# Patient Record
Sex: Female | Born: 1945
Health system: Southern US, Community
[De-identification: ages and names within clinical notes are randomized; demographics above are authoritative.]

## PROBLEM LIST (undated history)

## (undated) DIAGNOSIS — R011 Cardiac murmur, unspecified: Secondary | ICD-10-CM

## (undated) DIAGNOSIS — Z8739 Personal history of other diseases of the musculoskeletal system and connective tissue: Secondary | ICD-10-CM

## (undated) DIAGNOSIS — G8929 Other chronic pain: Secondary | ICD-10-CM

## (undated) DIAGNOSIS — R202 Paresthesia of skin: Secondary | ICD-10-CM

## (undated) DIAGNOSIS — R0602 Shortness of breath: Secondary | ICD-10-CM

## (undated) DIAGNOSIS — M1990 Unspecified osteoarthritis, unspecified site: Secondary | ICD-10-CM

## (undated) DIAGNOSIS — K219 Gastro-esophageal reflux disease without esophagitis: Secondary | ICD-10-CM

## (undated) DIAGNOSIS — I4891 Unspecified atrial fibrillation: Secondary | ICD-10-CM

## (undated) DIAGNOSIS — I739 Peripheral vascular disease, unspecified: Secondary | ICD-10-CM

## (undated) DIAGNOSIS — I8390 Asymptomatic varicose veins of unspecified lower extremity: Secondary | ICD-10-CM

## (undated) DIAGNOSIS — E538 Deficiency of other specified B group vitamins: Secondary | ICD-10-CM

## (undated) DIAGNOSIS — G589 Mononeuropathy, unspecified: Secondary | ICD-10-CM

## (undated) DIAGNOSIS — R0600 Dyspnea, unspecified: Secondary | ICD-10-CM

## (undated) DIAGNOSIS — T753XXA Motion sickness, initial encounter: Secondary | ICD-10-CM

## (undated) DIAGNOSIS — R0683 Snoring: Secondary | ICD-10-CM

## (undated) DIAGNOSIS — Z8679 Personal history of other diseases of the circulatory system: Secondary | ICD-10-CM

## (undated) DIAGNOSIS — R200 Anesthesia of skin: Secondary | ICD-10-CM

## (undated) DIAGNOSIS — M545 Low back pain: Secondary | ICD-10-CM

## (undated) DIAGNOSIS — E039 Hypothyroidism, unspecified: Secondary | ICD-10-CM

## (undated) DIAGNOSIS — M6740 Ganglion, unspecified site: Secondary | ICD-10-CM

## (undated) DIAGNOSIS — N3941 Urge incontinence: Secondary | ICD-10-CM

## (undated) DIAGNOSIS — I872 Venous insufficiency (chronic) (peripheral): Secondary | ICD-10-CM

## (undated) DIAGNOSIS — D649 Anemia, unspecified: Secondary | ICD-10-CM

## (undated) DIAGNOSIS — S46101A Unspecified injury of muscle, fascia and tendon of long head of biceps, right arm, initial encounter: Secondary | ICD-10-CM

## (undated) DIAGNOSIS — I499 Cardiac arrhythmia, unspecified: Secondary | ICD-10-CM

## (undated) DIAGNOSIS — S149XXA Injury of unspecified nerves of neck, initial encounter: Secondary | ICD-10-CM

## (undated) DIAGNOSIS — Z8719 Personal history of other diseases of the digestive system: Secondary | ICD-10-CM

## (undated) DIAGNOSIS — R29898 Other symptoms and signs involving the musculoskeletal system: Secondary | ICD-10-CM

## (undated) HISTORY — PX: OTHER SURGICAL HISTORY: SHX169

## (undated) HISTORY — PX: SPINE SURGERY: SHX786

## (undated) HISTORY — PX: DILATION AND CURETTAGE OF UTERUS: SHX78

## (undated) HISTORY — DX: Asymptomatic varicose veins of unspecified lower extremity: I83.90

## (undated) HISTORY — PX: LUMBAR LAMINECTOMY: SHX95

## (undated) HISTORY — PX: ABDOMINAL HYSTERECTOMY: SHX81

## (undated) HISTORY — PX: TONSILLECTOMY: SUR1361

---
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
## 1969-08-15 HISTORY — PX: OTHER SURGICAL HISTORY: SHX169

## 1979-08-16 HISTORY — PX: OTHER SURGICAL HISTORY: SHX169

## 2002-03-14 ENCOUNTER — Encounter: Payer: Self-pay | Admitting: Family Medicine

## 2002-03-14 ENCOUNTER — Encounter: Admission: RE | Admit: 2002-03-14 | Discharge: 2002-03-14 | Payer: Self-pay | Admitting: Family Medicine

## 2002-05-11 ENCOUNTER — Encounter: Payer: Self-pay | Admitting: *Deleted

## 2002-05-13 ENCOUNTER — Encounter (INDEPENDENT_AMBULATORY_CARE_PROVIDER_SITE_OTHER): Payer: Self-pay | Admitting: Specialist

## 2002-05-13 ENCOUNTER — Inpatient Hospital Stay (HOSPITAL_COMMUNITY): Admission: RE | Admit: 2002-05-13 | Discharge: 2002-05-14 | Payer: Self-pay | Admitting: *Deleted

## 2002-05-13 ENCOUNTER — Encounter: Payer: Self-pay | Admitting: *Deleted

## 2002-05-13 HISTORY — PX: LAPAROSCOPIC CHOLECYSTECTOMY: SUR755

## 2002-07-20 ENCOUNTER — Encounter: Payer: Self-pay | Admitting: Family Medicine

## 2002-07-20 ENCOUNTER — Encounter: Admission: RE | Admit: 2002-07-20 | Discharge: 2002-07-20 | Payer: Self-pay | Admitting: Family Medicine

## 2003-10-26 ENCOUNTER — Encounter: Admission: RE | Admit: 2003-10-26 | Discharge: 2003-10-26 | Payer: Self-pay | Admitting: Family Medicine

## 2004-01-17 ENCOUNTER — Other Ambulatory Visit: Admission: RE | Admit: 2004-01-17 | Discharge: 2004-01-17 | Payer: Self-pay | Admitting: Family Medicine

## 2004-01-30 ENCOUNTER — Encounter: Admission: RE | Admit: 2004-01-30 | Discharge: 2004-01-30 | Payer: Self-pay | Admitting: Family Medicine

## 2005-04-25 ENCOUNTER — Inpatient Hospital Stay (HOSPITAL_COMMUNITY): Admission: RE | Admit: 2005-04-25 | Discharge: 2005-04-27 | Payer: Self-pay | Admitting: Obstetrics and Gynecology

## 2005-04-25 ENCOUNTER — Encounter (INDEPENDENT_AMBULATORY_CARE_PROVIDER_SITE_OTHER): Payer: Self-pay | Admitting: Specialist

## 2005-04-25 HISTORY — PX: OTHER SURGICAL HISTORY: SHX169

## 2005-08-16 ENCOUNTER — Emergency Department (HOSPITAL_COMMUNITY): Admission: EM | Admit: 2005-08-16 | Discharge: 2005-08-16 | Payer: Self-pay | Admitting: Emergency Medicine

## 2007-09-18 ENCOUNTER — Emergency Department (HOSPITAL_COMMUNITY): Admission: EM | Admit: 2007-09-18 | Discharge: 2007-09-18 | Payer: Self-pay | Admitting: Emergency Medicine

## 2007-09-28 ENCOUNTER — Encounter: Admission: RE | Admit: 2007-09-28 | Discharge: 2007-09-28 | Payer: Self-pay | Admitting: Family Medicine

## 2007-11-21 ENCOUNTER — Encounter: Admission: RE | Admit: 2007-11-21 | Discharge: 2007-11-21 | Payer: Self-pay | Admitting: Family Medicine

## 2008-03-15 HISTORY — PX: CARDIOVASCULAR STRESS TEST: SHX262

## 2008-03-27 ENCOUNTER — Ambulatory Visit (HOSPITAL_COMMUNITY): Admission: RE | Admit: 2008-03-27 | Discharge: 2008-03-27 | Payer: Self-pay | Admitting: Orthopedic Surgery

## 2008-04-11 ENCOUNTER — Encounter (INDEPENDENT_AMBULATORY_CARE_PROVIDER_SITE_OTHER): Payer: Self-pay | Admitting: Orthopedic Surgery

## 2008-04-11 ENCOUNTER — Ambulatory Visit (HOSPITAL_COMMUNITY): Admission: RE | Admit: 2008-04-11 | Discharge: 2008-04-12 | Payer: Self-pay | Admitting: Orthopedic Surgery

## 2008-04-11 HISTORY — PX: ROTATOR CUFF REPAIR: SHX139

## 2008-07-11 ENCOUNTER — Encounter: Admission: RE | Admit: 2008-07-11 | Discharge: 2008-10-09 | Payer: Self-pay | Admitting: Orthopedic Surgery

## 2008-08-30 ENCOUNTER — Encounter: Admission: RE | Admit: 2008-08-30 | Discharge: 2008-08-30 | Payer: Self-pay | Admitting: Family Medicine

## 2009-09-03 ENCOUNTER — Encounter: Admission: RE | Admit: 2009-09-03 | Discharge: 2009-09-03 | Payer: Self-pay | Admitting: Obstetrics and Gynecology

## 2009-10-01 ENCOUNTER — Encounter: Admission: RE | Admit: 2009-10-01 | Discharge: 2009-10-01 | Payer: Self-pay | Admitting: Obstetrics and Gynecology

## 2009-10-11 HISTORY — PX: OTHER SURGICAL HISTORY: SHX169

## 2009-10-12 ENCOUNTER — Encounter (INDEPENDENT_AMBULATORY_CARE_PROVIDER_SITE_OTHER): Payer: Self-pay | Admitting: General Surgery

## 2009-10-12 ENCOUNTER — Encounter: Admission: RE | Admit: 2009-10-12 | Discharge: 2009-10-12 | Payer: Self-pay | Admitting: General Surgery

## 2009-10-12 ENCOUNTER — Ambulatory Visit (HOSPITAL_COMMUNITY): Admission: RE | Admit: 2009-10-12 | Discharge: 2009-10-12 | Payer: Self-pay | Admitting: General Surgery

## 2011-03-20 LAB — DIFFERENTIAL
Eosinophils Absolute: 0.1 10*3/uL (ref 0.0–0.7)
Eosinophils Relative: 2 % (ref 0–5)
Lymphocytes Relative: 36 % (ref 12–46)
Lymphs Abs: 2.3 10*3/uL (ref 0.7–4.0)
Monocytes Absolute: 0.6 10*3/uL (ref 0.1–1.0)
Monocytes Relative: 9 % (ref 3–12)

## 2011-03-20 LAB — BASIC METABOLIC PANEL
CO2: 25 mEq/L (ref 19–32)
Calcium: 9.2 mg/dL (ref 8.4–10.5)
Chloride: 107 mEq/L (ref 96–112)
GFR calc Af Amer: >60 mL/min (ref >60)
GFR calc non Af Amer: >60 mL/min (ref >60)
Potassium: 3.8 mEq/L (ref 3.5–5.1)
Sodium: 140 mEq/L (ref 135–145)

## 2011-03-20 LAB — CBC
Platelets: 217 10*3/uL (ref 150–400)
RBC: 4.22 MIL/uL (ref 3.87–5.11)

## 2011-04-29 NOTE — Op Note (Signed)
NAME:  Sydney Ayala, Sydney Ayala NO.:  [id number]   MEDICAL RECORD NO.:  [id number]          PATIENT TYPE:  OIB   LOCATION:  0098                         FACILITY:  [facility]   PHYSICIAN:  Marlowe Kays, M.D.  DATE OF BIRTH:  May 16, 1946   DATE OF PROCEDURE:  04/11/2008  DATE OF DISCHARGE:                               OPERATIVE REPORT   PREOPERATIVE DIAGNOSES:  1. Torn rotator cuff  2. Suspected lipoma in anterior proximal humerus, left shoulder.   POSTOPERATIVE DIAGNOSES:  1. Torn rotator cuff  2. Suspected lipoma in anterior proximal humerus, left shoulder.   OPERATION:  1. Anterior acromionectomy and repair of torn rotator cuff, left      shoulder.  2. Excision of lipoma anterior proximal humerus.   SURGEON:  Dr. Simonne Come.   ASSISTANT:  Mr. Idolina Primer, [place].   ANESTHESIA.:  General.   PATHOLOGY AND JUSTIFICATION FOR PROCEDURE:  Because of a painful left  shoulder, we had an MRI performed which demonstrated a torn rotator  cuff. Just prior surgery to surgery, she also asked me to excise the  mass on the anterior proximal humerus area which I suspected was a  lipoma.   PROCEDURE:  Prophylactic antibiotics, satisfactory general anesthesia,  beach-chair position on the Allen frame, the left shoulder girdle was  prepped with DuraPrep, draped in a sterile field and time-out performed.  The area of the lipoma was circled and the proposed skin incision marked  out and we then applied Ioban.  A vertical incision brought [place] a little  more anteriorly than normal to accommodate what appeared to be a  subscapularis tear as well as a supraspinatus tear and also the lipoma  excision.  I explored the deltoid for several centimeters and dissected  the fascia off the anterior acromion with cutting cautery.  The [facility] joint  was identified with a Mellody Dance needle and the anterior acromion osteotomy  site marked out with Bovie protecting the underlying rotator cuff. My  initial anterior  acromionectomy was performed.  She had a fairly  significant impingement problem and protecting the underlying rotator  cuff, I then performed additional resection of the undersurface of the  anterior acromion until the rotator cuff was widely decompressed. The  tear was identified, it was an oblique tear going from anterolateral to  posteromedial of about 5 cm in length to about 3 cm of retraction.  The  superior portion of the rotator cuff was badly damaged looking somewhat  like hamburger. After analyzing the tear, I roughened up the articular  cartilage just adjacent to the greater tuberosity and then used a  striker anchor which showed four leads to it and placed the four leads  evenly along the leading edge of the rotator cuff and then with the arm  slightly abducted reattached the rotator cuff in its anatomic position.  I supplemented the perimeter of the tear with multiple #1 Vicryl into  the lateral tissue including bursa and remnant of rotator cuff.  This  seemed to give a nice secure repair. Because the rotator cuff was quite  thick, I  did  not feel that she needed any grafting.  At the conclusion  of the case, I checked to make sure that there was no residual  impingement. The wound was then well irrigated with sterile saline and  the soft tissues infiltrated with 0.5% Marcaine with adrenaline.  I then  undermined the anterior flap and located the lipoma which was 5 cm in  length and perhaps 4 cm in width. This appear benign but I sent it to  pathology.  The wound was then closed in layers with interrupted #1  Vicryl and the fascia over top the residual acromion and then the  deltoid muscle. A combination of 2-0 Vicryl deep and 4-0 Vicryls  subcuticularly in the skin incision and Steri-Strips on the skin.  A dry  sterile dressing and shoulder immobilizer applied.  She tolerated the  procedure well and was taken to recovery in satisfactory with no known  complications.            ______________________________  Marlowe Kays, M.D.     JA/MEDQ  D:  04/11/2008  T:  04/11/2008  Job:  [id number]

## 2011-05-02 NOTE — Discharge Summary (Signed)
NAME:  Sydney Ayala, Sydney Ayala NO.:  [id number]   MEDICAL RECORD NO.:  [id number]          PATIENT TYPE:  INP   LOCATION:  0451                         FACILITY:  [facility]   PHYSICIAN:  Malachi Pro. Ambrose Mantle, M.D. DATE OF BIRTH:  [birth year]   DATE OF ADMISSION:  04/25/2005  DATE OF DISCHARGE:                                 DISCHARGE SUMMARY   This is a 65 year old white female admitted for vaginal hysterectomy, A&P  repair and obturator sling procedure because of uterine prolapse, fourth  degree cystocele, small rectocele and stress urinary incontinence.  The  patient underwent the procedure by Drs. Henley  and Ottelin on 04/25/05.  Blood loss about 200 cubic centimeters.  One vaginal pack was left.  Vaginal  pack was removed on the first postoperative day.  It was minimally stained.  The patient's postoperative course was without significant incident.  She  was able to void as soon as the catheter was removed, voided in large  amounts and felt she was emptying her bladder and on the second  postoperative day, was ready for discharge.  Initial hemoglobin 14.1,  hematocrit 41.7, white count 7100, platelet count 242,000.  Followup  hematocrit 39.1, 40.8.  Differential was normal.  Comprehensive metabolic  profile was within normal limits.  Urinalysis was negative.  EKG was normal.  Path report is not available at this time.   FINAL DIAGNOSES:  1.  Fourth degree cystocele.  2.  Uterine prolapse.  3.  Small rectocele.  4.  Stress urinary incontinence.   OPERATION:  Vaginal hysterectomy, A&P repair, sling procedure.   FINAL CONDITION:  Improved.   INSTRUCTIONS:  Regular discharge instructions.  No heavy lifting.  No  strenuous activity.  Call with any fever above 100.4 degrees, call with any  heavy vaginal bleeding, call with any unusual problems.   MEDICATIONS:  Resume Synthroid 150 mcg a day and Lexapro 10 mg a day.  Darvocet N-100, 20 tablets, 1 every 4-6 hours as needed for  pain is given at  discharge.   She is advised to return to see me in two weeks for followup examination and  if she is not instructed by urologist prior to discharge, she is to call Dr.  Margrett Rud office on 5/15 to see when he wants to see her in followup.      TFH/MEDQ  D:  04/27/2005  T:  04/27/2005  Job:  [id number]   cc:   Veverly Fells. Vernie Ammons, M.D.  [address]  [place]  [place] [address]  Fax: [phone]

## 2011-05-02 NOTE — Op Note (Signed)
NAME:  Sydney Ayala, Sydney Ayala                ACCOUNT NO.:  [id number]   MEDICAL RECORD NO.:  [id number]          PATIENT TYPE:  INP   LOCATION:  0001                         FACILITY:  [facility]   PHYSICIAN:  Mark C. Vernie Ammons, M.D.  DATE OF BIRTH:  1946/02/08   DATE OF PROCEDURE:  04/25/2005  DATE OF DISCHARGE:                                 OPERATIVE REPORT   PREOPERATIVE DIAGNOSES:  Stress urinary incontinence.   POSTOPERATIVE DIAGNOSES:  Stress urinary incontinence.   PROCEDURE:  Transobturator sling Conservation officer, historic buildings).   SURGEON:  Mark C. Vernie Ammons, M.D.   ANESTHESIA:  General.   DRAINS:  16-French Foley catheter.   BLOOD LOSS:  Less than 10 mL.   SPECIMENS:  None.   COMPLICATIONS:  None.   INDICATIONS:  The patient is 65 year old white female with mild stress  urinary incontinence who also has a significant cystocele and rectocele. She  is scheduled for transvaginal hysterectomy as well as anterior and posterior  repair by Dr. Ambrose Mantle. She has no irritative voiding symptoms. We discussed  sling procedure as well as its  risks and complications in combination with  her surgery by Dr. Ambrose Mantle she understands and has elected to proceed with  that.   DESCRIPTION OF OPERATION:  After informed consent, the patient was brought  to the major OR, placed on the table and administered general anesthesia.  Her genitalia, vagina and perineum were sterilely prepped and draped and Dr.  Ambrose Mantle proceeded with the transvaginal hysterectomy as well as anterior and  posterior repair. Prior to completing the closure on his anterior repair, I  proceeded with my portion of the operation.   I palpated the Foley catheter balloon and noted the mid urethral level.  Marcaine 0.5% with epinephrine was then used to infiltrate the subvaginal  mucosa and the incision extended further distally in the midline. I then  dissected lateral to the urethra on both sides. Attention was then directed  to the level of the  clitoris the skin incisions were made 5 mm lateral to  the clitoris on both sides. The bladder was completely drained with a 16-  [place] Foley catheter and the obturator trocars were then passed through the  skin incision and through the obturator fossa, behind the symphysis pubis  and then directed out at the mid urethral level through the vaginal  incision. This was performed on the left and then right sides. No  buttonholing of the vaginal mucosa was noted and I therefore attached the  sling material to the trocars and brought this back through the skin  incisions.   The Foley catheter was removed and cystoscopy was performed using a 21-  French sheath and 70 degree lens. The bladder was fully inspected and noted  to be free of any tumor, stones or inflammatory lesions. Ureteral orifices  were normal configuration and position. There was no evidence of bladder  injury or sling material seen.   The 16-French Foley catheter was then replaced and a forceps was placed  beneath the urethra and the tension adjusted on the sling material so that  it  was in apposition to the urethra but with no tension. The excess sling  material was cut at the skin level after irrigation with antibiotic solution  and the skin incisions were then closed with Dermabond.   Dr. Ambrose Mantle then closed the vaginal incision completely and the patient was  awakened and taken to recovery room in stable satisfactory condition. She  tolerated the procedure well with no intraoperative complications. She will  be observed in the hospital.      MCO/MEDQ  D:  04/25/2005  T:  04/25/2005  Job:  [id number]

## 2011-05-02 NOTE — H&P (Signed)
NAME:  Sydney Ayala, Sydney Ayala NO.:  [id number]   MEDICAL RECORD NO.:  [id number]          PATIENT TYPE:  INP   LOCATION:  NA                           FACILITY:  [facility]   PHYSICIAN:  Malachi Pro. Ambrose Mantle, M.D. DATE OF BIRTH:  12-23-1945   DATE OF ADMISSION:  04/25/2005  DATE OF DISCHARGE:                                HISTORY & PHYSICAL   This is a 65 year old divorced white female, para 2, 1-0-3, who was admitted  to the hospital for a vaginal hysterectomy, A&P repair, and sling procedure  because of fourth degree cystocele and second and third degree uterine  prolapse, mild stress urinary incontinence but incontinence of urine when  she bends over.  Last menstrual period several years ago.   This patient had back surgery quite a few years ago and subsequently seemed  to have incontinence of stool.  This gradually improved, and she seemed to  be in her normal state of health until several years ago when she noticed a  bulge in her vagina.  At approximately the end of 2005, she noted that  something would protrude from her vagina every day.  She noticed it all the  time.  She has some stress urinary incontinence and had some fecal urgency  incontinence.  Dr. Vernie Ammons evaluated her and felt like she would benefit  from a sling procedure, and she is now admitted for vaginal hysterectomy,  A&P repair, and sling procedure.  She has recently had some problem with  some leakage of stool, and her exam shows fairly weak sphincter, but I have  told her that this surgery will probably not impact on continence of stool.   PAST MEDICAL HISTORY:  No known allergies.   PAST SURGICAL HISTORY:  1.  Thyroid surgery.  2.  Left hand surgery.  3.  Groin surgery.  4.  Right breast surgery.  5.  Tonsillectomy.  6.  Jaw surgery.  7.  Two back surgeries.  8.  Three right foot spurs removed.  9.  Toenails removed.  10. D&C.  11. Hemorrhoidectomy.  12. Gallbladder surgery.   MEDICATIONS:  1.  Synthroid 150 mcg a day.  2.  Lexapro 10 mg a day.   ILLNESSES:  Patient reports thyroid dysfunction.  Also, she thinks that she  has mitral valve prolapse and claims that the heart valve leaks.  She has a  possible problem with sleep apnea, does not smoke or drink.   REVIEW OF SYSTEMS:  Occasional blurred vision.  Occasional shortness of  breath and dizziness.  She does leak her urine.   FAMILY HISTORY:  Mother died at 20 of congestive heart failure and black  lung.  Father died at 47 of black lung, heart problems, and hardening of the  arteries.  Three sisters living, one has cancer of the throat, one died at  12 of pneumonia.  She does not have any brothers.   PHYSICAL EXAMINATION:  VITAL SIGNS:  Weight 177-3/4 pounds.  Blood pressure  144/94.  Pulse 80.  GENERAL:  Well-developed and well-nourished white female.  HEENT:  No  cranial abnormalities.  Extraocular movements are intact.  Nose  and pharynx clear.  NECK:  Supple without  thyromegaly.  LUNGS:  Normal.  HEART:  Normal.  BREASTS:  Soft without masses.  There is vitiligo present on her skin.  ABDOMEN:  Soft and nontender.  No masses are palpable.  Liver, spleen, and  kidneys are not felt.  PELVIC:  Vulva and vagina show a fourth-degree cystocele and a second and  third-degree uterine prolapse.  The uterus is anterior and small.  Adnexa  are free of masses.  RECTAL:  No lesions but poor sphincter tone.   ADMISSION IMPRESSION:  Fourth-degree cystocele, uterine prolapse, stress  urinary incontinence.   The patient is admitted for vaginal hysterectomy, A&P repair, and sling  procedure.  She has not had sex in several years but does not want to lose  her ability to have sex.  She has been counseled about the risks of surgery,  including but not limited to heart attack, stroke, pulmonary embolus, wound  disruption, hemorrhoids with need for reoperation and/or transfusion,  fistula formation, nerve injury, and intestinal  obstruction.  The patient  understands and agrees to proceed.      TFH/MEDQ  D:  04/24/2005  T:  04/24/2005  Job:  [id number]   cc:   Veverly Fells. Vernie Ammons, M.D.  [address]  [place]  [place] [address]  Fax: [phone]

## 2011-05-02 NOTE — Op Note (Signed)
NAME:  Sydney Ayala, Sydney Ayala NO.:  [id number]   MEDICAL RECORD NO.:  [id number]          PATIENT TYPE:  INP   LOCATION:  0001                         FACILITY:  [facility]   PHYSICIAN:  Malachi Pro. Ambrose Mantle, M.D. DATE OF BIRTH:  Feb 26, 1946   DATE OF PROCEDURE:  04/25/2005  DATE OF DISCHARGE:                                 OPERATIVE REPORT   PREOPERATIVE DIAGNOSES:  Fourth-degree cystocele, smaller rectocele, second  to third-degree uterine prolapse, stress urinary incontinence.   POSTOPERATIVE DIAGNOSES:  Fourth-degree cystocele, smaller rectocele, second  to third-degree uterine prolapse, stress urinary incontinence.   OPERATION:  Vaginal hysterectomy, A&P repair, obturator sling.   SURGEON:  Malachi Pro. Ambrose Mantle, M.D.  and Veverly Fells. Vernie Ammons, M.D.   ASSISTANT:  Zenaida Niece, M.D.   ANESTHESIA:  General anesthesia.   DESCRIPTION OF PROCEDURE:  The patient was brought to the operating room and  placed under satisfactory general anesthesia. She was placed in lithotomy  position in the [place] stirrups. Exam revealed the fourth-degree cystocele,  moderate uterine prolapse, smaller rectocele. The adnexa were free of  masses. The vulva, vagina, urethra and perineum were prepped with Betadine  solution and draped as a sterile field. The cervix was drawn into the  operative field, the cervicovaginal junction was injected with a dilute  solution of Neo-Synephrine. A circumferential incision was made around the  cervix and with scissor dissection the anterior vaginal mucosa was separated  from the lower part of the anterior uterus. I then dissected the posterior  vaginal mucosa from the cul-de-sac, entered the cul-de-sac with sharp  dissection and clamped, cut and suture ligated the uterosacral ligaments  bilaterally and held them.  The cardinal ligaments were clamped, cut, suture  ligated. I entered the anterior peritoneum, retracted the bladder away, took  additional bites along  the broad ligament bilaterally, inverted the uterus  through the incision in the cul-de-sac and removed the uterus by transecting  the upper pedicles. The upper pedicles were doubly suture ligated. I sutured  the vaginal mucosa along the posterior cuff with a running suture of #0  Vicryl,  still had a little bit of bleeding that I controlled with  electrocautery. I then searched for hemostasis, there was no bleeding  present. I could not see the ovaries. I reperitonealized the pelvis with a  running suture of #1 Vicryl starting at 12 o'clock coming around through the  left upper pedicle, the left uterosacral ligament, the posterior cul-de-sac  peritoneum. The right uterosacral ligament in the right upper pedicle.  I  then placed a suture of #0 Vicryl through the uterosacral ligaments above  the pursestring, tied it down and then tied down the pursestring suture. I  dissected the anterior vaginal mucosa close to the urethra developing the  cystocele. I further developed the cystocele and then imbricated the  cystocele in the midline with interrupted sutures of #0 Vicryl, cut away  redundant vaginal mucosa and closed the vaginal mucosa at the cuff with  interrupted sutures of interrupted figure-of-eight sutures of #0 Vicryl  leaving room close to the urethra for Dr.  Ottelin to do the sling. I then  cut across the posterior vaginal fourchette, dissected the posterior vaginal  mucosa away from the rectocele all the way to about 2 cm from the vaginal  cuff, imbricated the rectocele in the midline, did a rectal exam to confirm  there was no rectal injury and then cut away redundant vaginal mucosa and  reapproximated the vaginal mucosa in the midline with multiple interrupted  figure-of-eight sutures of #0 Vicryl. The perineum was rebuilt with a  running 3-0 Vicryl. Dr. Vernie Ammons came in, performed his obturator sling which  he will dictate separately and then I closed the remainder of the anterior   vaginal mucosa with interrupted figure-of-eight sutures of #0 Vicryl. At the  beginning of the procedure well when  I would tug on the cervix, the  patient's heart rate with plummet. One time it got down to 17 beats a minute  but after being given medication, the heart rate remained stable throughout  the rest the case. The patient was returned to recovery in satisfactory  condition. Sponge and needle counts were correct. Blood loss was estimated a  200 mL. One vaginal pack was placed at the end of the procedure.      TFH/MEDQ  D:  04/25/2005  T:  04/25/2005  Job:  [id number]   cc:   Veverly Fells. Vernie Ammons, M.D.  [address]  [place]  [place] [address]  Fax: [phone]

## 2011-05-02 NOTE — Op Note (Signed)
[place]  Patient:    Sydney Ayala, Sydney Ayala Visit Number: [id number] MRN: [id number]          Service Type: SUR Location: 5700 [id number] Attending Physician:  Kandis Mannan Dictated by:   Donnie Coffin Samuella Cota, M.D. Proc. Date: 05/13/02 Admit Date:  05/13/2002   CC:         Maricela Bo, M.D.   Operative Report  CCS# [id number]  PREOPERATIVE DIAGNOSIS:  Chronic cholecystitis with cholelithiasis.  POSTOPERATIVE DIAGNOSIS:  Chronic cholecystitis with cholelithiasis.  OPERATION PERFORMED:  Laparoscopic cholecystectomy with intraoperative cholangiogram.  SURGEON:  Maisie Fus B. Samuella Cota, M.D.  ASSISTANT:  Zigmund Daniel, M.D.  ANESTHESIA:  General.  ANESTHESIOLOGIST:  Dr. Zoila Shutter and CRNA.  DESCRIPTION OF PROCEDURE:  The patient was taken to the operating room and placed on the table in supine position.  After satisfactory general anesthetic with intubation, the entire abdomen was prepped and draped as a sterile field. A small vertical infraumbilical incision was made through the skin and subcutaneous tissue in the midline fascia.  A finger was placed in the peritoneal cavity and there were no adhesions noted.  A pursestring suture of 0 Vicryl was placed and the Hasson trocar placed in the abdomen and the abdomen insufflated to  15 mHg pressure.  A second 10 mm trocar was placed just to the right of midline in the subxiphoid area.  Two 5 mm trocars were placed laterally.  The gallbladder was placed on traction.  There were some adhesions extending about up about one third of the way to the gallbladder. These were taken down.  The patient had a long cystic duct which was dissected free and a clip placed on the gallbladder side of the cystic duct.  A small opening was made in the cystic duct.  The [facility] cholangiocath was introduced initially medially and then again laterally through the anterior abdominal wall and placed into the cystic duct and  held in place with the endoclip. Using real-time C-arm fluoroscopy, cholangiogram was carried out which revealed good filling of the entire biliary system with good spillage into the duodenum.  No filling defects were noted.  Following the cholangiogram, the cholangiocath was removed and the cystic duct was triply clipped on the remaining side and divided.  The patient had a long cystic duct by cholangiogram.  The cystic artery was triply clipped on the remaining side, once on the gallbladder side and divided.  The gallbladder was dissected from the bed and another small artery was noted which was doubly clipped on the remaining side, once on the gallbladder side and divided.  The gallbladder was dissected in the bed.  There was some slight ooze at the last 1 cm of the gallbladder bed and this was controlled with the cautery.  The wound was copiously irrigated.  The irrigant was clear.  The gallbladder was then easily delivered through the infraumbilical incision.  The infraumbilical incision was tied with 0 Vicryl.  It was noted with further irrigation there was some bleeding from the gallbladder bed and the gallbladder bed at the edge of the liver was coagulated again with regular settings on the Bovie at 40.  This controlled the bleeding.  The irrigant was clear.  The two lateral trocar sites were observed as the trocars were removed.  There was no bleeding.  The abdomen was deflated and the second 10 mm trocar removed.  The two midline incisions were closed with running subcuticular sutures of 4-0  Vicryl and the two lateral trocar sites were closed with single simple 4-0 Vicryl subcuticular sutures.  Benzoin and half-inch Steri-Strips were applied to all wounds and also to the two areas where we did the percutaneous sticks for the cholangiocath.  0.25% Marcaine without epinephrine had been injected at all trocar sites.  Dry sterile dressings were applied.  The patient seemed to tolerate  the procedure well and was taken to the PACU in satisfactory condition. Dictated by:   Donnie Coffin Samuella Cota, M.D. Attending Physician:  Kandis Mannan DD:  05/13/02 TD:  05/14/02 Job: [id number]

## 2011-09-09 LAB — HEMOGLOBIN AND HEMATOCRIT, BLOOD: HCT: 42.6

## 2011-09-25 LAB — URINALYSIS, ROUTINE W REFLEX MICROSCOPIC
Glucose, UA: NEGATIVE
Specific Gravity, Urine: 1.024
Urobilinogen, UA: 0.2
pH: 5.5

## 2011-12-25 DIAGNOSIS — M5430 Sciatica, unspecified side: Secondary | ICD-10-CM | POA: Diagnosis not present

## 2011-12-25 DIAGNOSIS — M999 Biomechanical lesion, unspecified: Secondary | ICD-10-CM | POA: Diagnosis not present

## 2012-03-04 DIAGNOSIS — S90569A Insect bite (nonvenomous), unspecified ankle, initial encounter: Secondary | ICD-10-CM | POA: Diagnosis not present

## 2012-03-04 DIAGNOSIS — E039 Hypothyroidism, unspecified: Secondary | ICD-10-CM | POA: Diagnosis not present

## 2012-03-04 DIAGNOSIS — W57XXXA Bitten or stung by nonvenomous insect and other nonvenomous arthropods, initial encounter: Secondary | ICD-10-CM | POA: Diagnosis not present

## 2012-03-04 DIAGNOSIS — E785 Hyperlipidemia, unspecified: Secondary | ICD-10-CM | POA: Diagnosis not present

## 2012-03-05 DIAGNOSIS — E039 Hypothyroidism, unspecified: Secondary | ICD-10-CM | POA: Diagnosis not present

## 2012-03-05 DIAGNOSIS — E785 Hyperlipidemia, unspecified: Secondary | ICD-10-CM | POA: Diagnosis not present

## 2012-03-10 DIAGNOSIS — M5430 Sciatica, unspecified side: Secondary | ICD-10-CM | POA: Diagnosis not present

## 2012-03-10 DIAGNOSIS — M999 Biomechanical lesion, unspecified: Secondary | ICD-10-CM | POA: Diagnosis not present

## 2012-03-30 DIAGNOSIS — M999 Biomechanical lesion, unspecified: Secondary | ICD-10-CM | POA: Diagnosis not present

## 2012-03-30 DIAGNOSIS — M5430 Sciatica, unspecified side: Secondary | ICD-10-CM | POA: Diagnosis not present

## 2012-04-13 DIAGNOSIS — M999 Biomechanical lesion, unspecified: Secondary | ICD-10-CM | POA: Diagnosis not present

## 2012-04-13 DIAGNOSIS — M5430 Sciatica, unspecified side: Secondary | ICD-10-CM | POA: Diagnosis not present

## 2012-05-04 DIAGNOSIS — M5430 Sciatica, unspecified side: Secondary | ICD-10-CM | POA: Diagnosis not present

## 2012-05-04 DIAGNOSIS — M999 Biomechanical lesion, unspecified: Secondary | ICD-10-CM | POA: Diagnosis not present

## 2012-05-12 DIAGNOSIS — M6740 Ganglion, unspecified site: Secondary | ICD-10-CM | POA: Diagnosis not present

## 2012-05-28 DIAGNOSIS — M8560 Other cyst of bone, unspecified site: Secondary | ICD-10-CM | POA: Diagnosis not present

## 2012-06-01 ENCOUNTER — Other Ambulatory Visit: Payer: Self-pay | Admitting: Orthopedic Surgery

## 2012-06-02 ENCOUNTER — Encounter (HOSPITAL_BASED_OUTPATIENT_CLINIC_OR_DEPARTMENT_OTHER): Payer: Self-pay | Admitting: *Deleted

## 2012-06-02 DIAGNOSIS — M5430 Sciatica, unspecified side: Secondary | ICD-10-CM | POA: Diagnosis not present

## 2012-06-02 DIAGNOSIS — M999 Biomechanical lesion, unspecified: Secondary | ICD-10-CM | POA: Diagnosis not present

## 2012-06-03 ENCOUNTER — Encounter (HOSPITAL_BASED_OUTPATIENT_CLINIC_OR_DEPARTMENT_OTHER): Payer: Self-pay | Admitting: *Deleted

## 2012-06-03 NOTE — Progress Notes (Signed)
NPO AFTER MN. ARRIVES AT 0700. NEEDS HG AND EKG. WILL TAKE SYNTHROID AM OF SURG. W/ SIP OF WATER.

## 2012-06-09 ENCOUNTER — Encounter (HOSPITAL_BASED_OUTPATIENT_CLINIC_OR_DEPARTMENT_OTHER): Payer: Self-pay | Admitting: *Deleted

## 2012-06-09 ENCOUNTER — Encounter (HOSPITAL_BASED_OUTPATIENT_CLINIC_OR_DEPARTMENT_OTHER)
Admission: RE | Disposition: A | Discharge status: Home / Self Care | Payer: Self-pay | Source: Ambulatory Visit | Attending: Orthopedic Surgery

## 2012-06-09 ENCOUNTER — Ambulatory Visit (HOSPITAL_BASED_OUTPATIENT_CLINIC_OR_DEPARTMENT_OTHER)
Admission: RE | Admit: 2012-06-09 | Discharge: 2012-06-09 | Disposition: A | Discharge status: Home / Self Care | Payer: Medicare Other | Source: Ambulatory Visit | Attending: Orthopedic Surgery | Admitting: Orthopedic Surgery

## 2012-06-09 ENCOUNTER — Ambulatory Visit (HOSPITAL_BASED_OUTPATIENT_CLINIC_OR_DEPARTMENT_OTHER): Payer: Medicare Other | Admitting: Anesthesiology

## 2012-06-09 ENCOUNTER — Encounter (HOSPITAL_BASED_OUTPATIENT_CLINIC_OR_DEPARTMENT_OTHER): Payer: Self-pay | Admitting: Anesthesiology

## 2012-06-09 DIAGNOSIS — Z79899 Other long term (current) drug therapy: Secondary | ICD-10-CM | POA: Insufficient documentation

## 2012-06-09 DIAGNOSIS — K449 Diaphragmatic hernia without obstruction or gangrene: Secondary | ICD-10-CM | POA: Diagnosis not present

## 2012-06-09 DIAGNOSIS — M67449 Ganglion, unspecified hand: Secondary | ICD-10-CM

## 2012-06-09 DIAGNOSIS — K219 Gastro-esophageal reflux disease without esophagitis: Secondary | ICD-10-CM | POA: Diagnosis not present

## 2012-06-09 DIAGNOSIS — E039 Hypothyroidism, unspecified: Secondary | ICD-10-CM | POA: Insufficient documentation

## 2012-06-09 DIAGNOSIS — M6749 Ganglion, multiple sites: Secondary | ICD-10-CM | POA: Diagnosis not present

## 2012-06-09 DIAGNOSIS — M6740 Ganglion, unspecified site: Secondary | ICD-10-CM | POA: Insufficient documentation

## 2012-06-09 HISTORY — DX: Gastro-esophageal reflux disease without esophagitis: K21.9

## 2012-06-09 HISTORY — DX: Anesthesia of skin: R20.0

## 2012-06-09 HISTORY — DX: Personal history of other diseases of the musculoskeletal system and connective tissue: Z87.39

## 2012-06-09 HISTORY — DX: Snoring: R06.83

## 2012-06-09 HISTORY — DX: Paresthesia of skin: R20.2

## 2012-06-09 HISTORY — DX: Mononeuropathy, unspecified: G58.9

## 2012-06-09 HISTORY — DX: Hypothyroidism, unspecified: E03.9

## 2012-06-09 HISTORY — PX: GANGLION CYST EXCISION: SHX1691

## 2012-06-09 HISTORY — DX: Other chronic pain: G89.29

## 2012-06-09 HISTORY — DX: Low back pain: M54.5

## 2012-06-09 HISTORY — DX: Ganglion, unspecified site: M67.40

## 2012-06-09 HISTORY — DX: Personal history of other diseases of the digestive system: Z87.19

## 2012-06-09 HISTORY — DX: Other symptoms and signs involving the musculoskeletal system: R29.898

## 2012-06-09 HISTORY — DX: Personal history of other diseases of the circulatory system: Z86.79

## 2012-06-09 HISTORY — DX: Unspecified osteoarthritis, unspecified site: M19.90

## 2012-06-09 HISTORY — DX: Urge incontinence: N39.41

## 2012-06-09 HISTORY — DX: Shortness of breath: R06.02

## 2012-06-09 HISTORY — DX: Cardiac murmur, unspecified: R01.1

## 2012-06-09 HISTORY — DX: Injury of unspecified nerves of neck, initial encounter: S14.9XXA

## 2012-06-09 LAB — POCT HEMOGLOBIN-HEMACUE: Hemoglobin: 14.5 g/dL (ref 12.0–15.0)

## 2012-06-09 SURGERY — EXCISION, GANGLION CYST, WRIST
Anesthesia: General | Site: Hand | Laterality: Left | Wound class: Clean

## 2012-06-09 MED ORDER — LIDOCAINE HCL (CARDIAC) 20 MG/ML IV SOLN
INTRAVENOUS | Status: DC | PRN
  Administered 2012-06-09: 80 mg via INTRAVENOUS

## 2012-06-09 MED ORDER — KETOROLAC TROMETHAMINE 30 MG/ML IJ SOLN
INTRAMUSCULAR | Status: DC | PRN
  Administered 2012-06-09: 15 mg via INTRAVENOUS

## 2012-06-09 MED ORDER — SCOPOLAMINE 1 MG/3DAYS TD PT72
1.0000 | MEDICATED_PATCH | TRANSDERMAL | Status: DC
  Administered 2012-06-09: 1.5 mg via TRANSDERMAL

## 2012-06-09 MED ORDER — ACETAMINOPHEN 10 MG/ML IV SOLN
1000.0000 mg | Freq: Four times a day (QID) | INTRAVENOUS | Status: DC
  Administered 2012-06-09: 1000 mg via INTRAVENOUS

## 2012-06-09 MED ORDER — POVIDONE-IODINE 7.5 % EX SOLN: Freq: Once | CUTANEOUS | Status: DC

## 2012-06-09 MED ORDER — MIDAZOLAM HCL 5 MG/5ML IJ SOLN
INTRAMUSCULAR | Status: DC | PRN
  Administered 2012-06-09: 2 mg via INTRAVENOUS

## 2012-06-09 MED ORDER — FENTANYL CITRATE 0.05 MG/ML IJ SOLN
INTRAMUSCULAR | Status: DC | PRN
  Administered 2012-06-09: 50 ug via INTRAVENOUS

## 2012-06-09 MED ORDER — PROMETHAZINE HCL 25 MG/ML IJ SOLN: 6.2500 mg | INTRAMUSCULAR | Status: DC | PRN

## 2012-06-09 MED ORDER — BUPIVACAINE HCL 0.5 % IJ SOLN
INTRAMUSCULAR | Status: DC | PRN
  Administered 2012-06-09: 5 mL

## 2012-06-09 MED ORDER — MEPERIDINE HCL 50 MG PO TABS: 50.0000 mg | ORAL_TABLET | ORAL | Status: AC | PRN

## 2012-06-09 MED ORDER — DEXAMETHASONE SODIUM PHOSPHATE 4 MG/ML IJ SOLN
INTRAMUSCULAR | Status: DC | PRN
  Administered 2012-06-09: 10 mg via INTRAVENOUS

## 2012-06-09 MED ORDER — LACTATED RINGERS IV SOLN
INTRAVENOUS | Status: DC
  Administered 2012-06-09 (×2): via INTRAVENOUS

## 2012-06-09 MED ORDER — DROPERIDOL 2.5 MG/ML IJ SOLN
INTRAMUSCULAR | Status: DC | PRN
  Administered 2012-06-09: 0.625 mg via INTRAVENOUS

## 2012-06-09 MED ORDER — ONDANSETRON HCL 4 MG/2ML IJ SOLN
INTRAMUSCULAR | Status: DC | PRN
  Administered 2012-06-09: 4 mg via INTRAVENOUS

## 2012-06-09 MED ORDER — LACTATED RINGERS IV SOLN: INTRAVENOUS | Status: DC

## 2012-06-09 MED ORDER — PROPOFOL 10 MG/ML IV EMUL
INTRAVENOUS | Status: DC | PRN
  Administered 2012-06-09: 200 mg via INTRAVENOUS

## 2012-06-09 SURGICAL SUPPLY — 55 items
BANDAGE CONFORM 2  STR LF (GAUZE/BANDAGES/DRESSINGS) ×2 IMPLANT
BANDAGE CONFORM 3  STR LF (GAUZE/BANDAGES/DRESSINGS) ×2 IMPLANT
BANDAGE ELASTIC 3 VELCRO ST LF (GAUZE/BANDAGES/DRESSINGS) ×4 IMPLANT
BANDAGE ELASTIC 4 VELCRO ST LF (GAUZE/BANDAGES/DRESSINGS) ×2 IMPLANT
BLADE SURG 15 STRL LF DISP TIS (BLADE) ×1 IMPLANT
BLADE SURG 15 STRL SS (BLADE) ×1
BNDG ESMARK 4X9 LF (GAUZE/BANDAGES/DRESSINGS) ×2 IMPLANT
CLOTH BEACON ORANGE TIMEOUT ST (SAFETY) ×2 IMPLANT
CORDS BIPOLAR (ELECTRODE) ×2 IMPLANT
COVER TABLE BACK 60X90 (DRAPES) ×2 IMPLANT
DRAPE EXTREMITY T 121X128X90 (DRAPE) ×2 IMPLANT
DRAPE LG THREE QUARTER DISP (DRAPES) ×4 IMPLANT
DRAPE U-SHAPE 47X51 STRL (DRAPES) ×2 IMPLANT
DRSG EMULSION OIL 3X3 NADH (GAUZE/BANDAGES/DRESSINGS) ×2 IMPLANT
DURAPREP 26ML APPLICATOR (WOUND CARE) ×2 IMPLANT
ELECT REM PT RETURN 9FT ADLT (ELECTROSURGICAL)
ELECTRODE REM PT RTRN 9FT ADLT (ELECTROSURGICAL) IMPLANT
GLOVE BIOGEL PI IND STRL 8 (GLOVE) ×1 IMPLANT
GLOVE BIOGEL PI INDICATOR 8 (GLOVE) ×1
GLOVE ECLIPSE 6.5 STRL STRAW (GLOVE) ×2 IMPLANT
GLOVE ECLIPSE 8.0 STRL XLNG CF (GLOVE) ×2 IMPLANT
GLOVE INDICATOR 6.5 STRL GRN (GLOVE) ×2 IMPLANT
GLOVE INDICATOR 8.0 STRL GRN (GLOVE) ×2 IMPLANT
GOWN STRL NON-REIN LRG LVL3 (GOWN DISPOSABLE) ×2 IMPLANT
GOWN STRL REIN XL XLG (GOWN DISPOSABLE) ×2 IMPLANT
GOWN SURGICAL LARGE (GOWNS) ×2 IMPLANT
MARKER SKIN DUAL TIP RULER LAB (MISCELLANEOUS) ×2 IMPLANT
NEEDLE HYPO 22GX1.5 SAFETY (NEEDLE) IMPLANT
NEEDLE HYPO 25X1 1.5 SAFETY (NEEDLE) ×2 IMPLANT
NS IRRIG 500ML POUR BTL (IV SOLUTION) ×2 IMPLANT
PACK BASIN DAY SURGERY FS (CUSTOM PROCEDURE TRAY) ×2 IMPLANT
PAD CAST 3X4 CTTN HI CHSV (CAST SUPPLIES) ×1 IMPLANT
PADDING CAST ABS 4INX4YD NS (CAST SUPPLIES) ×1
PADDING CAST ABS COTTON 4X4 ST (CAST SUPPLIES) ×1 IMPLANT
PADDING CAST COTTON 3X4 STRL (CAST SUPPLIES) ×1
PENCIL BUTTON HOLSTER BLD 10FT (ELECTRODE) IMPLANT
SPLINT PLASTER CAST XFAST 3X15 (CAST SUPPLIES) IMPLANT
SPLINT PLASTER XTRA FASTSET 3X (CAST SUPPLIES)
SPONGE GAUZE 4X4 12PLY (GAUZE/BANDAGES/DRESSINGS) ×2 IMPLANT
STOCKINETTE 4X48 STRL (DRAPES) ×2 IMPLANT
SUT ETHILON 4 0 PS 2 18 (SUTURE) ×4 IMPLANT
SUT VIC AB 2-0 SH 27 (SUTURE)
SUT VIC AB 2-0 SH 27XBRD (SUTURE) IMPLANT
SUT VIC AB 2-0 UR6 27 (SUTURE) IMPLANT
SUT VIC AB 3-0 PS2 18 (SUTURE)
SUT VIC AB 3-0 PS2 18XBRD (SUTURE) IMPLANT
SUT VIC AB 3-0 SH 27 (SUTURE) ×1
SUT VIC AB 3-0 SH 27X BRD (SUTURE) ×1 IMPLANT
SUT VIC AB 4-0 SH 27 (SUTURE)
SUT VIC AB 4-0 SH 27XANBCTRL (SUTURE) IMPLANT
SYR BULB IRRIGATION 50ML (SYRINGE) ×2 IMPLANT
SYR CONTROL 10ML LL (SYRINGE) ×2 IMPLANT
TOWEL OR 17X24 6PK STRL BLUE (TOWEL DISPOSABLE) ×4 IMPLANT
UNDERPAD 30X30 INCONTINENT (UNDERPADS AND DIAPERS) ×2 IMPLANT
WATER STERILE IRR 500ML POUR (IV SOLUTION) ×2 IMPLANT

## 2012-06-09 NOTE — Progress Notes (Signed)
Patient states that she has no problem with betadine.

## 2012-06-09 NOTE — H&P (Signed)
Sydney Ayala is an 66 y.o. female.   Chief Complaint: painful lump lt long finger NWG:NFAOZHY of excision lump there  Years ago, now with recurrence  Past Medical History  Diagnosis Date  . Hypothyroidism   . Ganglion cyst LEFT RING FINGER  . Chronic low back pain LUMBAR    RELEIVE W/ TENS UNIT PRN AND CHRIOPACTOR  . Left hand weakness   . Numbness and tingling BILATERAL ARMS SECONDARY TO CERVICAL PINCHED NERVE  . Pinched nerve in neck   . Heart murmur ASYMPTOMATIC  . History of rheumatic fever CHILDHOOD  . Short of breath on exertion   . Arthritis BACK, SHOULDER  . H/O tenderness in limb BILATEARL LEGS AND HEELS DUE TO TENDINITIS AND SPURS  . Mild acid reflux   . H/O hiatal hernia   . Urge urinary incontinence   . Snores     Past Surgical History  Procedure Date  . Left breast ductal excision 10-11-2009    BENIGN  . Rotator cuff repair 04-11-2008    LEFT SHOULDER  . Vaginal hysterectomy/ anterior & posterior repair/ transobturator sling 04-25-2005    CYSTOCELE/ RECTOCELE/ UTERINE PROLAPSE/ SUI  . Laparoscopic cholecystectomy 05-13-2002  . Right breast lumpectomy     BENIGN  . Thyroid goiter removed 1970'S  . Dilation and curettage of uterus   . Hemrroidectomy and anal fissure repair 1965  . Left hand surg. 1980'S    EXTENSIVE REPAIR AND REMOVAL OF LIGAMENT/TENDON  (?CANCER)  . Removal right goin mass 1980'S    BENIGN  . Right foot surg X3    REPAIR OF 2 TOES  . Lumbar laminectomy 1990  &  1976    RIGHT SIDE L4 - 5  & L5 - S1  . Tonsillectomy CHILD  . Removal toe nailbed AGE 40    ALL 10 TOES-- DEFORMED  . Cardiovascular stress test APRIL 2009    NORMAL LVSF/ EF 61%/ NO ISCHEMIA    History reviewed. No pertinent family history. Social History:  reports that she quit smoking about 21 years ago. Her smoking use included Cigarettes. She quit after 40 years of use. She has never used smokeless tobacco. She reports that she does not drink alcohol or use illicit  drugs.  Allergies:  Allergies  Allergen Reactions  . Codeine Nausea And Vomiting  . Contrast Media (Iodinated Diagnostic Agents) Nausea And Vomiting and Other (See Comments)    "CONVULSIONS"  . Adhesive (Tape) Rash and Other (See Comments)    BURNS SKIN    Medications Prior to Admission  Medication Sig Dispense Refill  . levothyroxine (SYNTHROID, LEVOTHROID) 200 MCG tablet Take 200 mcg by mouth every morning.      . valACYclovir (VALTREX) 500 MG tablet Take 500 mg by mouth as needed.        Results for orders placed during the hospital encounter of 06/09/12 (from the past 48 hour(s))  POCT HEMOGLOBIN-HEMACUE     Status: Normal   Collection Time   06/09/12  7:38 AM      Component Value Range Comment   Hemoglobin 14.5  12.0 - 15.0 g/dL    No results found.  ROS  Blood pressure 125/76, pulse 76, temperature 97.8 F (36.6 C), temperature source Oral, resp. rate 18, height 5\' 6"  (1.676 m), weight 89.812 kg (198 lb), SpO2 95.00%. Physical Exam  Constitutional: She is oriented to person, place, and time. She appears well-developed and well-nourished.  HENT:  Head: Normocephalic and atraumatic.  Right Ear: External  ear normal.  Left Ear: External ear normal.  Eyes: Conjunctivae and EOM are normal. Pupils are equal, round, and reactive to light.  Neck: Normal range of motion. Neck supple.  Cardiovascular: Normal rate, regular rhythm, normal heart sounds and intact distal pulses.   Respiratory: Effort normal and breath sounds normal.  GI: Soft. Bowel sounds are normal.  Musculoskeletal: Normal range of motion. She exhibits tenderness.       Tender cystic lump radial side long finger knuckle,lt hand  Neurological: She is alert and oriented to person, place, and time. She has normal reflexes.  Skin: Skin is warm and dry.  Psychiatric: She has a normal mood and affect. Her behavior is normal. Judgment and thought content normal.     Assessment/Plan Painful ganglion cyst lt long  finger mp joint Excision above  Aryona Sill P 06/09/2012, 8:14 AM

## 2012-06-09 NOTE — Anesthesia Postprocedure Evaluation (Signed)
Anesthesia Post Note  Patient: Sydney Ayala  Procedure(s) Performed: Procedure(s) (LRB): REMOVAL GANGLION OF WRIST (Left)  Anesthesia type: General  Patient location: PACU  Post pain: Pain level controlled  Post assessment: Post-op Vital signs reviewed  Last Vitals:  Filed Vitals:   06/09/12 0930  BP: 120/60  Pulse: 71  Temp: 36.1 C  Resp: 12    Post vital signs: Reviewed  Level of consciousness: sedated  Complications: No apparent anesthesia complications

## 2012-06-09 NOTE — Transfer of Care (Signed)
Immediate Anesthesia Transfer of Care Note  Patient: Sydney Ayala  Procedure(s) Performed: Procedure(s) (LRB): REMOVAL GANGLION OF WRIST (Left)  Patient Location: PACU  Anesthesia Type: General  Level of Consciousness: awake, oriented, sedated and patient cooperative  Airway & Oxygen Therapy: Patient Spontanous Breathing and Patient connected to face mask oxygen  Post-op Assessment: Report given to PACU RN and Post -op Vital signs reviewed and stable  Post vital signs: Reviewed and stable  Complications: No apparent anesthesia complications

## 2012-06-09 NOTE — Discharge Instructions (Signed)

## 2012-06-09 NOTE — Brief Op Note (Signed)
06/09/2012  9:32 AM  PATIENT:  Sydney Ayala  66 y.o. female  PRE-OPERATIVE DIAGNOSIS:  Painful ganglion dorsum MP joint left long finger  POST-OPERATIVE DIAGNOSIS:  same  PROCEDURE:  Procedure(s) (LRB): REMOVAL GANGLION OF dorsal mp joint left long finger  SURGEON:  Surgeon(s) and Role:    * Drucilla Schmidt, MD - Primary  PHYSICIAN ASSISTANT:   ASSISTANTS:nurse  ANESTHESIA:   general  EBL:  Total I/O In: 1000 [I.V.:1000] Out: -   BLOOD ADMINISTERED:none  DRAINS: none   LOCAL MEDICATIONS USED:  MARCAINE     SPECIMEN:  Biopsy / Limited Resection  DISPOSITION OF SPECIMEN:  PATHOLOGY  COUNTS:  YES  TOURNIQUET:   Total Tourniquet Time Documented: Upper Arm (Left) - 37 minutes  DICTATION: .Other Dictation: Dictation Number [id number]  PLAN OF CARE: Discharge to home after PACU  PATIENT DISPOSITION:  PACU - hemodynamically stable.   Delay start of Pharmacological VTE agent (>24hrs) due to surgical blood loss or risk of bleeding: yes

## 2012-06-09 NOTE — Anesthesia Postprocedure Evaluation (Signed)
Anesthesia Post Note  Patient: Sydney Ayala  Procedure(s) Performed: Procedure(s) (LRB): REMOVAL GANGLION OF WRIST (Left)  Anesthesia type: General  Patient location: PACU  Post pain: Pain level controlled  Post assessment: Post-op Vital signs reviewed  Last Vitals:  Filed Vitals:   06/09/12 1036  BP: 126/75  Pulse: 69  Temp: 36 C  Resp: 16    Post vital signs: Reviewed  Level of consciousness: sedated  Complications: No apparent anesthesia complications

## 2012-06-09 NOTE — Anesthesia Preprocedure Evaluation (Addendum)
Anesthesia Evaluation  Patient identified by MRN, date of birth, ID band Patient awake    Reviewed: Allergy & Precautions, H&P , NPO status , Patient's Chart, lab work & pertinent test results  History of Anesthesia Complications (+) PONV  Airway Mallampati: II TM Distance: >3 FB Neck ROM: Full    Dental  (+) Teeth Intact, Chipped and Dental Advisory Given   Pulmonary shortness of breath,  breath sounds clear to auscultation  Pulmonary exam normal       Cardiovascular negative cardio ROS  + Valvular Problems/Murmurs Rhythm:Regular Rate:Normal     Neuro/Psych Chronic low back pain; "pinched nerve" in back of head; dizzyness negative neurological ROS  negative psych ROS   GI/Hepatic Neg liver ROS, hiatal hernia, GERD-  ,  Endo/Other  negative endocrine ROSHypothyroidism   Renal/GU negative Renal ROS  negative genitourinary   Musculoskeletal negative musculoskeletal ROS (+)   Abdominal   Peds  Hematology negative hematology ROS (+)   Anesthesia Other Findings   Reproductive/Obstetrics negative OB ROS                          Anesthesia Physical Anesthesia Plan  ASA: II  Anesthesia Plan: General   Post-op Pain Management:    Induction: Intravenous  Airway Management Planned: LMA  Additional Equipment:   Intra-op Plan:   Post-operative Plan: Extubation in OR  Informed Consent: I have reviewed the patients History and Physical, chart, labs and discussed the procedure including the risks, benefits and alternatives for the proposed anesthesia with the patient or authorized representative who has indicated his/her understanding and acceptance.   Dental advisory given  Plan Discussed with: CRNA  Anesthesia Plan Comments:         Anesthesia Quick Evaluation

## 2012-06-10 ENCOUNTER — Encounter (HOSPITAL_BASED_OUTPATIENT_CLINIC_OR_DEPARTMENT_OTHER): Payer: Self-pay | Admitting: Orthopedic Surgery

## 2012-06-10 NOTE — Progress Notes (Signed)
Patient did not get medication because of the way the RX was not hand written.   The pharmacy would not fill the medication.  She also had pressure over her finger.  Instructed to call doctors office.

## 2012-06-11 NOTE — Op Note (Signed)
NAME:  Sydney Ayala, Sydney Ayala NO.:  [id number]  MEDICAL RECORD NO.:  [id number]  LOCATION:                               FACILITY:  [facility]  PHYSICIAN:  Marlowe Kays, M.D.  DATE OF BIRTH:  December 10, 1946  DATE OF PROCEDURE:  06/09/2012 DATE OF DISCHARGE:                              OPERATIVE REPORT   PREOPERATIVE DIAGNOSIS:  Painful ganglion cyst, dorsal MP joint, left long finger.  POSTOPERATIVE DIAGNOSIS:  Painful ganglion cyst, dorsal MP joint, left long finger.  OPERATION:  Excision of ganglion cyst from the dorsal MP joint, left long finger.  SURGEON:  Marlowe Kays, MD  ASSISTANT:  Nurse.  ANESTHESIA:  General.  PATHOLOGY AND JUSTIFICATION FOR PROCEDURE:  Many years ago, she had had prior surgery in this area by someone else.  I do not have the details. Apparently, some concern about tumor possibly being present but pathology indicated that there was not a tumor.  Recently, she has developed a painful cystic area on the dorsal radial aspect of this joint.  X-rays have been normal and she is here today for surgical excision.  PROCEDURE:  Anesthesia felt that general anesthetic rather than IV regional would be more appropriate because of the possible duration of the case.  Pneumatic tourniquet applied to the left upper extremity was Esmarch out non sterilely and tourniquet inflated to 250 mmHg.  The left arm was then prepped from midforearm to fingertips, with DuraPrep draped in sterile field.  Time-out performed.  I made a vertical incision first to the radial side of the MP joint, left long finger just radial to the extensor tendon.  Incision was carried down to the cystic structure, which I dissected out, and this involved the capsule of the joint, which was quite thickened, presumably because of prior surgery.  The extensor tendon was preserved.  After excising the cyst, which I elected to send to Pathology because it was thick and there may be some  synovitis present.  I then irrigated the wound well, and was able to close the capsule after undermining either side adjacent soft tissue with multiple interrupted 3-0 Vicryl.  The wound was also infiltrated with plain Marcaine.  The skin and subcutaneous tissues were closed with interrupted 4-0 nylon mattress sutures, Betadine and Adaptic, dry sterile dressing, and a dorsal finger splint were applied.  She tolerated the procedure well, was taken to the recovery room in a satisfactory condition with no known complications.         ______________________________ Marlowe Kays, M.D.    JA/MEDQ  D:  06/09/2012  T:  06/09/2012  Job:  [id number]

## 2012-06-15 ENCOUNTER — Encounter (HOSPITAL_BASED_OUTPATIENT_CLINIC_OR_DEPARTMENT_OTHER): Payer: Self-pay

## 2012-06-23 DIAGNOSIS — M5430 Sciatica, unspecified side: Secondary | ICD-10-CM | POA: Diagnosis not present

## 2012-06-23 DIAGNOSIS — M999 Biomechanical lesion, unspecified: Secondary | ICD-10-CM | POA: Diagnosis not present

## 2012-07-21 DIAGNOSIS — M5430 Sciatica, unspecified side: Secondary | ICD-10-CM | POA: Diagnosis not present

## 2012-07-21 DIAGNOSIS — M999 Biomechanical lesion, unspecified: Secondary | ICD-10-CM | POA: Diagnosis not present

## 2012-08-25 DIAGNOSIS — M999 Biomechanical lesion, unspecified: Secondary | ICD-10-CM | POA: Diagnosis not present

## 2012-08-25 DIAGNOSIS — M5430 Sciatica, unspecified side: Secondary | ICD-10-CM | POA: Diagnosis not present

## 2012-09-02 DIAGNOSIS — E039 Hypothyroidism, unspecified: Secondary | ICD-10-CM | POA: Diagnosis not present

## 2012-09-02 DIAGNOSIS — E785 Hyperlipidemia, unspecified: Secondary | ICD-10-CM | POA: Diagnosis not present

## 2012-09-02 DIAGNOSIS — R55 Syncope and collapse: Secondary | ICD-10-CM | POA: Diagnosis not present

## 2012-09-06 ENCOUNTER — Other Ambulatory Visit: Payer: Self-pay | Admitting: Legal Medicine

## 2012-09-06 DIAGNOSIS — I658 Occlusion and stenosis of other precerebral arteries: Secondary | ICD-10-CM | POA: Diagnosis not present

## 2012-09-06 DIAGNOSIS — I6529 Occlusion and stenosis of unspecified carotid artery: Secondary | ICD-10-CM | POA: Diagnosis not present

## 2012-09-06 DIAGNOSIS — Z1231 Encounter for screening mammogram for malignant neoplasm of breast: Secondary | ICD-10-CM

## 2012-09-06 DIAGNOSIS — R55 Syncope and collapse: Secondary | ICD-10-CM | POA: Diagnosis not present

## 2012-09-16 ENCOUNTER — Other Ambulatory Visit: Payer: Self-pay | Admitting: Neurosurgery

## 2012-09-16 DIAGNOSIS — M5030 Other cervical disc degeneration, unspecified cervical region: Secondary | ICD-10-CM | POA: Diagnosis not present

## 2012-09-16 DIAGNOSIS — M542 Cervicalgia: Secondary | ICD-10-CM

## 2012-09-16 DIAGNOSIS — M47812 Spondylosis without myelopathy or radiculopathy, cervical region: Secondary | ICD-10-CM | POA: Diagnosis not present

## 2012-09-20 DIAGNOSIS — M5430 Sciatica, unspecified side: Secondary | ICD-10-CM | POA: Diagnosis not present

## 2012-09-20 DIAGNOSIS — M999 Biomechanical lesion, unspecified: Secondary | ICD-10-CM | POA: Diagnosis not present

## 2012-09-21 ENCOUNTER — Other Ambulatory Visit: Payer: Medicare Other

## 2012-09-23 ENCOUNTER — Ambulatory Visit
Admission: RE | Admit: 2012-09-23 | Discharge: 2012-09-23 | Disposition: A | Discharge status: Home / Self Care | Payer: Medicare Other | Source: Ambulatory Visit | Attending: Neurosurgery | Admitting: Neurosurgery

## 2012-09-23 ENCOUNTER — Ambulatory Visit
Admission: RE | Admit: 2012-09-23 | Discharge: 2012-09-23 | Disposition: A | Discharge status: Home / Self Care | Payer: Medicare Other | Source: Ambulatory Visit | Attending: Legal Medicine | Admitting: Legal Medicine

## 2012-09-23 DIAGNOSIS — M5020 Other cervical disc displacement, unspecified cervical region: Secondary | ICD-10-CM | POA: Diagnosis not present

## 2012-09-23 DIAGNOSIS — M542 Cervicalgia: Secondary | ICD-10-CM

## 2012-09-23 DIAGNOSIS — M4802 Spinal stenosis, cervical region: Secondary | ICD-10-CM | POA: Diagnosis not present

## 2012-09-23 DIAGNOSIS — Z1231 Encounter for screening mammogram for malignant neoplasm of breast: Secondary | ICD-10-CM

## 2012-09-24 DIAGNOSIS — G5600 Carpal tunnel syndrome, unspecified upper limb: Secondary | ICD-10-CM | POA: Diagnosis not present

## 2012-10-04 DIAGNOSIS — M47812 Spondylosis without myelopathy or radiculopathy, cervical region: Secondary | ICD-10-CM | POA: Diagnosis not present

## 2012-10-04 DIAGNOSIS — M542 Cervicalgia: Secondary | ICD-10-CM | POA: Diagnosis not present

## 2012-10-04 DIAGNOSIS — M5412 Radiculopathy, cervical region: Secondary | ICD-10-CM | POA: Diagnosis not present

## 2012-10-22 ENCOUNTER — Other Ambulatory Visit: Payer: Self-pay | Admitting: Orthopedic Surgery

## 2012-10-22 ENCOUNTER — Ambulatory Visit
Admission: RE | Admit: 2012-10-22 | Discharge: 2012-10-22 | Disposition: A | Discharge status: Home / Self Care | Payer: Medicare Other | Source: Ambulatory Visit | Attending: Orthopedic Surgery | Admitting: Orthopedic Surgery

## 2012-10-22 DIAGNOSIS — M719 Bursopathy, unspecified: Secondary | ICD-10-CM | POA: Diagnosis not present

## 2012-10-22 DIAGNOSIS — M25519 Pain in unspecified shoulder: Secondary | ICD-10-CM

## 2012-10-22 DIAGNOSIS — M67919 Unspecified disorder of synovium and tendon, unspecified shoulder: Secondary | ICD-10-CM | POA: Diagnosis not present

## 2012-10-25 ENCOUNTER — Other Ambulatory Visit: Payer: Self-pay | Admitting: Orthopedic Surgery

## 2012-10-25 DIAGNOSIS — M25512 Pain in left shoulder: Secondary | ICD-10-CM

## 2012-10-25 DIAGNOSIS — M25511 Pain in right shoulder: Secondary | ICD-10-CM

## 2012-10-28 DIAGNOSIS — M25519 Pain in unspecified shoulder: Secondary | ICD-10-CM | POA: Diagnosis not present

## 2013-01-24 DIAGNOSIS — Z23 Encounter for immunization: Secondary | ICD-10-CM | POA: Diagnosis not present

## 2013-07-25 DIAGNOSIS — E785 Hyperlipidemia, unspecified: Secondary | ICD-10-CM | POA: Diagnosis not present

## 2013-07-25 DIAGNOSIS — E039 Hypothyroidism, unspecified: Secondary | ICD-10-CM | POA: Diagnosis not present

## 2013-07-25 DIAGNOSIS — T148 Other injury of unspecified body region: Secondary | ICD-10-CM | POA: Diagnosis not present

## 2013-07-25 DIAGNOSIS — R55 Syncope and collapse: Secondary | ICD-10-CM | POA: Diagnosis not present

## 2013-07-25 DIAGNOSIS — W57XXXA Bitten or stung by nonvenomous insect and other nonvenomous arthropods, initial encounter: Secondary | ICD-10-CM | POA: Diagnosis not present

## 2013-09-12 DIAGNOSIS — Z23 Encounter for immunization: Secondary | ICD-10-CM | POA: Diagnosis not present

## 2014-06-15 DIAGNOSIS — L821 Other seborrheic keratosis: Secondary | ICD-10-CM | POA: Diagnosis not present

## 2014-06-15 DIAGNOSIS — Z6831 Body mass index (BMI) 31.0-31.9, adult: Secondary | ICD-10-CM | POA: Diagnosis not present

## 2014-06-15 DIAGNOSIS — E039 Hypothyroidism, unspecified: Secondary | ICD-10-CM | POA: Diagnosis not present

## 2014-07-11 DIAGNOSIS — Z0000 Encounter for general adult medical examination without abnormal findings: Secondary | ICD-10-CM | POA: Diagnosis not present

## 2014-07-11 DIAGNOSIS — R0789 Other chest pain: Secondary | ICD-10-CM | POA: Diagnosis not present

## 2014-07-11 DIAGNOSIS — E039 Hypothyroidism, unspecified: Secondary | ICD-10-CM | POA: Diagnosis not present

## 2014-07-11 DIAGNOSIS — E785 Hyperlipidemia, unspecified: Secondary | ICD-10-CM | POA: Diagnosis not present

## 2014-07-11 DIAGNOSIS — Z23 Encounter for immunization: Secondary | ICD-10-CM | POA: Diagnosis not present

## 2014-07-12 DIAGNOSIS — H2510 Age-related nuclear cataract, unspecified eye: Secondary | ICD-10-CM | POA: Diagnosis not present

## 2014-08-25 DIAGNOSIS — I10 Essential (primary) hypertension: Secondary | ICD-10-CM | POA: Diagnosis not present

## 2014-08-25 DIAGNOSIS — G609 Hereditary and idiopathic neuropathy, unspecified: Secondary | ICD-10-CM | POA: Diagnosis not present

## 2014-10-11 DIAGNOSIS — Z6830 Body mass index (BMI) 30.0-30.9, adult: Secondary | ICD-10-CM | POA: Diagnosis not present

## 2014-10-11 DIAGNOSIS — M5030 Other cervical disc degeneration, unspecified cervical region: Secondary | ICD-10-CM | POA: Diagnosis not present

## 2014-10-11 DIAGNOSIS — I10 Essential (primary) hypertension: Secondary | ICD-10-CM | POA: Diagnosis not present

## 2014-10-11 DIAGNOSIS — E785 Hyperlipidemia, unspecified: Secondary | ICD-10-CM | POA: Diagnosis not present

## 2014-10-11 DIAGNOSIS — Z23 Encounter for immunization: Secondary | ICD-10-CM | POA: Diagnosis not present

## 2014-10-11 DIAGNOSIS — G609 Hereditary and idiopathic neuropathy, unspecified: Secondary | ICD-10-CM | POA: Diagnosis not present

## 2014-10-16 DIAGNOSIS — M9903 Segmental and somatic dysfunction of lumbar region: Secondary | ICD-10-CM | POA: Diagnosis not present

## 2014-10-16 DIAGNOSIS — M5441 Lumbago with sciatica, right side: Secondary | ICD-10-CM | POA: Diagnosis not present

## 2014-10-19 DIAGNOSIS — M5441 Lumbago with sciatica, right side: Secondary | ICD-10-CM | POA: Diagnosis not present

## 2014-10-19 DIAGNOSIS — M9903 Segmental and somatic dysfunction of lumbar region: Secondary | ICD-10-CM | POA: Diagnosis not present

## 2014-11-28 DIAGNOSIS — M9903 Segmental and somatic dysfunction of lumbar region: Secondary | ICD-10-CM | POA: Diagnosis not present

## 2014-11-28 DIAGNOSIS — M5441 Lumbago with sciatica, right side: Secondary | ICD-10-CM | POA: Diagnosis not present

## 2015-01-23 DIAGNOSIS — Z6832 Body mass index (BMI) 32.0-32.9, adult: Secondary | ICD-10-CM | POA: Diagnosis not present

## 2015-01-23 DIAGNOSIS — L259 Unspecified contact dermatitis, unspecified cause: Secondary | ICD-10-CM | POA: Diagnosis not present

## 2015-04-18 DIAGNOSIS — I10 Essential (primary) hypertension: Secondary | ICD-10-CM | POA: Diagnosis not present

## 2015-04-18 DIAGNOSIS — E785 Hyperlipidemia, unspecified: Secondary | ICD-10-CM | POA: Diagnosis not present

## 2015-04-18 DIAGNOSIS — M5030 Other cervical disc degeneration, unspecified cervical region: Secondary | ICD-10-CM | POA: Diagnosis not present

## 2015-04-18 DIAGNOSIS — F329 Major depressive disorder, single episode, unspecified: Secondary | ICD-10-CM | POA: Diagnosis not present

## 2015-04-18 DIAGNOSIS — E039 Hypothyroidism, unspecified: Secondary | ICD-10-CM | POA: Diagnosis not present

## 2015-04-18 DIAGNOSIS — Z6831 Body mass index (BMI) 31.0-31.9, adult: Secondary | ICD-10-CM | POA: Diagnosis not present

## 2015-04-30 ENCOUNTER — Encounter: Payer: Self-pay | Admitting: Vascular Surgery

## 2015-04-30 ENCOUNTER — Other Ambulatory Visit: Payer: Self-pay

## 2015-04-30 DIAGNOSIS — I83892 Varicose veins of left lower extremities with other complications: Secondary | ICD-10-CM

## 2015-06-22 ENCOUNTER — Ambulatory Visit (INDEPENDENT_AMBULATORY_CARE_PROVIDER_SITE_OTHER): Payer: Medicare Other | Admitting: Vascular Surgery

## 2015-06-22 ENCOUNTER — Encounter: Payer: Self-pay | Admitting: Vascular Surgery

## 2015-06-22 ENCOUNTER — Ambulatory Visit (HOSPITAL_COMMUNITY)
Admission: RE | Admit: 2015-06-22 | Discharge: 2015-06-22 | Disposition: A | Discharge status: Home / Self Care | Payer: Medicare Other | Source: Ambulatory Visit | Attending: Vascular Surgery | Admitting: Vascular Surgery

## 2015-06-22 VITALS — BP 131/64 | HR 70 | Resp 16 | Ht 65.25 in | Wt 193.0 lb

## 2015-06-22 DIAGNOSIS — I83899 Varicose veins of unspecified lower extremities with other complications: Secondary | ICD-10-CM | POA: Insufficient documentation

## 2015-06-22 DIAGNOSIS — I83892 Varicose veins of left lower extremities with other complications: Secondary | ICD-10-CM | POA: Diagnosis not present

## 2015-06-22 DIAGNOSIS — I872 Venous insufficiency (chronic) (peripheral): Secondary | ICD-10-CM | POA: Insufficient documentation

## 2015-06-22 NOTE — Progress Notes (Signed)
Referred by:  Lillard Anes, MD  Reason for referral: Left varicose vein bleeding   History of Present Illness  Sydney Ayala is a 69 y.o. (01/31/1946) female who presents with chief complaint: bleeding from left shin varicose veins.  The patient has had an episode of poorly controlled bleeding from her left left shin varicose vein recently.  She denies other sx including swelling and pain associated with CVI.  The patient has chronic neuropathic pain in her feet felt due to spinal compression issues (s/p multiple back surgeries).  The patient has had no history of DVT, known history of pregnancy, know history of varicose vein, no history of venous stasis ulcers, no history of  Lymphedema and no history of skin changes in lower legs.  There is no family history of venous disorders.  The patient has never used compression stockings in the past.  Past Medical History  Diagnosis Date  . Hypothyroidism   . Ganglion cyst LEFT RING FINGER  . Chronic low back pain LUMBAR    RELEIVE W/ TENS UNIT PRN AND CHRIOPACTOR  . Left hand weakness   . Numbness and tingling BILATERAL ARMS SECONDARY TO CERVICAL PINCHED NERVE  . Pinched nerve in neck   . Heart murmur ASYMPTOMATIC  . History of rheumatic fever CHILDHOOD  . Short of breath on exertion   . Arthritis BACK, SHOULDER  . H/O tenderness in limb BILATEARL LEGS AND HEELS DUE TO TENDINITIS AND SPURS  . Mild acid reflux   . H/O hiatal hernia   . Urge urinary incontinence   . Snores     Past Surgical History  Procedure Laterality Date  . Left breast ductal excision  10-11-2009    BENIGN  . Rotator cuff repair  04-11-2008    LEFT SHOULDER  . Vaginal hysterectomy/ anterior & posterior repair/ transobturator sling  04-25-2005    CYSTOCELE/ RECTOCELE/ UTERINE PROLAPSE/ SUI  . Right breast lumpectomy      BENIGN  . Thyroid goiter removed  1970'S  . Dilation and curettage of uterus    . Hemrroidectomy and anal fissure repair  1965  .  Left hand surg.  1980'S    EXTENSIVE REPAIR AND REMOVAL OF LIGAMENT/TENDON  (?CANCER)  . Removal right goin mass  1980'S    BENIGN  . Right foot surg  X3    REPAIR OF 2 TOES  . Lumbar laminectomy  1990  &  1976    RIGHT SIDE L4 - 5  & L5 - S1  . Tonsillectomy  CHILD  . Removal toe nailbed  AGE 63    ALL 10 TOES-- DEFORMED  . Cardiovascular stress test  APRIL 2009    NORMAL LVSF/ EF 61%/ NO ISCHEMIA  . Ganglion cyst excision  06/09/2012    Procedure: REMOVAL GANGLION OF WRIST;  Surgeon: Magnus Sinning, MD;  Location: [place];  Service: Orthopedics;  Laterality: Left;  EXCISION OF GANGLION CYST LEFT RING FINGER  IV REGIONAL ANESTHESIA  . Abdominal hysterectomy    . Laparoscopic cholecystectomy  05-13-2002    Gall Bladder  . Spine surgery      History   Social History  . Marital Status: Divorced    Spouse Name: N/A  . Number of Children: N/A  . Years of Education: N/A   Occupational History  . Not on file.   Social History Main Topics  . Smoking status: Former Smoker -- 40 years    Types: Cigarettes  Quit date: 06/04/1991  . Smokeless tobacco: Never Used  . Alcohol Use: No  . Drug Use: No  . Sexual Activity: Not on file   Other Topics Concern  . Not on file   Social History Narrative    Family History  Problem Relation Age of Onset  . Deep vein thrombosis Mother     Right Leg  . Heart disease Mother     Before age 51-  CHF  . Varicose Veins Mother   . Heart attack Mother   . Heart disease Father     Before age 55-  PVD  . Heart attack Father   . Arthritis Father     Gout  . Hypertension Father   . Cancer Sister     Thyroid-Back  . Cancer Sister     Breast and Lung  . Heart disease Sister   . Heart disease Sister     Current Outpatient Prescriptions on File Prior to Visit  Medication Sig Dispense Refill  . levothyroxine (SYNTHROID, LEVOTHROID) 200 MCG tablet Take 125 mcg by mouth every morning. Take one tablet 125 mcg  Every  other day and then 150 mcg the next day    . triamcinolone cream (KENALOG) 0.1 % Apply 1 application topically 3 (three) times daily.    . valACYclovir (VALTREX) 500 MG tablet Take 500 mg by mouth as needed.    . pregabalin (LYRICA) 25 MG capsule Take 25 mg by mouth 2 (two) times daily.     No current facility-administered medications on file prior to visit.    Allergies  Allergen Reactions  . Latex Rash  . Codeine Nausea And Vomiting  . Contrast Media [Iodinated Diagnostic Agents] Nausea And Vomiting and Other (See Comments)    "CONVULSIONS"  . Adhesive [Tape] Rash and Other (See Comments)    BURNS SKIN    REVIEW OF SYSTEMS:  (Positives checked otherwise negative)  CARDIOVASCULAR:   [ ]  chest pain,  [x]  chest pressure,  [x]  palpitations,  [x]  shortness of breath when laying flat,  [ ]  shortness of breath with exertion,   [x]  pain in feet when walking,  [x]  pain in feet when laying flat, [ ]  history of blood clot in veins (DVT),  [ ]  history of phlebitis,  [x]  swelling in legs,  [ ]  varicose veins  PULMONARY:   [ ]  productive cough,  [ ]  asthma,  [ ]  wheezing  NEUROLOGIC:   [x]  weakness in arms or legs,  [x]  numbness in arms or legs,  [ ]  difficulty speaking or slurred speech,  [ ]  temporary loss of vision in one eye,  [x dizziness  HEMATOLOGIC:   [x]  bleeding problems,  [ ]  problems with blood clotting too easily  MUSCULOSKEL:   [ ]  joint pain, [ ]  joint swelling  GASTROINTEST:   [ ]  vomiting blood,  [ ]  blood in stool     GENITOURINARY:   [ ]  burning with urination,  [ ]  blood in urine  PSYCHIATRIC:   [ ]  history of major depression  INTEGUMENTARY:   [x]  rashes,  [ ]  ulcers  CONSTITUTIONAL:   [ ]  fever,  [x chills    Physical Examination Filed Vitals:   06/22/15 1132  BP: 131/64  Pulse: 70  Resp: 16  Height: 5' 5.25" (1.657 m)  Weight: 193 lb (87.544 kg)  SpO2: 97%   Body mass index is 31.88 kg/(m^2).  General: A&O x 3, WDWN,  mildly obese  Head: [place]/AT  Ear/Nose/Throat: Hearing grossly intact, nares w/o erythema or drainage, oropharynx w/o Erythema/Exudate  Eyes: PERRLA, EOMI  Neck: Supple, no nuchal rigidity, no palpable LAD  Pulmonary: Sym exp, good air movt, CTAB, no rales, rhonchi, & wheezing  Cardiac: RRR, Nl S1, S2, no Murmurs, rubs or gallops  Vascular: Vessel Right Left  Radial Palpable Palpable  Brachial Palpable Palpable  Carotid Palpable, without bruit Palpable, without bruit  Aorta Not palpable N/A  Femoral Palpable Palpable  Popliteal Not palpable Not palpable  PT Faintly faintly Faintly Palpable  DP Palpable Palpable   Gastrointestinal: soft, NTND, -G/R, - HSM, - masses, - CVAT B  Musculoskeletal: M/S 5/5 throughout , Extremities without ischemic changes , mild LLE LDS, faint varicosities in both legs, spider veins in both leg including L shin where bleeding occurred  Neurologic: CN 2-12 intact , Pain and light touch intact in extremities except decreased in feet, Motor exam as listed above  Psychiatric: Judgment intact, Mood & affect appropriate for pt's clinical situation  Dermatologic: See M/S exam for extremity exam, no rashes otherwise noted  Lymph : No Cervical, Axillary, or Inguinal lymphadenopathy    Non-Invasive Vascular Imaging  BLE Venous Insufficiency Duplex (Date: 06/22/2015):   RLE: no DVT and SVT, + GSV reflux: SFJ, distal to knee, + deep venous reflux: CFV, + SSV reflux  LLE: no DVT and SVT, + GSV reflux: SFJ, distal to proximal calf, + deep venous reflux: CFV   Outside Studies/Documentation 2 pages of outside documents were reviewed including: outpatient PCP chart.  Medical Decision Making  Sydney Ayala is a 69 y.o. female who presents with: BLE chronic venous insufficiency (C4), bleeding from varicose vein, neuropathic sx in both feet unrelated to CVI, family history of PAD   Based on the patient's history and examination, I recommend: compressive  therapy.  I discussed with the patient the use of her 20-30 mm thigh high compression stockings and need for 3 month trial of such.  The patient will follow up in 3 months with my partners in the [facility] for evaluation for: L GSV EVLA +/- sclerotherapy.  In regards to her history of PAD, she has less prominent PT pulses bilaterally, so I am ordering a BLE ABI to evaluate her arterial flow per pt's concerns given her father's poor outcome from his PAD.  Pt will follow up in 2-4 weeks for this study.  Thank you for allowing [place] to participate in this patient's care.  Adele Barthel, MD [facility] Office: [phone] Pager: [phone]  06/22/2015, 12:24 PM

## 2015-06-22 NOTE — Addendum Note (Signed)
Addended by: Reola Calkins on: 06/22/2015 03:59 PM   Modules accepted: Orders

## 2015-06-29 DIAGNOSIS — Z6831 Body mass index (BMI) 31.0-31.9, adult: Secondary | ICD-10-CM | POA: Diagnosis not present

## 2015-06-29 DIAGNOSIS — L509 Urticaria, unspecified: Secondary | ICD-10-CM | POA: Diagnosis not present

## 2015-07-19 ENCOUNTER — Encounter: Payer: Self-pay | Admitting: Vascular Surgery

## 2015-07-20 ENCOUNTER — Ambulatory Visit (HOSPITAL_COMMUNITY)
Admission: RE | Admit: 2015-07-20 | Discharge: 2015-07-20 | Disposition: A | Discharge status: Home / Self Care | Payer: Medicare Other | Source: Ambulatory Visit | Attending: Vascular Surgery | Admitting: Vascular Surgery

## 2015-07-20 ENCOUNTER — Encounter: Payer: Self-pay | Admitting: Vascular Surgery

## 2015-07-20 ENCOUNTER — Ambulatory Visit (INDEPENDENT_AMBULATORY_CARE_PROVIDER_SITE_OTHER): Payer: Medicare Other | Admitting: Vascular Surgery

## 2015-07-20 VITALS — BP 152/79 | HR 69 | Temp 98.0°F | Resp 16 | Ht 66.0 in | Wt 192.0 lb

## 2015-07-20 DIAGNOSIS — I83892 Varicose veins of left lower extremities with other complications: Secondary | ICD-10-CM

## 2015-07-20 DIAGNOSIS — I872 Venous insufficiency (chronic) (peripheral): Secondary | ICD-10-CM | POA: Insufficient documentation

## 2015-07-20 DIAGNOSIS — Z87891 Personal history of nicotine dependence: Secondary | ICD-10-CM | POA: Insufficient documentation

## 2015-07-20 NOTE — Progress Notes (Signed)
    Established Venous Insufficiency   History of Present Illness  Sydney Ayala is a 69 y.o. (1946-06-18) female who presents with chief complaint: return for ABI.  This patient with CVI and bleeding from L calf varicose veins presents for BLE ABI.  Pt's father had PAD resulting in an amputation.  She is scheduled for ABI due to some difficulty feeling pulses.  The patient sx continue to be more radicular in character than CVI.  The patient's PMH, PSH, SH, and FamHx are unchanged from 06/22/15.  Current Outpatient Prescriptions  Medication Sig Dispense Refill  . cetirizine (ZYRTEC) 10 MG tablet Take 10 mg by mouth daily. PRN  6  . levothyroxine (SYNTHROID, LEVOTHROID) 200 MCG tablet Take 125 mcg by mouth every morning. Take one tablet 125 mcg  Every other day and then 150 mcg the next day    . pregabalin (LYRICA) 25 MG capsule Take 25 mg by mouth 2 (two) times daily.    Marland Kitchen triamcinolone cream (KENALOG) 0.1 % Apply 1 application topically 3 (three) times daily.    . valACYclovir (VALTREX) 500 MG tablet Take 500 mg by mouth as needed.     No current facility-administered medications for this visit.    Allergies  Allergen Reactions  . Latex Rash  . Codeine Nausea And Vomiting  . Contrast Media [Iodinated Diagnostic Agents] Nausea And Vomiting and Other (See Comments)    "CONVULSIONS"  . Adhesive [Tape] Rash and Other (See Comments)    BURNS SKIN    On ROS today: continue neuropathy thigh sx, no bleeding from varicosities   Physical Examination  Filed Vitals:   07/20/15 1540 07/20/15 1547  BP: 149/84 152/79  Pulse: 70 69  Temp: 98 F (36.7 C)   TempSrc: Oral   Resp: 16   Height: 5\' 6"  (1.676 m)   Weight: 192 lb (87.091 kg)   SpO2: 100%    Body mass index is 31 kg/(m^2).  General: A&O x 3, WDWN  Pulmonary: Sym exp, good air movt, CTAB, no rales, rhonchi, & wheezing  Cardiac: RRR, Nl S1, S2, no Murmurs, rubs or gallops  Vascular: Vessel Right Left  Radial Palpable  Palpable  Brachial Palpable Palpable  Carotid Palpable, without bruit Palpable, without bruit  Aorta Not palpable N/A  Femoral Palpable Palpable  Popliteal Not palpable Not palpable  PT Faintly Palpable Faintly Palpable  DP Easily Palpable Easily Palpable   Gastrointestinal: soft, NTND, no G/R, no HSM, - masses, no CVAT B  Musculoskeletal: M/S 5/5 throughout , Extremities without ischemic changes, varicosities in both legs, spider veins present B, mild LDS L>R  Neurologic: Pain and light touch intact in extremities , Motor exam as listed above  Non-Invasive Vascular Imaging  ABI (Date: 07/20/2015)  R: 1.19, DP: tri, PT: tri, TBI: 0.79  L: 1.07, DP: tri, PT: tri, TBI: 0.83    Medical Decision Making  Sydney Ayala is a 69 y.o. female who presents with: B leg chronic venous insufficiency (C4), bleeding from L varicose vein, neuropathic sx in both legs, no evidence of PAD   Again, pt is undergoing trial of compression stockings.  She will follow up in 3 months evaluation of L GSV EVLA +/- sclerotherapy to prevent another bleeding episode.  Thank you for allowing [place] to participate in this patient's care.   Adele Barthel, MD [facility] Office: [phone] Pager: [phone]  07/20/2015, 4:58 PM

## 2015-09-03 DIAGNOSIS — M5441 Lumbago with sciatica, right side: Secondary | ICD-10-CM | POA: Diagnosis not present

## 2015-09-03 DIAGNOSIS — M9903 Segmental and somatic dysfunction of lumbar region: Secondary | ICD-10-CM | POA: Diagnosis not present

## 2015-09-22 DIAGNOSIS — Z23 Encounter for immunization: Secondary | ICD-10-CM | POA: Diagnosis not present

## 2015-09-26 ENCOUNTER — Telehealth: Payer: Self-pay | Admitting: *Deleted

## 2015-09-26 NOTE — Telephone Encounter (Signed)
Sydney Ayala calling regarding her upcoming follow up appointment with Sydney Ayala scheduled for 10-02-2015.  Explained to her that 3 month VV follow up on 10-02-2015 would simply be an office visit and that she would not be having an office procedure/surgery that day.  Answered her questions regarding office surgery anesthesia, need for a driver on the day of office surgery, and her concerns regarding bleeding from lower extremity varicosities which she has experienced in the recent past.  Advised her if she experienced bleeding from lower extremity varicosities to lay down, elevate her leg above the level of her heart, and use direct compression using a single finger over the bleeding varicosity for 5-10 minutes/until the bleeding stopped. Advised her after bleeding stopped, to cover the area with a gauze pad or padded band aide and then to Ace wrap over the area for added compression.  Sydney Ayala verbalized understanding and appeared reassured.  Encouraged her to call [facility] if she has further questions or concerns.

## 2015-09-27 ENCOUNTER — Encounter: Payer: Self-pay | Admitting: Vascular Surgery

## 2015-10-02 ENCOUNTER — Encounter: Payer: Self-pay | Admitting: Vascular Surgery

## 2015-10-02 ENCOUNTER — Ambulatory Visit (INDEPENDENT_AMBULATORY_CARE_PROVIDER_SITE_OTHER): Payer: Medicare Other | Admitting: Vascular Surgery

## 2015-10-02 VITALS — BP 144/83 | HR 89 | Temp 97.9°F | Resp 16 | Ht 65.5 in | Wt 193.0 lb

## 2015-10-02 DIAGNOSIS — I83899 Varicose veins of unspecified lower extremities with other complications: Secondary | ICD-10-CM | POA: Insufficient documentation

## 2015-10-02 DIAGNOSIS — I83893 Varicose veins of bilateral lower extremities with other complications: Secondary | ICD-10-CM | POA: Diagnosis not present

## 2015-10-02 NOTE — Progress Notes (Signed)
Problems with Activities of Daily Living Secondary to Leg Pain  1. Sydney Ayala has difficulty with activities that require prolonged standing (cooking, cleaning, shopping) due to leg pain.    2. Sydney Ayala states she has difficulty traveling in car due to leg pain.       Failure of  Conservative Therapy:  1. Worn 20-30 mm Hg thigh high compression hose >3 months with no relief of symptoms.  2. Frequently elevates legs-no relief of symptoms  3. Taken Ibuprofen 600 Mg TID with no relief of symptoms.   Patient reports continued heaviness in Sydney Ayala left leg despite compression garments. Portions she has had no further bleeding. She did have several episodes of bleeding from colonic telangiectasia above Sydney Ayala ankle on the anterior lateral aspect of Sydney Ayala left leg.  I very long discussion with Sydney Ayala regarding options for treatment. I did reimage Sydney Ayala veins with SonoSite ultrasound this does show reflux in the saphenous vein extending towards this area telangiectasia and bleeding. Have recommended left leg great saphenous vein and sclerotherapy including area of telangiectasia for reduction risk for bleeding. I did explain the procedure as an outpatient procedure local anesthesia taking approximately 1 hour. Did explain the slight risk of DVT associated with procedure as well. She has other medical issues regarding potential cervical disc surgery. Explained this is certainly not urgent. Would recommend addressing these other issues. She'll notify us when she wished to proceed with Sydney Ayala surgery

## 2015-10-02 NOTE — Progress Notes (Signed)
Filed Vitals:   10/02/15 1608 10/02/15 1610  BP: 144/85 144/83  Pulse: 89   Temp: 97.9 F (36.6 C)   TempSrc: Oral   Resp: 16   Height: 5' 5.5" (1.664 m)   Weight: 193 lb (87.544 kg)   SpO2: 96%

## 2015-10-17 DIAGNOSIS — Z6831 Body mass index (BMI) 31.0-31.9, adult: Secondary | ICD-10-CM | POA: Diagnosis not present

## 2015-10-17 DIAGNOSIS — K140 Glossitis: Secondary | ICD-10-CM | POA: Diagnosis not present

## 2015-10-24 DIAGNOSIS — I10 Essential (primary) hypertension: Secondary | ICD-10-CM | POA: Diagnosis not present

## 2015-10-24 DIAGNOSIS — E039 Hypothyroidism, unspecified: Secondary | ICD-10-CM | POA: Diagnosis not present

## 2015-10-24 DIAGNOSIS — E785 Hyperlipidemia, unspecified: Secondary | ICD-10-CM | POA: Diagnosis not present

## 2015-10-24 DIAGNOSIS — Z6831 Body mass index (BMI) 31.0-31.9, adult: Secondary | ICD-10-CM | POA: Diagnosis not present

## 2015-10-24 DIAGNOSIS — G609 Hereditary and idiopathic neuropathy, unspecified: Secondary | ICD-10-CM | POA: Diagnosis not present

## 2016-02-29 DIAGNOSIS — Z6831 Body mass index (BMI) 31.0-31.9, adult: Secondary | ICD-10-CM | POA: Diagnosis not present

## 2016-02-29 DIAGNOSIS — J029 Acute pharyngitis, unspecified: Secondary | ICD-10-CM | POA: Diagnosis not present

## 2016-04-03 ENCOUNTER — Emergency Department: Payer: Medicare Other

## 2016-04-03 ENCOUNTER — Encounter: Payer: Self-pay | Admitting: Emergency Medicine

## 2016-04-03 ENCOUNTER — Inpatient Hospital Stay
Admission: EM | Admit: 2016-04-03 | Discharge: 2016-04-05 | DRG: 310 | Disposition: A | Discharge status: Home / Self Care | Payer: Medicare Other | Attending: Internal Medicine | Admitting: Internal Medicine

## 2016-04-03 DIAGNOSIS — G8929 Other chronic pain: Secondary | ICD-10-CM | POA: Diagnosis present

## 2016-04-03 DIAGNOSIS — K921 Melena: Secondary | ICD-10-CM | POA: Insufficient documentation

## 2016-04-03 DIAGNOSIS — E039 Hypothyroidism, unspecified: Secondary | ICD-10-CM | POA: Diagnosis present

## 2016-04-03 DIAGNOSIS — I872 Venous insufficiency (chronic) (peripheral): Secondary | ICD-10-CM | POA: Diagnosis present

## 2016-04-03 DIAGNOSIS — Z91041 Radiographic dye allergy status: Secondary | ICD-10-CM | POA: Diagnosis not present

## 2016-04-03 DIAGNOSIS — R42 Dizziness and giddiness: Secondary | ICD-10-CM | POA: Diagnosis not present

## 2016-04-03 DIAGNOSIS — Z91040 Latex allergy status: Secondary | ICD-10-CM | POA: Diagnosis not present

## 2016-04-03 DIAGNOSIS — Z87891 Personal history of nicotine dependence: Secondary | ICD-10-CM | POA: Diagnosis not present

## 2016-04-03 DIAGNOSIS — R1115 Cyclical vomiting syndrome unrelated to migraine: Secondary | ICD-10-CM | POA: Insufficient documentation

## 2016-04-03 DIAGNOSIS — Z8249 Family history of ischemic heart disease and other diseases of the circulatory system: Secondary | ICD-10-CM | POA: Diagnosis not present

## 2016-04-03 DIAGNOSIS — I4891 Unspecified atrial fibrillation: Secondary | ICD-10-CM

## 2016-04-03 DIAGNOSIS — Z885 Allergy status to narcotic agent status: Secondary | ICD-10-CM

## 2016-04-03 DIAGNOSIS — R404 Transient alteration of awareness: Secondary | ICD-10-CM | POA: Diagnosis not present

## 2016-04-03 DIAGNOSIS — Z79899 Other long term (current) drug therapy: Secondary | ICD-10-CM | POA: Diagnosis not present

## 2016-04-03 DIAGNOSIS — A084 Viral intestinal infection, unspecified: Secondary | ICD-10-CM | POA: Diagnosis present

## 2016-04-03 DIAGNOSIS — R634 Abnormal weight loss: Secondary | ICD-10-CM | POA: Diagnosis present

## 2016-04-03 DIAGNOSIS — R112 Nausea with vomiting, unspecified: Secondary | ICD-10-CM | POA: Diagnosis not present

## 2016-04-03 DIAGNOSIS — R197 Diarrhea, unspecified: Secondary | ICD-10-CM | POA: Diagnosis not present

## 2016-04-03 DIAGNOSIS — R109 Unspecified abdominal pain: Secondary | ICD-10-CM | POA: Diagnosis not present

## 2016-04-03 DIAGNOSIS — N912 Amenorrhea, unspecified: Secondary | ICD-10-CM | POA: Diagnosis not present

## 2016-04-03 DIAGNOSIS — M545 Low back pain: Secondary | ICD-10-CM | POA: Diagnosis present

## 2016-04-03 DIAGNOSIS — G43A1 Cyclical vomiting, intractable: Secondary | ICD-10-CM | POA: Diagnosis not present

## 2016-04-03 DIAGNOSIS — K219 Gastro-esophageal reflux disease without esophagitis: Secondary | ICD-10-CM | POA: Diagnosis present

## 2016-04-03 DIAGNOSIS — I480 Paroxysmal atrial fibrillation: Secondary | ICD-10-CM | POA: Diagnosis not present

## 2016-04-03 DIAGNOSIS — K922 Gastrointestinal hemorrhage, unspecified: Secondary | ICD-10-CM | POA: Insufficient documentation

## 2016-04-03 DIAGNOSIS — D649 Anemia, unspecified: Secondary | ICD-10-CM | POA: Diagnosis not present

## 2016-04-03 DIAGNOSIS — I739 Peripheral vascular disease, unspecified: Secondary | ICD-10-CM | POA: Diagnosis not present

## 2016-04-03 LAB — TSH: TSH: 6.561 u[IU]/mL — ABNORMAL HIGH (ref 0.350–4.500)

## 2016-04-03 LAB — COMPREHENSIVE METABOLIC PANEL
ALBUMIN: 4.3 g/dL (ref 3.5–5.0)
ALT: 15 U/L (ref 14–54)
ANION GAP: 6 (ref 5–15)
AST: 24 U/L (ref 15–41)
Alkaline Phosphatase: 51 U/L (ref 38–126)
BILIRUBIN TOTAL: 1.8 mg/dL — AB (ref 0.3–1.2)
BUN: 18 mg/dL (ref 6–20)
CHLORIDE: 109 mmol/L (ref 101–111)
CO2: 25 mmol/L (ref 22–32)
Calcium: 8.8 mg/dL — ABNORMAL LOW (ref 8.9–10.3)
Creatinine, Ser: 0.73 mg/dL (ref 0.44–1.00)
GFR calc Af Amer: >60 mL/min (ref >60)
GFR calc non Af Amer: >60 mL/min (ref >60)
GLUCOSE: 106 mg/dL — AB (ref 65–99)
POTASSIUM: 3.4 mmol/L — AB (ref 3.5–5.1)
SODIUM: 140 mmol/L (ref 135–145)
TOTAL PROTEIN: 6.8 g/dL (ref 6.5–8.1)

## 2016-04-03 LAB — CBC WITH DIFFERENTIAL/PLATELET
BASOS ABS: 0 10*3/uL (ref 0–0.1)
BASOS PCT: 0 %
EOS ABS: 0.1 10*3/uL (ref 0–0.7)
Eosinophils Relative: 2 %
HEMATOCRIT: 24.7 % — AB (ref 35.0–47.0)
Hemoglobin: 8.6 g/dL — ABNORMAL LOW (ref 12.0–16.0)
Lymphocytes Relative: 42 %
Lymphs Abs: 1.5 10*3/uL (ref 1.0–3.6)
MCH: 42.8 pg — ABNORMAL HIGH (ref 26.0–34.0)
MCHC: 34.7 g/dL (ref 32.0–36.0)
MCV: 123.3 fL — ABNORMAL HIGH (ref 80.0–100.0)
MONO ABS: 0.2 10*3/uL (ref 0.2–0.9)
Monocytes Relative: 5 %
NEUTROS ABS: 1.9 10*3/uL (ref 1.4–6.5)
NEUTROS PCT: 51 %
Platelets: 169 10*3/uL (ref 150–440)
RBC: 2 MIL/uL — ABNORMAL LOW (ref 3.80–5.20)
RDW: 20.4 % — AB (ref 11.5–14.5)
WBC: 3.7 10*3/uL (ref 3.6–11.0)

## 2016-04-03 LAB — URINALYSIS COMPLETE WITH MICROSCOPIC (ARMC ONLY)
Bilirubin Urine: NEGATIVE
Glucose, UA: NEGATIVE mg/dL
HGB URINE DIPSTICK: NEGATIVE
LEUKOCYTES UA: NEGATIVE
NITRITE: NEGATIVE
PH: 6 (ref 5.0–8.0)
PROTEIN: NEGATIVE mg/dL
SPECIFIC GRAVITY, URINE: 1.006 (ref 1.005–1.030)

## 2016-04-03 LAB — TROPONIN I

## 2016-04-03 LAB — LIPASE, BLOOD: Lipase: 17 U/L (ref 11–51)

## 2016-04-03 LAB — PROTIME-INR
INR: 1.16
PROTHROMBIN TIME: 15 s (ref 11.4–15.0)

## 2016-04-03 LAB — MAGNESIUM: MAGNESIUM: 1.9 mg/dL (ref 1.7–2.4)

## 2016-04-03 LAB — APTT: aPTT: 25 seconds (ref 24–36)

## 2016-04-03 MED ORDER — SODIUM CHLORIDE 0.9 % IV BOLUS (SEPSIS)
1000.0000 mL | Freq: Once | INTRAVENOUS | Status: AC
  Administered 2016-04-03: 1000 mL via INTRAVENOUS

## 2016-04-03 MED ORDER — DILTIAZEM HCL 100 MG IV SOLR
5.0000 mg/h | Freq: Once | INTRAVENOUS | Status: AC
  Administered 2016-04-03: 5 mg/h via INTRAVENOUS
  Filled 2016-04-03: qty 100

## 2016-04-03 MED ORDER — BARIUM SULFATE 2.1 % PO SUSP
450.0000 mL | ORAL | Status: AC
  Administered 2016-04-03 (×2): 450 mL via ORAL

## 2016-04-03 MED ORDER — ONDANSETRON HCL 4 MG/2ML IJ SOLN
4.0000 mg | Freq: Once | INTRAMUSCULAR | Status: AC
  Administered 2016-04-03: 4 mg via INTRAVENOUS

## 2016-04-03 MED ORDER — DILTIAZEM HCL 25 MG/5ML IV SOLN
15.0000 mg | Freq: Once | INTRAVENOUS | Status: AC
  Administered 2016-04-03: 15 mg via INTRAVENOUS
  Filled 2016-04-03: qty 5

## 2016-04-03 MED ORDER — ONDANSETRON HCL 4 MG/2ML IJ SOLN
INTRAMUSCULAR | Status: AC
  Administered 2016-04-03: 4 mg via INTRAVENOUS
  Filled 2016-04-03: qty 2

## 2016-04-03 NOTE — ED Notes (Signed)
Pt states v/d x 2 months on and off - ems went out earlier but pt refused transport - now has afib with rvr

## 2016-04-03 NOTE — ED Notes (Signed)
Pt taken to CT.

## 2016-04-03 NOTE — H&P (Signed)
[facility] [place] NAME: Sydney Ayala    MR#:  [id number]  DATE OF BIRTH:  Jan 05, 1946  DATE OF ADMISSION:  04/03/2016  PRIMARY CARE PHYSICIAN: Lillard Anes, MD   REQUESTING/REFERRING PHYSICIAN: Clearnce Hasten, MD  CHIEF COMPLAINT:   Chief Complaint  Patient presents with  . Emesis  . Diarrhea    HISTORY OF PRESENT ILLNESS:  Sydney Ayala  is a 70 y.o. female who presents with Persistent diarrhea for the last several weeks to months, as well as development of nausea and vomiting today. She's had increasing weakness over the past several weeks as well. She had a significant episode of lightheadedness today which prompted her to come to the ED for evaluation. Here she was found to be significantly anemic with a hemoglobin of 8.6, last known value in our system was from a couple years ago but was well within normal range and 14. She was also found to be in atrial fibrillation with RVR, she states she has never had a diagnosis of atrial fibrillation before. She was noted to be guaiac positive in the ED. She reports having significant episodes of shortness of breath along with her weakness for the past several weeks. Hospitalists were called for admission.  PAST MEDICAL HISTORY:   Past Medical History  Diagnosis Date  . Hypothyroidism   . Ganglion cyst LEFT RING FINGER  . Chronic low back pain LUMBAR    RELEIVE W/ TENS UNIT PRN AND CHRIOPACTOR  . Left hand weakness   . Numbness and tingling BILATERAL ARMS SECONDARY TO CERVICAL PINCHED NERVE  . Pinched nerve in neck   . Heart murmur ASYMPTOMATIC  . History of rheumatic fever CHILDHOOD  . Short of breath on exertion   . Arthritis BACK, SHOULDER  . H/O tenderness in limb BILATEARL LEGS AND HEELS DUE TO TENDINITIS AND SPURS  . Mild acid reflux   . H/O hiatal hernia   . Urge urinary incontinence   . Snores   . Varicose veins     Left Leg    PAST SURGICAL HISTORY:   Past  Surgical History  Procedure Laterality Date  . Left breast ductal excision  10-11-2009    BENIGN  . Rotator cuff repair  04-11-2008    LEFT SHOULDER  . Vaginal hysterectomy/ anterior & posterior repair/ transobturator sling  04-25-2005    CYSTOCELE/ RECTOCELE/ UTERINE PROLAPSE/ SUI  . Right breast lumpectomy      BENIGN  . Thyroid goiter removed  1970'S  . Dilation and curettage of uterus    . Hemrroidectomy and anal fissure repair  1965  . Left hand surg.  1980'S    EXTENSIVE REPAIR AND REMOVAL OF LIGAMENT/TENDON  (?CANCER)  . Removal right goin mass  1980'S    BENIGN  . Right foot surg  X3    REPAIR OF 2 TOES  . Lumbar laminectomy  1990  &  1976    RIGHT SIDE L4 - 5  & L5 - S1  . Tonsillectomy  CHILD  . Removal toe nailbed  AGE 81    ALL 10 TOES-- DEFORMED  . Cardiovascular stress test  APRIL 2009    NORMAL LVSF/ EF 61%/ NO ISCHEMIA  . Ganglion cyst excision  06/09/2012    Procedure: REMOVAL GANGLION OF WRIST;  Surgeon: Magnus Sinning, MD;  Location: [facility];  Service: Orthopedics;  Laterality: Left;  EXCISION OF GANGLION CYST LEFT RING FINGER  IV REGIONAL ANESTHESIA  .  Abdominal hysterectomy    . Laparoscopic cholecystectomy  05-13-2002    Gall Bladder  . Spine surgery      SOCIAL HISTORY:   Social History  Substance Use Topics  . Smoking status: Former Smoker -- 40 years    Types: Cigarettes    Quit date: 06/04/1991  . Smokeless tobacco: Never Used  . Alcohol Use: No    FAMILY HISTORY:   Family History  Problem Relation Age of Onset  . Deep vein thrombosis Mother     Right Leg  . Heart disease Mother     Before age 42-  CHF  . Varicose Veins Mother   . Heart attack Mother   . Heart disease Father     Before age 54-  PVD  . Heart attack Father   . Arthritis Father     Gout  . Hypertension Father   . Cancer Sister     Thyroid-Back  . Cancer Sister     Breast and Lung  . Heart disease Sister   . Heart disease Sister     DRUG  ALLERGIES:   Allergies  Allergen Reactions  . Latex Rash  . Codeine Nausea And Vomiting  . Contrast Media [Iodinated Diagnostic Agents] Nausea And Vomiting and Other (See Comments)    "CONVULSIONS"  . Adhesive [Tape] Rash and Other (See Comments)    BURNS SKIN    MEDICATIONS AT HOME:   Prior to Admission medications   Medication Sig Start Date End Date Taking? Authorizing Provider  acetaminophen (TYLENOL) 325 MG tablet Take 650 mg by mouth every 6 (six) hours as needed for moderate pain or headache.   Yes Historical Provider, MD  levothyroxine (SYNTHROID, LEVOTHROID) 125 MCG tablet Take 125 mcg by mouth every other day. To alternate with 150mcg dose   Yes Historical Provider, MD  levothyroxine (SYNTHROID, LEVOTHROID) 150 MCG tablet Take 150 mcg by mouth every other day. To alternate with 171mcg dose   Yes Historical Provider, MD    REVIEW OF SYSTEMS:  Review of Systems  Constitutional: Negative for fever, chills, weight loss and malaise/fatigue.  HENT: Negative for ear pain, hearing loss and tinnitus.   Eyes: Negative for blurred vision, double vision, pain and redness.  Respiratory: Negative for cough, hemoptysis and shortness of breath.   Cardiovascular: Negative for chest pain, palpitations, orthopnea and leg swelling.       Lightheadedness  Gastrointestinal: Positive for nausea, vomiting, abdominal pain and diarrhea. Negative for constipation.  Genitourinary: Negative for dysuria, frequency and hematuria.  Musculoskeletal: Negative for back pain, joint pain and neck pain.  Skin:       No acne, rash, or lesions  Neurological: Positive for weakness. Negative for dizziness, tremors and focal weakness.  Endo/Heme/Allergies: Negative for polydipsia. Does not bruise/bleed easily.  Psychiatric/Behavioral: Negative for depression. The patient is not nervous/anxious and does not have insomnia.      VITAL SIGNS:   Filed Vitals:   04/03/16 2200 04/03/16 2230 04/03/16 2300 04/03/16  2345  BP: 114/66 100/65 106/89 109/73  Pulse: 58 49 86 67  Temp:      TempSrc:      Resp: 9 25 20 11   Height:      Weight:      SpO2: 96% 94% 95% 94%   Wt Readings from Last 3 Encounters:  04/03/16 87.091 kg (192 lb)  10/02/15 87.544 kg (193 lb)  07/20/15 87.091 kg (192 lb)    PHYSICAL EXAMINATION:  Physical Exam  Vitals reviewed.  Constitutional: She is oriented to person, place, and time. She appears well-developed and well-nourished. No distress.  HENT:  Head: Normocephalic and atraumatic.  Mouth/Throat: Oropharynx is clear and moist.  Eyes: Conjunctivae and EOM are normal. Pupils are equal, round, and reactive to light. No scleral icterus.  Neck: Normal range of motion. Neck supple. No JVD present. No thyromegaly present.  Cardiovascular: Intact distal pulses.  Exam reveals no gallop and no friction rub.   No murmur heard. Tachycardic with an irregular rhythm  Respiratory: Effort normal and breath sounds normal. No respiratory distress. She has no wheezes. She has no rales.  GI: Soft. She exhibits no distension. There is tenderness (mild epigastric tenderness).  Hyperactive bowel sounds  Musculoskeletal: Normal range of motion. She exhibits no edema.  No arthritis, no gout  Lymphadenopathy:    She has no cervical adenopathy.  Neurological: She is alert and oriented to person, place, and time. No cranial nerve deficit.  No dysarthria, no aphasia  Skin: Skin is warm and dry. No rash noted. No erythema.  Psychiatric: She has a normal mood and affect. Her behavior is normal. Judgment and thought content normal.    LABORATORY PANEL:   CBC  Recent Labs Lab 04/03/16 2028  WBC 3.7  HGB 8.6*  HCT 24.7*  PLT 169   ------------------------------------------------------------------------------------------------------------------  Chemistries   Recent Labs Lab 04/03/16 2028  NA 140  K 3.4*  CL 109  CO2 25  GLUCOSE 106*  BUN 18  CREATININE 0.73  CALCIUM 8.8*   MG 1.9  AST 24  ALT 15  ALKPHOS 51  BILITOT 1.8*   ------------------------------------------------------------------------------------------------------------------  Cardiac Enzymes  Recent Labs Lab 04/03/16 2028  TROPONINI <0.03   ------------------------------------------------------------------------------------------------------------------  RADIOLOGY:  Ct Abdomen Pelvis Wo Contrast  04/03/2016  CLINICAL DATA:  Right-sided abdominal pain with vomiting and diarrhea for 2 months. Oral contrast only due to IV contrast allergy. EXAM: CT ABDOMEN AND PELVIS WITHOUT CONTRAST TECHNIQUE: Multidetector CT imaging of the abdomen and pelvis was performed following the standard protocol without IV contrast. COMPARISON:  None. FINDINGS: Mild dependent atelectasis in the lung bases. There is a 7 mm noncalcified nodule with the left lung base anteriorly. Non-contrast chest CT at 6-12 months is recommended. If the nodule is stable at time of repeat CT, then future CT at 18-24 months (from today's scan) is considered optional for low-risk patients, but is recommended for high-risk patients. This recommendation follows the consensus statement: Guidelines for Management of Incidental Pulmonary Nodules Detected on CT Images:From the Fleischner Society 2017; published online before print (10.1148/radiol.SG:5268862). Residual contrast material in the lower esophagus may represent reflux or dysmotility. Surgical absence of the gallbladder. No bile duct dilatation. The unenhanced appearance of the liver, spleen, pancreas, adrenal glands, kidneys, abdominal aorta, inferior vena cava, and retroperitoneal lymph nodes is unremarkable. Scattered mesenteric axis and celiac axis lymph nodes are not pathologically enlarged. Stomach, small bowel, and colon are not abnormally distended. Contrast material flows through to the colon without evidence of bowel obstruction. No free air or free fluid in the abdomen. Pelvis:  Appendix is normal. Bladder wall is mildly thickened. There is a small amount of gas in the bladder. In the absence of instrumentation, this is likely due to cystitis. Uterus is surgically absent. No abnormal adnexal masses. No pelvic mass or lymphadenopathy. No free or loculated pelvic fluid collections. Degenerative changes in the spine. No destructive bone lesions. IMPRESSION: 7 mm noncalcified nodule left lung base anteriorly. See above followup recommendations. Residual contrast  material in the lower esophagus may represent reflux or dysmotility. Bladder wall thickening and small amount of gas in the bladder suggest cystitis. Electronically Signed   By: Lucienne Capers M.D.   On: 04/03/2016 23:40   Dg Chest 2 View  04/03/2016  CLINICAL DATA:  Nausea and vomiting for 2 months EXAM: CHEST  2 VIEW COMPARISON:  None. FINDINGS: The heart size and mediastinal contours are within normal limits. Both lungs are clear. The visualized skeletal structures are unremarkable. IMPRESSION: No active cardiopulmonary disease. Electronically Signed   By: Inez Catalina M.D.   On: 04/03/2016 20:57    EKG:   Orders placed or performed during the hospital encounter of 04/03/16  . ED EKG  . ED EKG    IMPRESSION AND PLAN:  Principal Problem:   Paroxysmal atrial fibrillation with RVR ([place]) - patient responded some to initial Cardizem IV bolus but her heart rate was still not at goal, and due to her blood pressure she was initiated on a slower infusion of Cardizem. We will admit her to the telemetry floor, up titration of her Cardizem as her blood pressure permits in order to control her heart rate. Echocardiogram ordered. Cardiology consult the morning. We are holding off on any anticoagulation at this time due to the question of possible GI bleeding. Active Problems:   Nausea, vomiting and diarrhea - unclear etiology. Patient states she initially had a "stomach flu" a couple months ago. After that time she has had  episodes of diarrhea. This is gotten worse over the last 2-3 weeks. Today she had some nausea and vomiting. C. difficile and GI panel sent from the ED. Patient was started on Cipro and Flagyl. CT scan questioned esophageal reflux or dysmotility, but did not find any acute evidence of GI pathology. Patient has complained some of them like her food was "getting stuck "in her epigastric region.   Amenia - unclear etiology, she is guaiac positive but with no report of any overt bleed or melena. She certainly seems to report some symptoms from her anemia. However, do not feel like she needs a blood transfusion at this time. We ordered a GI consult to work this up further.   Hypothyroidism - continue home dose thyroid replacement   GERD (gastroesophageal reflux disease) - initiate PPI  All the records are reviewed and case discussed with ED provider. Management plans discussed with the patient and/or family.  DVT PROPHYLAXIS: Mechanical only  GI PROPHYLAXIS: None  ADMISSION STATUS: Inpatient  CODE STATUS: Full Code Status History    This patient does not have a recorded code status. Please follow your organizational policy for patients in this situation.      TOTAL TIME TAKING CARE OF THIS PATIENT: 50 minutes.    Sydney Ayala 04/03/2016, 11:53 PM  [facility]  Office  [phone]  CC: Primary care physician; Lillard Anes, MD

## 2016-04-03 NOTE — ED Provider Notes (Signed)
[facility] Emergency Department Provider Note  ____________________________________________  Time seen: Seen upon arrival to the emergency department  I have reviewed the triage vital signs and the nursing notes.   HISTORY  Chief Complaint Emesis and Diarrhea   HPI Sydney Ayala is a 70 y.o. female with a history of hypothyroidism who is presenting to the emergency department today with weakness and diarrhea over the past 2 months. She says that she became acutely weak today and has had 9-10 episodes of diarrhea today with 1 episode of bilious vomiting en route to the hospital. She says that she has had some shortness of breath with increased weakness as well. Denies any recent antibiotics that she was given a one-time dose of antibiotics at her primary care doctor's office about 2 months ago. She says she thinks is all started when she had a "stomach flu." Ever since then she has had intermittent diarrhea. Also with decreased appetite and an unintended 8 pound weight loss over the past 2 months. Denies any blood in her stool. Was given Zofran en route.   Past Medical History  Diagnosis Date  . Hypothyroidism   . Ganglion cyst LEFT RING FINGER  . Chronic low back pain LUMBAR    RELEIVE W/ TENS UNIT PRN AND CHRIOPACTOR  . Left hand weakness   . Numbness and tingling BILATERAL ARMS SECONDARY TO CERVICAL PINCHED NERVE  . Pinched nerve in neck   . Heart murmur ASYMPTOMATIC  . History of rheumatic fever CHILDHOOD  . Short of breath on exertion   . Arthritis BACK, SHOULDER  . H/O tenderness in limb BILATEARL LEGS AND HEELS DUE TO TENDINITIS AND SPURS  . Mild acid reflux   . H/O hiatal hernia   . Urge urinary incontinence   . Snores   . Varicose veins     Left Leg    Patient Active Problem List   Diagnosis Date Noted  . Varicose veins of leg with complications [id number]  . Bleeding from varicose vein 06/22/2015  . Chronic venous insufficiency 06/22/2015     Past Surgical History  Procedure Laterality Date  . Left breast ductal excision  10-11-2009    BENIGN  . Rotator cuff repair  04-11-2008    LEFT SHOULDER  . Vaginal hysterectomy/ anterior & posterior repair/ transobturator sling  04-25-2005    CYSTOCELE/ RECTOCELE/ UTERINE PROLAPSE/ SUI  . Right breast lumpectomy      BENIGN  . Thyroid goiter removed  1970'S  . Dilation and curettage of uterus    . Hemrroidectomy and anal fissure repair  1965  . Left hand surg.  1980'S    EXTENSIVE REPAIR AND REMOVAL OF LIGAMENT/TENDON  (?CANCER)  . Removal right goin mass  1980'S    BENIGN  . Right foot surg  X3    REPAIR OF 2 TOES  . Lumbar laminectomy  1990  &  1976    RIGHT SIDE L4 - 5  & L5 - S1  . Tonsillectomy  CHILD  . Removal toe nailbed  AGE 53    ALL 10 TOES-- DEFORMED  . Cardiovascular stress test  APRIL 2009    NORMAL LVSF/ EF 61%/ NO ISCHEMIA  . Ganglion cyst excision  06/09/2012    Procedure: REMOVAL GANGLION OF WRIST;  Surgeon: Magnus Sinning, MD;  Location: [facility];  Service: Orthopedics;  Laterality: Left;  EXCISION OF GANGLION CYST LEFT RING FINGER  IV REGIONAL ANESTHESIA  . Abdominal hysterectomy    .  Laparoscopic cholecystectomy  05-13-2002    Gall Bladder  . Spine surgery      Current Outpatient Rx  Name  Route  Sig  Dispense  Refill  . acetaminophen (TYLENOL) 325 MG tablet   Oral   Take 650 mg by mouth every 6 (six) hours as needed for moderate pain or headache.         . levothyroxine (SYNTHROID, LEVOTHROID) 125 MCG tablet   Oral   Take 125 mcg by mouth every other day. To alternate with 121mcg dose         . levothyroxine (SYNTHROID, LEVOTHROID) 150 MCG tablet   Oral   Take 150 mcg by mouth every other day. To alternate with 158mcg dose           Allergies Latex; Codeine; Contrast media; and Adhesive  Family History  Problem Relation Age of Onset  . Deep vein thrombosis Mother     Right Leg  . Heart disease Mother      Before age 49-  CHF  . Varicose Veins Mother   . Heart attack Mother   . Heart disease Father     Before age 76-  PVD  . Heart attack Father   . Arthritis Father     Gout  . Hypertension Father   . Cancer Sister     Thyroid-Back  . Cancer Sister     Breast and Lung  . Heart disease Sister   . Heart disease Sister     Social History Social History  Substance Use Topics  . Smoking status: Former Smoker -- 40 years    Types: Cigarettes    Quit date: 06/04/1991  . Smokeless tobacco: Never Used  . Alcohol Use: No    Review of Systems Constitutional: No fever/chills Eyes: No visual changes. ENT: No sore throat. Cardiovascular: Denies chest pain. Respiratory: Increasingly short of breath with exertion. Gastrointestinal: No constipation. Genitourinary: Negative for dysuria. Musculoskeletal: Negative for back pain. Skin: Negative for rash. Neurological: Negative for headaches, focal weakness or numbness.  10-point ROS otherwise negative.  ____________________________________________   PHYSICAL EXAM:  VITAL SIGNS: ED Triage Vitals  Enc Vitals Group     BP 04/03/16 2012 116/94 mmHg     Pulse Rate 04/03/16 2012 142     Resp 04/03/16 2012 18     Temp 04/03/16 2012 98.3 F (36.8 C)     Temp Source 04/03/16 2012 Oral     SpO2 04/03/16 2012 99 %     Weight 04/03/16 2012 192 lb (87.091 kg)     Height 04/03/16 2012 5\' 5"  (1.651 m)     Head Cir --      Peak Flow --      Pain Score --      Pain Loc --      Pain Edu? --      Excl. in [place]? --     Constitutional: Alert and oriented. Pale and in no acute distress. Eyes: Conjunctivae are normal. PERRL. EOMI. Head: Atraumatic. Nose: No congestion/rhinnorhea. Mouth/Throat: Mucous membranes are moist.   Neck: No stridor.   Cardiovascular: Irregularly irregular. Grossly normal heart sounds.  Good peripheral circulation. Respiratory: Normal respiratory effort.  No retractions. Lungs CTAB. Gastrointestinal: Soft and mild  right upper and right lower tenderness palpation.  No CVA tenderness.  Small non-engorged hemorrhoid at 6:00. Rectal exam with small amount of brown stool which is strongly heme positive. Musculoskeletal: No lower extremity tenderness nor edema.  No joint effusions. Neurologic:  Normal speech and language. No gross focal neurologic deficits are appreciated.  Skin:  Skin is warm, dry and intact. No rash noted. Psychiatric: Mood and affect are normal. Speech and behavior are normal.  ____________________________________________   LABS (all labs ordered are listed, but only abnormal results are displayed)  Labs Reviewed  CBC WITH DIFFERENTIAL/PLATELET - Abnormal; Notable for the following:    RBC 2.00 (*)    Hemoglobin 8.6 (*)    HCT 24.7 (*)    MCV 123.3 (*)    MCH 42.8 (*)    RDW 20.4 (*)    All other components within normal limits  COMPREHENSIVE METABOLIC PANEL - Abnormal; Notable for the following:    Potassium 3.4 (*)    Glucose, Bld 106 (*)    Calcium 8.8 (*)    Total Bilirubin 1.8 (*)    All other components within normal limits  URINALYSIS COMPLETEWITH MICROSCOPIC ([facility] ONLY) - Abnormal; Notable for the following:    Color, Urine STRAW (*)    APPearance CLEAR (*)    Ketones, ur 1+ (*)    Bacteria, UA RARE (*)    Squamous Epithelial / LPF 0-5 (*)    All other components within normal limits  TSH - Abnormal; Notable for the following:    TSH 6.561 (*)    All other components within normal limits  C DIFFICILE QUICK SCREEN W PCR REFLEX  GASTROINTESTINAL PANEL BY PCR, STOOL (REPLACES STOOL CULTURE)  LIPASE, BLOOD  TROPONIN I  PROTIME-INR  APTT  MAGNESIUM   ____________________________________________  EKG  ED ECG REPORT I, Doran Stabler, the attending physician, personally viewed and interpreted this ECG.   Date: 04/03/2016  EKG Time: 2016  Rate: 161  Rhythm: atrial fibrillation, rate 161  Axis: Normal  Intervals:none  ST&T Change: No ST elevations.  Mild depression in V3 through V5 which is likely rate related.  ____________________________________________  RADIOLOGY  IMPRESSION: No active cardiopulmonary disease.   Electronically Signed By: Inez Catalina M.D. On: 04/03/2016 20:57 ____________________________________________   PROCEDURES   ____________________________________________   INITIAL IMPRESSION / ASSESSMENT AND PLAN / ED COURSE  Pertinent labs & imaging results that were available during my care of the patient were reviewed by me and considered in my medical decision making (see chart for details).  ----------------------------------------- 11:13 PM on 04/03/2016 -----------------------------------------  Patient with heart rate persistently in the 130s to 140s even after a bolus of Cardizem. Her pressure is now around 123XX123 systolic. She'll be admitted to the hospital. I will not start her on any anticoagulation for her atrial fibrillation with rapid ventricular response because of her low hemoglobin and heme positive stool. I also discussed this with Dr. Satira Mccallum who agrees. I discussed admission with the patient has a family for further workup and treatment. She is pending a CAT scan her abdomen. She was signed out to Dr. Jannifer Franklin.   ____________________________________________   FINAL CLINICAL IMPRESSION(S) / ED DIAGNOSES  Right-sided abdominal pain. Diarrhea. New-onset Atrial fibrillation with rapid ventricular response. GI bleed.    Orbie Pyo, MD 04/03/16 [phone]

## 2016-04-04 ENCOUNTER — Inpatient Hospital Stay: Payer: Medicare Other | Admitting: Anesthesiology

## 2016-04-04 ENCOUNTER — Encounter: Payer: Self-pay | Admitting: Emergency Medicine

## 2016-04-04 ENCOUNTER — Encounter
Admission: EM | Disposition: A | Discharge status: Home / Self Care | Payer: Self-pay | Source: Home / Self Care | Attending: Internal Medicine

## 2016-04-04 ENCOUNTER — Inpatient Hospital Stay: Admit: 2016-04-04 | Payer: Medicare Other

## 2016-04-04 DIAGNOSIS — D649 Anemia, unspecified: Secondary | ICD-10-CM

## 2016-04-04 DIAGNOSIS — R1115 Cyclical vomiting syndrome unrelated to migraine: Secondary | ICD-10-CM | POA: Insufficient documentation

## 2016-04-04 DIAGNOSIS — K921 Melena: Secondary | ICD-10-CM

## 2016-04-04 DIAGNOSIS — N912 Amenorrhea, unspecified: Secondary | ICD-10-CM

## 2016-04-04 DIAGNOSIS — I480 Paroxysmal atrial fibrillation: Principal | ICD-10-CM

## 2016-04-04 DIAGNOSIS — K922 Gastrointestinal hemorrhage, unspecified: Secondary | ICD-10-CM | POA: Insufficient documentation

## 2016-04-04 DIAGNOSIS — R197 Diarrhea, unspecified: Secondary | ICD-10-CM

## 2016-04-04 DIAGNOSIS — G43A1 Cyclical vomiting, intractable: Secondary | ICD-10-CM

## 2016-04-04 HISTORY — PX: ESOPHAGOGASTRODUODENOSCOPY (EGD) WITH PROPOFOL: SHX5813

## 2016-04-04 LAB — GASTROINTESTINAL PANEL BY PCR, STOOL (REPLACES STOOL CULTURE)
ADENOVIRUS F40/41: NOT DETECTED
Astrovirus: NOT DETECTED
CAMPYLOBACTER SPECIES: NOT DETECTED
CRYPTOSPORIDIUM: NOT DETECTED
CYCLOSPORA CAYETANENSIS: NOT DETECTED
E. coli O157: NOT DETECTED
ENTEROAGGREGATIVE E COLI (EAEC): NOT DETECTED
ENTEROPATHOGENIC E COLI (EPEC): NOT DETECTED
Entamoeba histolytica: NOT DETECTED
Enterotoxigenic E coli (ETEC): NOT DETECTED
GIARDIA LAMBLIA: NOT DETECTED
Norovirus GI/GII: NOT DETECTED
PLESIMONAS SHIGELLOIDES: NOT DETECTED
Rotavirus A: NOT DETECTED
SALMONELLA SPECIES: NOT DETECTED
SHIGELLA/ENTEROINVASIVE E COLI (EIEC): NOT DETECTED
Sapovirus (I, II, IV, and V): NOT DETECTED
Shiga like toxin producing E coli (STEC): NOT DETECTED
VIBRIO SPECIES: NOT DETECTED
Vibrio cholerae: NOT DETECTED
YERSINIA ENTEROCOLITICA: NOT DETECTED

## 2016-04-04 LAB — CBC
HCT: 20.8 % — ABNORMAL LOW (ref 35.0–47.0)
HEMOGLOBIN: 7.2 g/dL — AB (ref 12.0–16.0)
MCH: 42.1 pg — AB (ref 26.0–34.0)
MCHC: 34.5 g/dL (ref 32.0–36.0)
MCV: 122 fL — ABNORMAL HIGH (ref 80.0–100.0)
PLATELETS: 135 10*3/uL — AB (ref 150–440)
RBC: 1.71 MIL/uL — AB (ref 3.80–5.20)
RDW: 20 % — ABNORMAL HIGH (ref 11.5–14.5)
WBC: 3.4 10*3/uL — AB (ref 3.6–11.0)

## 2016-04-04 LAB — C DIFFICILE QUICK SCREEN W PCR REFLEX
C DIFFICLE (CDIFF) ANTIGEN: NEGATIVE
C Diff interpretation: NEGATIVE
C Diff toxin: NEGATIVE

## 2016-04-04 LAB — BASIC METABOLIC PANEL
ANION GAP: 4 — AB (ref 5–15)
BUN: 12 mg/dL (ref 6–20)
CHLORIDE: 113 mmol/L — AB (ref 101–111)
CO2: 26 mmol/L (ref 22–32)
CREATININE: 0.69 mg/dL (ref 0.44–1.00)
Calcium: 8 mg/dL — ABNORMAL LOW (ref 8.9–10.3)
GFR calc non Af Amer: >60 mL/min (ref >60)
Glucose, Bld: 100 mg/dL — ABNORMAL HIGH (ref 65–99)
POTASSIUM: 3.9 mmol/L (ref 3.5–5.1)
SODIUM: 143 mmol/L (ref 135–145)

## 2016-04-04 LAB — IRON AND TIBC
IRON: 133 ug/dL (ref 28–170)
SATURATION RATIOS: 64 % — AB (ref 10.4–31.8)
TIBC: 209 ug/dL — ABNORMAL LOW (ref 250–450)
UIBC: 76 ug/dL

## 2016-04-04 LAB — GLUCOSE, CAPILLARY: Glucose-Capillary: 96 mg/dL (ref 65–99)

## 2016-04-04 LAB — VITAMIN B12: Vitamin B-12: 50 pg/mL — ABNORMAL LOW (ref 180–914)

## 2016-04-04 SURGERY — ESOPHAGOGASTRODUODENOSCOPY (EGD) WITH PROPOFOL
Anesthesia: General

## 2016-04-04 MED ORDER — EPHEDRINE SULFATE 50 MG/ML IJ SOLN
INTRAMUSCULAR | Status: DC | PRN
  Administered 2016-04-04: 10 mg via INTRAVENOUS

## 2016-04-04 MED ORDER — ONDANSETRON HCL 4 MG/2ML IJ SOLN: 4.0000 mg | Freq: Once | INTRAMUSCULAR | Status: DC | PRN

## 2016-04-04 MED ORDER — LEVOTHYROXINE SODIUM 75 MCG PO TABS
150.0000 ug | ORAL_TABLET | ORAL | Status: DC
  Administered 2016-04-05: 150 ug via ORAL
  Filled 2016-04-04: qty 2

## 2016-04-04 MED ORDER — PROPOFOL 10 MG/ML IV BOLUS
INTRAVENOUS | Status: DC | PRN
  Administered 2016-04-04: 60 mg via INTRAVENOUS

## 2016-04-04 MED ORDER — DEXTROSE 5 % IV SOLN
5.0000 mg/h | INTRAVENOUS | Status: DC
  Administered 2016-04-04: 10 mg/h via INTRAVENOUS
  Filled 2016-04-04: qty 100

## 2016-04-04 MED ORDER — MIDAZOLAM HCL 5 MG/5ML IJ SOLN
INTRAMUSCULAR | Status: DC | PRN
  Administered 2016-04-04: 2 mg via INTRAVENOUS

## 2016-04-04 MED ORDER — SODIUM CHLORIDE 0.9 % IV SOLN
INTRAVENOUS | Status: DC
  Administered 2016-04-04 (×2): via INTRAVENOUS

## 2016-04-04 MED ORDER — ACETAMINOPHEN 325 MG PO TABS
650.0000 mg | ORAL_TABLET | Freq: Four times a day (QID) | ORAL | Status: DC | PRN
  Administered 2016-04-04 (×2): 650 mg via ORAL
  Filled 2016-04-04 (×2): qty 2

## 2016-04-04 MED ORDER — FENTANYL CITRATE (PF) 100 MCG/2ML IJ SOLN
INTRAMUSCULAR | Status: DC | PRN
  Administered 2016-04-04: 50 ug via INTRAVENOUS

## 2016-04-04 MED ORDER — FENTANYL CITRATE (PF) 100 MCG/2ML IJ SOLN: 25.0000 ug | INTRAMUSCULAR | Status: DC | PRN

## 2016-04-04 MED ORDER — ACETAMINOPHEN 650 MG RE SUPP: 650.0000 mg | Freq: Four times a day (QID) | RECTAL | Status: DC | PRN

## 2016-04-04 MED ORDER — FERROUS SULFATE 325 (65 FE) MG PO TABS
325.0000 mg | ORAL_TABLET | Freq: Two times a day (BID) | ORAL | Status: DC
  Administered 2016-04-04: 325 mg via ORAL
  Filled 2016-04-04: qty 1

## 2016-04-04 MED ORDER — ONDANSETRON HCL 4 MG PO TABS: 4.0000 mg | ORAL_TABLET | Freq: Four times a day (QID) | ORAL | Status: DC | PRN

## 2016-04-04 MED ORDER — DILTIAZEM HCL 30 MG PO TABS
30.0000 mg | ORAL_TABLET | Freq: Four times a day (QID) | ORAL | Status: DC
  Administered 2016-04-04 – 2016-04-05 (×5): 30 mg via ORAL
  Filled 2016-04-04 (×5): qty 1

## 2016-04-04 MED ORDER — LEVOTHYROXINE SODIUM 125 MCG PO TABS
125.0000 ug | ORAL_TABLET | ORAL | Status: DC
  Administered 2016-04-04: 125 ug via ORAL
  Filled 2016-04-04 (×2): qty 1

## 2016-04-04 MED ORDER — PROPOFOL 500 MG/50ML IV EMUL
INTRAVENOUS | Status: DC | PRN
  Administered 2016-04-04: 140 ug/kg/min via INTRAVENOUS

## 2016-04-04 MED ORDER — LIDOCAINE HCL (CARDIAC) 20 MG/ML IV SOLN
INTRAVENOUS | Status: DC | PRN
  Administered 2016-04-04: 40 mg via INTRAVENOUS

## 2016-04-04 MED ORDER — ONDANSETRON HCL 4 MG/2ML IJ SOLN: 4.0000 mg | Freq: Four times a day (QID) | INTRAMUSCULAR | Status: DC | PRN

## 2016-04-04 MED ORDER — SODIUM CHLORIDE 0.9 % IV SOLN
INTRAVENOUS | Status: AC
  Administered 2016-04-04: 03:00:00 via INTRAVENOUS

## 2016-04-04 MED ORDER — PANTOPRAZOLE SODIUM 40 MG PO TBEC
40.0000 mg | DELAYED_RELEASE_TABLET | Freq: Every day | ORAL | Status: DC
  Administered 2016-04-04 – 2016-04-05 (×2): 40 mg via ORAL
  Filled 2016-04-04 (×2): qty 1

## 2016-04-04 NOTE — Op Note (Signed)
[facility] Gastroenterology Patient Name: Sydney Ayala Procedure Date: 04/04/2016 1:52 PM MRN: [id number] Account #: [id number] Date of Birth: 1946-04-24 Admit Type: Inpatient Age: 70 Room: [facility] ENDO ROOM 4 Gender: Female Note Status: Finalized Procedure:            Upper GI endoscopy Indications:          Melena, Nausea with vomiting Providers:            Lucilla Lame, MD Referring MD:         Morene Crocker (Referring MD) Medicines:            Propofol per Anesthesia Complications:        No immediate complications. Procedure:            Pre-Anesthesia Assessment:                       - Prior to the procedure, a History and Physical was                        performed, and patient medications and allergies were                        reviewed. The patient's tolerance of previous                        anesthesia was also reviewed. The risks and benefits of                        the procedure and the sedation options and risks were                        discussed with the patient. All questions were                        answered, and informed consent was obtained. Prior                        Anticoagulants: The patient has taken no previous                        anticoagulant or antiplatelet agents. ASA Grade                        Assessment: II - A patient with mild systemic disease.                        After reviewing the risks and benefits, the patient was                        deemed in satisfactory condition to undergo the                        procedure.                       After obtaining informed consent, the endoscope was                        passed under direct vision. Throughout the procedure,  the patient's blood pressure, pulse, and oxygen                        saturations were monitored continuously. The Endoscope                        was introduced through the mouth, and advanced to the          second part of duodenum. The upper GI endoscopy was                        accomplished without difficulty. The patient tolerated                        the procedure well. Findings:      The esophagus was normal.      The stomach was normal.      The examined duodenum was normal. Impression:           - Normal esophagus.                       - Normal stomach.                       - Normal examined duodenum.                       - No specimens collected. Recommendation:       - Would look for anothr cause of the patients symptoms. Procedure Code(s):    --- Professional ---                       [phone], Esophagogastroduodenoscopy, flexible, transoral;                        diagnostic, including collection of specimen(s) by                        brushing or washing, when performed (separate procedure) Diagnosis Code(s):    --- Professional ---                       K92.1, Melena (includes Hematochezia)                       R11.2, Nausea with vomiting, unspecified CPT copyright 2016 American Medical Association. All rights reserved. The codes documented in this report are preliminary and upon coder review may  be revised to meet current compliance requirements. Lucilla Lame, MD 04/04/2016 2:07:26 PM This report has been signed electronically. Number of Addenda: 0 Note Initiated On: 04/04/2016 1:52 PM      [facility]

## 2016-04-04 NOTE — Progress Notes (Signed)
Patients daughter called to let this RN know that patient has been helping take care of her grandson the past few weeks who had HSV meningitis. Daughter asked RN to relay this information to the MD as she feels it may be pertinent to her mothers care. MD rounding and has been updated.

## 2016-04-04 NOTE — Progress Notes (Signed)
Cardizem gtt stopped per Dr Posey Pronto; patient in sinus rhythm, rate 60s, 2nd dose of po cardizem given.

## 2016-04-04 NOTE — Anesthesia Preprocedure Evaluation (Addendum)
Anesthesia Evaluation  Patient identified by MRN, date of birth, ID band Patient awake    Reviewed: Allergy & Precautions, H&P , NPO status , Patient's Chart, lab work & pertinent test results  History of Anesthesia Complications (+) PONV and history of anesthetic complications  Airway Mallampati: II  TM Distance: >3 FB Neck ROM: Full    Dental  (+) Teeth Intact, Chipped, Dental Advisory Given   Pulmonary shortness of breath and with exertion, former smoker,  Occasional cough   Pulmonary exam normal breath sounds clear to auscultation       Cardiovascular + Peripheral Vascular Disease  negative cardio ROS Normal cardiovascular exam+ dysrhythmias Atrial Fibrillation + Valvular Problems/Murmurs  Rhythm:Regular Rate:Normal     Neuro/Psych Cervical radiculopathy  Neuromuscular disease negative neurological ROS  negative psych ROS   GI/Hepatic Neg liver ROS, hiatal hernia, GERD  Medicated and Controlled,  Endo/Other  negative endocrine ROSHypothyroidism   Renal/GU negative Renal ROS  negative genitourinary   Musculoskeletal negative musculoskeletal ROS (+) Arthritis , Osteoarthritis,    Abdominal Normal abdominal exam  (+)   Peds negative pediatric ROS (+)  Hematology negative hematology ROS (+) anemia ,   Anesthesia Other Findings   Reproductive/Obstetrics negative OB ROS                            Anesthesia Physical  Anesthesia Plan  ASA: III  Anesthesia Plan: General   Post-op Pain Management:    Induction: Intravenous  Airway Management Planned: Nasal Cannula  Additional Equipment:   Intra-op Plan:   Post-operative Plan: Extubation in OR  Informed Consent: I have reviewed the patients History and Physical, chart, labs and discussed the procedure including the risks, benefits and alternatives for the proposed anesthesia with the patient or authorized representative who has  indicated his/her understanding and acceptance.   Dental advisory given  Plan Discussed with: CRNA and Surgeon  Anesthesia Plan Comments:         Anesthesia Quick Evaluation

## 2016-04-04 NOTE — Progress Notes (Signed)
Per Dr. Posey Pronto patient can resume regular diet after EGD. Confirmed that f/u CBC scheduled for tomorrow AM.

## 2016-04-04 NOTE — Progress Notes (Signed)
Initial Nutrition Assessment     INTERVENTION:  Monitor diet progression when medically able   NUTRITION DIAGNOSIS:   Inadequate oral intake related to altered GI function as evidenced by NPO status.    GOAL:   Patient will meet greater than or equal to 90% of their needs    MONITOR:   Diet advancement  REASON FOR ASSESSMENT:   Malnutrition Screening Tool    ASSESSMENT:   70 y/o female admitted with dairrhea for months, anemia, afib RVR and vomitingPlanning EGD this pm with Dr. Rhylen Pulido Norris Past Medical History  Diagnosis Date  . Hypothyroidism   . Ganglion cyst LEFT RING FINGER  . Chronic low back pain LUMBAR    RELEIVE W/ TENS UNIT PRN AND CHRIOPACTOR  . Left hand weakness   . Numbness and tingling BILATERAL ARMS SECONDARY TO CERVICAL PINCHED NERVE  . Pinched nerve in neck   . Heart murmur ASYMPTOMATIC  . History of rheumatic fever CHILDHOOD  . Short of breath on exertion   . Arthritis BACK, SHOULDER  . H/O tenderness in limb BILATEARL LEGS AND HEELS DUE TO TENDINITIS AND SPURS  . Mild acid reflux   . H/O hiatal hernia   . Urge urinary incontinence   . Snores   . Varicose veins     Left Leg    Pt reports poor po intake for the past 2 months eating bites to 50% at best of meals.  Having diarrhea for the last few months Medications reviewed: Labs reviewed, c-diff negative  Nutrition Focused physical exam not able to complete as getting ready for echo to be done.  Diet Order:  Diet NPO time specified  Skin:  Reviewed, no issues  Last BM:  4/21  Height:   Ht Readings from Last 1 Encounters:  04/03/16 5\' 5"  (1.651 m)    Weight: 4% wt loss in the last 2 months  Wt Readings from Last 1 Encounters:  04/04/16 186 lb 8 oz (84.596 kg)    Ideal Body Weight:     BMI:  Body mass index is 31.04 kg/(m^2).  Estimated Nutritional Needs:   Kcal:  2100 kcals/d  Protein:  84-101 g/d  Fluid:  2 L/d  EDUCATION NEEDS:   No education needs identified at  this time  Sydney Ayala, [name], [name] (pager) Weekend/On-Call pager [phone])

## 2016-04-04 NOTE — Consult Note (Signed)
[facility]  [address], [place], [place] [address] Phone: [phone] Fax : [phone]  Consultation  Referring Provider:     No ref. provider found Primary Care Physician:  Lillard Anes, MD Primary Gastroenterologist:  None         Reason for Consultation:     Diarrhea with nausea vomiting  Date of Admission:  04/03/2016 Date of Consultation:  04/04/2016         HPI:   Sydney Ayala is a 70 y.o. female who was admitted with several weeks to months of nausea with vomiting and persistent diarrhea. The patient was found to have a hemoglobin of 8.6 and was found to have heme positive stools. The patient reports that she has been anemic in the past. The patient has a history of hypothyroidism. She was admitted because she became acutely weak right to admission and was reporting up to 10 episodes of diarrhea per day. Her vomiting was bilious on her way to the hospital. She reports that she started having these problems when she had a stomach flu about a month ago and the diarrhea has not gone away. The patient also reports that she's had a weight loss of approximately 8 pounds over the last 2 months and may have even lost more recently without eating much.  Past Medical History  Diagnosis Date  . Hypothyroidism   . Ganglion cyst LEFT RING FINGER  . Chronic low back pain LUMBAR    RELEIVE W/ TENS UNIT PRN AND CHRIOPACTOR  . Left hand weakness   . Numbness and tingling BILATERAL ARMS SECONDARY TO CERVICAL PINCHED NERVE  . Pinched nerve in neck   . Heart murmur ASYMPTOMATIC  . History of rheumatic fever CHILDHOOD  . Short of breath on exertion   . Arthritis BACK, SHOULDER  . H/O tenderness in limb BILATEARL LEGS AND HEELS DUE TO TENDINITIS AND SPURS  . Mild acid reflux   . H/O hiatal hernia   . Urge urinary incontinence   . Snores   . Varicose veins     Left Leg    Past Surgical History  Procedure Laterality Date  . Left breast ductal excision   10-11-2009    BENIGN  . Rotator cuff repair  04-11-2008    LEFT SHOULDER  . Vaginal hysterectomy/ anterior & posterior repair/ transobturator sling  04-25-2005    CYSTOCELE/ RECTOCELE/ UTERINE PROLAPSE/ SUI  . Right breast lumpectomy      BENIGN  . Thyroid goiter removed  1970'S  . Dilation and curettage of uterus    . Hemrroidectomy and anal fissure repair  1965  . Left hand surg.  1980'S    EXTENSIVE REPAIR AND REMOVAL OF LIGAMENT/TENDON  (?CANCER)  . Removal right goin mass  1980'S    BENIGN  . Right foot surg  X3    REPAIR OF 2 TOES  . Lumbar laminectomy  1990  &  1976    RIGHT SIDE L4 - 5  & L5 - S1  . Tonsillectomy  CHILD  . Removal toe nailbed  AGE 89    ALL 10 TOES-- DEFORMED  . Cardiovascular stress test  APRIL 2009    NORMAL LVSF/ EF 61%/ NO ISCHEMIA  . Ganglion cyst excision  06/09/2012    Procedure: REMOVAL GANGLION OF WRIST;  Surgeon: Magnus Sinning, MD;  Location: [facility];  Service: Orthopedics;  Laterality: Left;  EXCISION OF GANGLION CYST LEFT RING FINGER  IV REGIONAL ANESTHESIA  .  Abdominal hysterectomy    . Laparoscopic cholecystectomy  05-13-2002    Gall Bladder  . Spine surgery      Prior to Admission medications   Medication Sig Start Date End Date Taking? Authorizing Provider  acetaminophen (TYLENOL) 325 MG tablet Take 650 mg by mouth every 6 (six) hours as needed for moderate pain or headache.   Yes Historical Provider, MD  levothyroxine (SYNTHROID, LEVOTHROID) 125 MCG tablet Take 125 mcg by mouth every other day. To alternate with 159mcg dose   Yes Historical Provider, MD  levothyroxine (SYNTHROID, LEVOTHROID) 150 MCG tablet Take 150 mcg by mouth every other day. To alternate with 134mcg dose   Yes Historical Provider, MD    Family History  Problem Relation Age of Onset  . Deep vein thrombosis Mother     Right Leg  . Heart disease Mother     Before age 69-  CHF  . Varicose Veins Mother   . Heart attack Mother   . Heart  disease Father     Before age 48-  PVD  . Heart attack Father   . Arthritis Father     Gout  . Hypertension Father   . Cancer Sister     Thyroid-Back  . Cancer Sister     Breast and Lung  . Heart disease Sister   . Heart disease Sister      Social History  Substance Use Topics  . Smoking status: Former Smoker -- 40 years    Types: Cigarettes    Quit date: 06/04/1991  . Smokeless tobacco: Never Used  . Alcohol Use: No    Allergies as of 04/03/2016 - Review Complete 04/03/2016  Allergen Reaction Noted  . Latex Rash 04/30/2015  . Codeine Nausea And Vomiting 06/03/2012  . Contrast media [iodinated diagnostic agents] Nausea And Vomiting and Other (See Comments) 06/03/2012  . Adhesive [tape] Rash and Other (See Comments) 06/02/2012    Review of Systems:    All systems reviewed and negative except where noted in HPI.   Physical Exam:  Vital signs in last 24 hours: Temp:  [97.2 F (36.2 C)-98.3 F (36.8 C)] 97.2 F (36.2 C) (04/21 1329) Pulse Rate:  [49-142] 96 (04/21 1329) Resp:  [9-26] 20 (04/21 1329) BP: (100-130)/(41-94) 125/58 mmHg (04/21 1329) SpO2:  [91 %-99 %] 95 % (04/21 1329) Weight:  [186 lb 8 oz (84.596 kg)-192 lb (87.091 kg)] 192 lb (87.091 kg) (04/21 1329) Last BM Date: 04/04/16 General:   Pleasant, cooperative in NAD Head:  Normocephalic and atraumatic. Eyes:   No icterus.   Conjunctiva pink. PERRLA. Ears:  Normal auditory acuity. Neck:  Supple; no masses or thyroidomegaly Lungs: Respirations even and unlabored. Lungs clear to auscultation bilaterally.   No wheezes, crackles, or rhonchi.  Heart:  Regular rate and rhythm;  Without murmur, clicks, rubs or gallops Abdomen:  Soft, nondistended, nontender. Normal bowel sounds. No appreciable masses or hepatomegaly.  No rebound or guarding.  Rectal:  Not performed. Msk:  Symmetrical without gross deformities.  Strength  Extremities:  Without edema, cyanosis or clubbing. Neurologic:  Alert and oriented x3;   grossly normal neurologically. Skin:  Intact without significant lesions or rashes. Cervical Nodes:  No significant cervical adenopathy. Psych:  Alert and cooperative. Normal affect.  LAB RESULTS:  Recent Labs  04/03/16 2028 04/04/16 0454  WBC 3.7 3.4*  HGB 8.6* 7.2*  HCT 24.7* 20.8*  PLT 169 135*   BMET  Recent Labs  04/03/16 2028 04/04/16 0454  NA 140  143  K 3.4* 3.9  CL 109 113*  CO2 25 26  GLUCOSE 106* 100*  BUN 18 12  CREATININE 0.73 0.69  CALCIUM 8.8* 8.0*   LFT  Recent Labs  04/03/16 2028  PROT 6.8  ALBUMIN 4.3  AST 24  ALT 15  ALKPHOS 51  BILITOT 1.8*   PT/INR  Recent Labs  04/03/16 2137  LABPROT 15.0  INR 1.16    STUDIES: Ct Abdomen Pelvis Wo Contrast  04/03/2016  CLINICAL DATA:  Right-sided abdominal pain with vomiting and diarrhea for 2 months. Oral contrast only due to IV contrast allergy. EXAM: CT ABDOMEN AND PELVIS WITHOUT CONTRAST TECHNIQUE: Multidetector CT imaging of the abdomen and pelvis was performed following the standard protocol without IV contrast. COMPARISON:  None. FINDINGS: Mild dependent atelectasis in the lung bases. There is a 7 mm noncalcified nodule with the left lung base anteriorly. Non-contrast chest CT at 6-12 months is recommended. If the nodule is stable at time of repeat CT, then future CT at 18-24 months (from today's scan) is considered optional for low-risk patients, but is recommended for high-risk patients. This recommendation follows the consensus statement: Guidelines for Management of Incidental Pulmonary Nodules Detected on CT Images:From the Fleischner Society 2017; published online before print (10.1148/radiol.SG:[id number]). Residual contrast material in the lower esophagus may represent reflux or dysmotility. Surgical absence of the gallbladder. No bile duct dilatation. The unenhanced appearance of the liver, spleen, pancreas, adrenal glands, kidneys, abdominal aorta, inferior vena cava, and retroperitoneal lymph  nodes is unremarkable. Scattered mesenteric axis and celiac axis lymph nodes are not pathologically enlarged. Stomach, small bowel, and colon are not abnormally distended. Contrast material flows through to the colon without evidence of bowel obstruction. No free air or free fluid in the abdomen. Pelvis: Appendix is normal. Bladder wall is mildly thickened. There is a small amount of gas in the bladder. In the absence of instrumentation, this is likely due to cystitis. Uterus is surgically absent. No abnormal adnexal masses. No pelvic mass or lymphadenopathy. No free or loculated pelvic fluid collections. Degenerative changes in the spine. No destructive bone lesions. IMPRESSION: 7 mm noncalcified nodule left lung base anteriorly. See above followup recommendations. Residual contrast material in the lower esophagus may represent reflux or dysmotility. Bladder wall thickening and small amount of gas in the bladder suggest cystitis. Electronically Signed   By: Lucienne Capers M.D.   On: 04/03/2016 23:40   Dg Chest 2 View  04/03/2016  CLINICAL DATA:  Nausea and vomiting for 2 months EXAM: CHEST  2 VIEW COMPARISON:  None. FINDINGS: The heart size and mediastinal contours are within normal limits. Both lungs are clear. The visualized skeletal structures are unremarkable. IMPRESSION: No active cardiopulmonary disease. Electronically Signed   By: Inez Catalina M.D.   On: 04/03/2016 20:57      Impression / Plan:   Sydney Ayala is a 70 y.o. y/o female with With a history of nausea and vomiting with chronic diarrhea and a 8 pound weight loss. The patient states this all started when she started to have a stomach flu approximately 2 months ago. The patient will be set up for an EGD for today. She will likely need a colonoscopy in the future and this may be done as an outpatient. The EGD will be done due to her heme positive stools with her nausea and vomiting.I have discussed risks & benefits which include, but are  not limited to, bleeding, infection, perforation & drug reaction.  The  patient agrees with this plan & written consent will be obtained.      Thank you for involving me in the care of this patient.      LOS: 1 day   Ollen Bowl, MD  04/04/2016, 2:11 PM   Note: This dictation was prepared with Dragon dictation along with smaller phrase technology. Any transcriptional errors that result from this process are unintentional.

## 2016-04-04 NOTE — Care Management Note (Signed)
Case Management Note  Patient Details  Name: Sydney Ayala MRN: [id number] Date of Birth: 28-Jun-1946  Subjective/Objective: CM consult for discharge planning. Met with patient at bedside. She lives at home alone. Drives, independent. Patient on disability due to her back. She has medicare and medicaid.  Denies issues obtaining medications, copays or medical care. PCP is  Dr. Henrene Pastor. Patient state she is current with PCP.  Cardiology and GI consult pending.                Action/Plan:   Expected Discharge Date:                  Expected Discharge Plan:  Home/Self Care  In-House Referral:     Discharge planning Services  Homebound not met per provider, CM Consult  Post Acute Care Choice:    Choice offered to:     DME Arranged:    DME Agency:     HH Arranged:    HH Agency:     Status of Service:  In process, will continue to follow  Medicare Important Message Given:    Date Medicare IM Given:    Medicare IM give by:    Date Additional Medicare IM Given:    Additional Medicare Important Message give by:     If discussed at [place] of Stay Meetings, dates discussed:    Additional Comments:  Jolly Mango, RN 04/04/2016, 11:05 AM

## 2016-04-04 NOTE — Progress Notes (Signed)
Patient converted to NSR this morning, HR 60s-70s. PO cardizem given, gtt infusing and will be d/c'd after an hour or so. MD rounding aware.

## 2016-04-04 NOTE — Transfer of Care (Signed)
Immediate Anesthesia Transfer of Care Note  Patient: Sydney Ayala  Procedure(s) Performed: Procedure(s): ESOPHAGOGASTRODUODENOSCOPY (EGD) WITH PROPOFOL (N/A)  Patient Location: PACU  Anesthesia Type:General  Level of Consciousness: sedated  Airway & Oxygen Therapy: Patient Spontanous Breathing and Patient connected to nasal cannula oxygen  Post-op Assessment: Report given to RN and Post -op Vital signs reviewed and stable  Post vital signs: Reviewed and stable  Last Vitals:  Filed Vitals:   04/04/16 1139 04/04/16 1329  BP: 104/41 125/58  Pulse: 62 96  Temp: 36.6 C 36.2 C  Resp: 18 20    Complications: No apparent anesthesia complications

## 2016-04-04 NOTE — Anesthesia Postprocedure Evaluation (Signed)
Anesthesia Post Note  Patient: Sydney Ayala  Procedure(s) Performed: Procedure(s) (LRB): ESOPHAGOGASTRODUODENOSCOPY (EGD) WITH PROPOFOL (N/A)  Patient location during evaluation: PACU Anesthesia Type: General Level of consciousness: awake and alert and oriented Pain management: pain level controlled Vital Signs Assessment: post-procedure vital signs reviewed and stable Respiratory status: spontaneous breathing Cardiovascular status: blood pressure returned to baseline Anesthetic complications: no    Last Vitals:  Filed Vitals:   04/04/16 1450 04/04/16 1500  BP: 119/56 108/52  Pulse: 77 73  Temp:    Resp: 12 14    Last Pain:  Filed Vitals:   04/04/16 1500  PainSc: 0-No pain                 Dawn Kiper

## 2016-04-04 NOTE — Consult Note (Signed)
[facility] CONSULT NOTE  Patient ID: Sydney Ayala MRN: [id number] DOB/AGE: [birth year] 70 y.o.  Admit date: 04/03/2016 Referring Physician Dr. Posey Pronto Primary Physician Dr. Reinaldo Meeker Primary Cardiologist   Reason for Consultation afib  HPI: Pt with no prior cardiac history per her report who was admitted after presenting to the er with complaints of weakness and nausea and vomiting with frequent diarhea. She was noted to have a hgb of 8.6 and was in afib with rvr. She denis noting afib before. She was placed on a cardiazem drip but not anticoagulated due to anemia. She is being evaluated by gi. She has converted to nsr and is on cardizem 20 q 6 po. She has ruled otu for an mi. SHe has weight losss of 8 pounds over the past few months She denies chest pain. She has ruled out for an mi. EKG currently shows nsr with no ischemia  Review of Systems  HENT: Negative.   Eyes: Negative.   Respiratory: Negative.   Cardiovascular: Positive for palpitations.  Gastrointestinal: Positive for nausea, vomiting, diarrhea and blood in stool.  Genitourinary: Negative.   Musculoskeletal: Negative.   Skin: Negative.   Neurological: Positive for weakness.  Endo/Heme/Allergies: Negative.   Psychiatric/Behavioral: Negative.     Past Medical History  Diagnosis Date  . Hypothyroidism   . Ganglion cyst LEFT RING FINGER  . Chronic low back pain LUMBAR    RELEIVE W/ TENS UNIT PRN AND CHRIOPACTOR  . Left hand weakness   . Numbness and tingling BILATERAL ARMS SECONDARY TO CERVICAL PINCHED NERVE  . Pinched nerve in neck   . Heart murmur ASYMPTOMATIC  . History of rheumatic fever CHILDHOOD  . Short of breath on exertion   . Arthritis BACK, SHOULDER  . H/O tenderness in limb BILATEARL LEGS AND HEELS DUE TO TENDINITIS AND SPURS  . Mild acid reflux   . H/O hiatal hernia   . Urge urinary incontinence   . Snores   . Varicose veins     Left Leg     Family History  Problem Relation Age of Onset  . Deep vein thrombosis Mother     Right Leg  . Heart disease Mother     Before age 29-  CHF  . Varicose Veins Mother   . Heart attack Mother   . Heart disease Father     Before age 74-  PVD  . Heart attack Father   . Arthritis Father     Gout  . Hypertension Father   . Cancer Sister     Thyroid-Back  . Cancer Sister     Breast and Lung  . Heart disease Sister   . Heart disease Sister     Social History   Social History  . Marital Status: Divorced    Spouse Name: N/A  . Number of Children: N/A  . Years of Education: N/A   Occupational History  . Not on file.   Social History Main Topics  . Smoking status: Former Smoker -- 40 years    Types: Cigarettes    Quit date: 06/04/1991  . Smokeless tobacco: Never Used  . Alcohol Use: No  . Drug Use: No  . Sexual Activity: Not on file   Other Topics Concern  . Not on file   Social History Narrative    Past Surgical History  Procedure Laterality Date  . Left breast ductal excision  10-11-2009    BENIGN  . Rotator  cuff repair  04-11-2008    LEFT SHOULDER  . Vaginal hysterectomy/ anterior & posterior repair/ transobturator sling  04-25-2005    CYSTOCELE/ RECTOCELE/ UTERINE PROLAPSE/ SUI  . Right breast lumpectomy      BENIGN  . Thyroid goiter removed  1970'S  . Dilation and curettage of uterus    . Hemrroidectomy and anal fissure repair  1965  . Left hand surg.  1980'S    EXTENSIVE REPAIR AND REMOVAL OF LIGAMENT/TENDON  (?CANCER)  . Removal right goin mass  1980'S    BENIGN  . Right foot surg  X3    REPAIR OF 2 TOES  . Lumbar laminectomy  1990  &  1976    RIGHT SIDE L4 - 5  & L5 - S1  . Tonsillectomy  CHILD  . Removal toe nailbed  AGE 66    ALL 10 TOES-- DEFORMED  . Cardiovascular stress test  APRIL 2009    NORMAL LVSF/ EF 61%/ NO ISCHEMIA  . Ganglion cyst excision  06/09/2012    Procedure: REMOVAL GANGLION OF WRIST;  Surgeon: Magnus Sinning, MD;  Location:  [facility];  Service: Orthopedics;  Laterality: Left;  EXCISION OF GANGLION CYST LEFT RING FINGER  IV REGIONAL ANESTHESIA  . Abdominal hysterectomy    . Laparoscopic cholecystectomy  05-13-2002    Gall Bladder  . Spine surgery       Prescriptions prior to admission  Medication Sig Dispense Refill Last Dose  . acetaminophen (TYLENOL) 325 MG tablet Take 650 mg by mouth every 6 (six) hours as needed for moderate pain or headache.   prn at prn  . levothyroxine (SYNTHROID, LEVOTHROID) 125 MCG tablet Take 125 mcg by mouth every other day. To alternate with 174mcg dose   unknown at unknown  . levothyroxine (SYNTHROID, LEVOTHROID) 150 MCG tablet Take 150 mcg by mouth every other day. To alternate with 163mcg dose   unknown at unknown    Physical Exam: Blood pressure 112/55, pulse 78, temperature 97.9 F (36.6 C), temperature source Tympanic, resp. rate 18, height 5\' 5"  (1.651 m), weight 87.091 kg (192 lb), SpO2 92 %.   Wt Readings from Last 1 Encounters:  04/04/16 87.091 kg (192 lb)     General appearance: alert and cooperative Head: Normocephalic, without obvious abnormality, atraumatic Resp: clear to auscultation bilaterally Cardio: regular rate and rhythm GI: abnormal findings:  mild tenderness in the periumbilical area Neurologic: Grossly normal  Labs:   Lab Results  Component Value Date   WBC 3.4* 04/04/2016   HGB 7.2* 04/04/2016   HCT 20.8* 04/04/2016   MCV 122.0* 04/04/2016   PLT 135* 04/04/2016    Recent Labs Lab 04/03/16 2028 04/04/16 0454  NA 140 143  K 3.4* 3.9  CL 109 113*  CO2 25 26  BUN 18 12  CREATININE 0.73 0.69  CALCIUM 8.8* 8.0*  PROT 6.8  --   BILITOT 1.8*  --   ALKPHOS 51  --   ALT 15  --   AST 24  --   GLUCOSE 106* 100*   Lab Results  Component Value Date   TROPONINI <0.03 04/03/2016      Radiology: no acute cardiopulmonary disease EKG: Initially afib with rvr now with nsr.  ASSESSMENT AND PLAN:  Pt with history of n/v/d  and noted to be anemic and in afib. Has ruled out for mi. COnverted to nsr. Echo pending. Will continue with cardizem 30 q 6 and convert to cardizem 180 cd daily  prior to d/c.  Signed: Teodoro Spray MD, [facility] 04/04/2016, 4:46 PM

## 2016-04-04 NOTE — Anesthesia Procedure Notes (Signed)
Date/Time: 04/04/2016 2:00 PM Performed by: Allean Found Pre-anesthesia Checklist: Patient identified, Emergency Drugs available, Suction available, Patient being monitored and Timeout performed Patient Re-evaluated:Patient Re-evaluated prior to inductionOxygen Delivery Method: Nasal cannula Intubation Type: IV induction

## 2016-04-04 NOTE — Progress Notes (Signed)
Update given via phone to patients daughter-in-law, Velna Hatchet. Family member asked that they be called when/if patient has another procedure. They would like to know beforehand when/what is scheduled. Will pass on in report.

## 2016-04-04 NOTE — Progress Notes (Addendum)
Patient ID: Sydney Ayala, female   DOB: Jan 25, 1946, 70 y.o.   MRN: [id number] [place] [facility] [place] NAME: Sydney Ayala    MR#:  [id number]  DATE OF BIRTH:  [birth year]  SUBJECTIVE:  Patient came in with increasing weakness and nausea vomiting diarrhea for last several weeks. She was found to be in A. fib with RVR. Received IV Cardizem.. Patient denies any bloody stools. She denies any vomiting. She feels a little better after IV hydration.  REVIEW OF SYSTEMS:   Review of Systems  Constitutional: Negative for fever, chills and weight loss.  HENT: Negative for ear discharge, ear pain and nosebleeds.   Eyes: Negative for blurred vision, pain and discharge.  Respiratory: Negative for sputum production, shortness of breath, wheezing and stridor.   Cardiovascular: Positive for palpitations. Negative for chest pain, orthopnea and PND.  Gastrointestinal: Positive for nausea, vomiting, diarrhea and blood in stool.  Genitourinary: Negative for urgency and frequency.  Musculoskeletal: Negative for back pain and joint pain.  Neurological: Positive for weakness. Negative for sensory change, speech change and focal weakness.  Psychiatric/Behavioral: Negative for depression and hallucinations. The patient is not nervous/anxious.   All other systems reviewed and are negative.  Tolerating Diet:yes  DRUG ALLERGIES:   Allergies  Allergen Reactions  . Latex Rash  . Codeine Nausea And Vomiting  . Contrast Media [Iodinated Diagnostic Agents] Nausea And Vomiting and Other (See Comments)    "CONVULSIONS"  . Adhesive [Tape] Rash and Other (See Comments)    BURNS SKIN    VITALS:  Blood pressure 125/58, pulse 96, temperature 97.2 F (36.2 C), temperature source Tympanic, resp. rate 20, height 5\' 5"  (1.651 m), weight 87.091 kg (192 lb), SpO2 95 %.  PHYSICAL EXAMINATION:   Physical Exam  GENERAL:  70 y.o.-year-old patient lying in the bed with no  acute distress. Pallor+ EYES: Pupils equal, round, reactive to light and accommodation. No scleral icterus. Extraocular muscles intact.  HEENT: Head atraumatic, normocephalic. Oropharynx and nasopharynx clear.  NECK:  Supple, no jugular venous distention. No thyroid enlargement, no tenderness.  LUNGS: Normal breath sounds bilaterally, no wheezing, rales, rhonchi. No use of accessory muscles of respiration.  CARDIOVASCULAR: S1, S2 normal. No murmurs, rubs, or gallops. Irregularly irregular ABDOMEN: Soft, nontender, nondistended. Bowel sounds present. No organomegaly or mass.  EXTREMITIES: No cyanosis, clubbing or edema b/l.    NEUROLOGIC: Cranial nerves II through XII are intact. No focal Motor or sensory deficits b/l.   PSYCHIATRIC:  patient is alert and oriented x 3.  SKIN: No obvious rash, lesion, or ulcer.   LABORATORY PANEL:  CBC  Recent Labs Lab 04/04/16 0454  WBC 3.4*  HGB 7.2*  HCT 20.8*  PLT 135*    Chemistries   Recent Labs Lab 04/03/16 2028 04/04/16 0454  NA 140 143  K 3.4* 3.9  CL 109 113*  CO2 25 26  GLUCOSE 106* 100*  BUN 18 12  CREATININE 0.73 0.69  CALCIUM 8.8* 8.0*  MG 1.9  --   AST 24  --   ALT 15  --   ALKPHOS 51  --   BILITOT 1.8*  --    Cardiac Enzymes  Recent Labs Lab 04/03/16 2028  TROPONINI <0.03   RADIOLOGY:  Ct Abdomen Pelvis Wo Contrast  04/03/2016  CLINICAL DATA:  Right-sided abdominal pain with vomiting and diarrhea for 2 months. Oral contrast only due to IV contrast allergy. EXAM: CT ABDOMEN AND PELVIS WITHOUT CONTRAST  TECHNIQUE: Multidetector CT imaging of the abdomen and pelvis was performed following the standard protocol without IV contrast. COMPARISON:  None. FINDINGS: Mild dependent atelectasis in the lung bases. There is a 7 mm noncalcified nodule with the left lung base anteriorly. Non-contrast chest CT at 6-12 months is recommended. If the nodule is stable at time of repeat CT, then future CT at 18-24 months (from today's  scan) is considered optional for low-risk patients, but is recommended for high-risk patients. This recommendation follows the consensus statement: Guidelines for Management of Incidental Pulmonary Nodules Detected on CT Images:From the Fleischner Society 2017; published online before print (10.1148/radiol.SG:5268862). Residual contrast material in the lower esophagus may represent reflux or dysmotility. Surgical absence of the gallbladder. No bile duct dilatation. The unenhanced appearance of the liver, spleen, pancreas, adrenal glands, kidneys, abdominal aorta, inferior vena cava, and retroperitoneal lymph nodes is unremarkable. Scattered mesenteric axis and celiac axis lymph nodes are not pathologically enlarged. Stomach, small bowel, and colon are not abnormally distended. Contrast material flows through to the colon without evidence of bowel obstruction. No free air or free fluid in the abdomen. Pelvis: Appendix is normal. Bladder wall is mildly thickened. There is a small amount of gas in the bladder. In the absence of instrumentation, this is likely due to cystitis. Uterus is surgically absent. No abnormal adnexal masses. No pelvic mass or lymphadenopathy. No free or loculated pelvic fluid collections. Degenerative changes in the spine. No destructive bone lesions. IMPRESSION: 7 mm noncalcified nodule left lung base anteriorly. See above followup recommendations. Residual contrast material in the lower esophagus may represent reflux or dysmotility. Bladder wall thickening and small amount of gas in the bladder suggest cystitis. Electronically Signed   By: Lucienne Capers M.D.   On: 04/03/2016 23:40   Dg Chest 2 View  04/03/2016  CLINICAL DATA:  Nausea and vomiting for 2 months EXAM: CHEST  2 VIEW COMPARISON:  None. FINDINGS: The heart size and mediastinal contours are within normal limits. Both lungs are clear. The visualized skeletal structures are unremarkable. IMPRESSION: No active cardiopulmonary  disease. Electronically Signed   By: Inez Catalina M.D.   On: 04/03/2016 20:57   ASSESSMENT AND PLAN:  Sydney Ayala is a 70 y.o. female who presents with Persistent diarrhea for the last several weeks to months, as well as development of nausea and vomiting today. She's had increasing weakness over the past several weeks as well. She had a significant episode of lightheadedness today which prompted her to come to the ED for evaluation. Here she was found to be significantly anemic with a hemoglobin of 8.6, last known value in our system was from a couple years ago but was well within normal range and 14  1. Paroxysmal atrial fibrillation with RVR ([place]) - patient responded some to initial Cardizem IV bolus but her heart rate was still not at goal, and due to her blood pressure she was initiated on a slower infusion of Cardizem.  -Patient has been off IV Cardizem drip -Changed to by mouth Cardizem 30 mg 4 times a day. Patient has converted to sinus rhythm. -Cardiology consultation -Echo  2  Nausea, vomiting and diarrhea - unclear etiology. Patient states she initially had a "stomach flu" a couple months ago. After that time she has had episodes of diarrhea. This is gotten worse over the last 2-3 weeks. Today she had some nausea and vomiting. C. difficile and GI panel negative P  3. Anemia - unclear etiology, she is guaiac positive  but with no report of any overt bleed or melena. She certainly seems to report some symptoms from her anemia. However, do not feel like she needs a blood transfusion at this time.  -Patient underwent EGD which is essentially negative. Further GI recommendations per Dr. Lottie Dawson HL  4. Hypothyroidism - continue home dose thyroid replacement   5. GERD (gastroesophageal reflux disease) - initiate PPI  Case discussed with Care Management/Social Worker. Management plans discussed with the patient, family and they are in agreement.  CODE STATUS: Full  DVT Prophylaxis: Teds  SCD  TOTAL TIME TAKING CARE OF THIS PATIENT: 35 minutes.  >50% time spent on counselling and coordination of care  POSSIBLE D/C IN one to 2 DAYS, DEPENDING ON CLINICAL CONDITION.  Note: This dictation was prepared with Dragon dictation along with smaller phrase technology. Any transcriptional errors that result from this process are unintentional.  Chadric Kimberley M.D on 04/04/2016 at 2:33 PM  Between 7am to 6pm - Pager - [phone]  After 6pm go to [email or web address] - password [email or web address] [facility]  Office  [phone]  CC: Primary care physician; Lillard Anes, MD

## 2016-04-05 ENCOUNTER — Inpatient Hospital Stay
Admit: 2016-04-05 | Discharge: 2016-04-05 | Disposition: A | Discharge status: Home / Self Care | Payer: Medicare Other | Attending: Internal Medicine | Admitting: Internal Medicine

## 2016-04-05 LAB — CBC
HCT: 22.4 % — ABNORMAL LOW (ref 35.0–47.0)
Hemoglobin: 7.7 g/dL — ABNORMAL LOW (ref 12.0–16.0)
MCH: 43.5 pg — ABNORMAL HIGH (ref 26.0–34.0)
MCHC: 34.6 g/dL (ref 32.0–36.0)
MCV: 125.8 fL — ABNORMAL HIGH (ref 80.0–100.0)
PLATELETS: 154 10*3/uL (ref 150–440)
RBC: 1.78 MIL/uL — AB (ref 3.80–5.20)
RDW: 20.4 % — AB (ref 11.5–14.5)
WBC: 4.1 10*3/uL (ref 3.6–11.0)

## 2016-04-05 LAB — ECHOCARDIOGRAM COMPLETE
Height: 65 in
WEIGHTICAEL: 3072 [oz_av]

## 2016-04-05 MED ORDER — ADULT MULTIVITAMIN W/MINERALS CH: 1.0000 | ORAL_TABLET | Freq: Every day | ORAL | Status: DC

## 2016-04-05 MED ORDER — ADULT MULTIVITAMIN W/MINERALS CH
1.0000 | ORAL_TABLET | Freq: Every day | ORAL | Status: DC
  Administered 2016-04-05: 1 via ORAL
  Filled 2016-04-05: qty 1

## 2016-04-05 NOTE — Discharge Summary (Signed)
[name] NAME: Sydney Ayala    MR#:  [id number]  DATE OF BIRTH:  Dec 07, 1946  DATE OF ADMISSION:  04/03/2016 ADMITTING PHYSICIAN: Lance Coon, MD  DATE OF DISCHARGE: 04/05/16  PRIMARY CARE PHYSICIAN: Lillard Anes, MD    ADMISSION DIAGNOSIS:  Acute GI bleeding [K92.2] Right sided abdominal pain [R10.9] Atrial fibrillation with rapid ventricular response (HCC) [I48.91] Anemia, unspecified anemia type [D64.9] Diarrhea, unspecified type [R19.7]  DISCHARGE DIAGNOSIS:  History of fibrillation with RVR resolved acute transient Nausea vomiting diarrhea resolved Anemia workup will be done as outpatient History of hypothyroidism  SECONDARY DIAGNOSIS:   Past Medical History  Diagnosis Date  . Hypothyroidism   . Ganglion cyst LEFT RING FINGER  . Chronic low back pain LUMBAR    RELEIVE W/ TENS UNIT PRN AND CHRIOPACTOR  . Left hand weakness   . Numbness and tingling BILATERAL ARMS SECONDARY TO CERVICAL PINCHED NERVE  . Pinched nerve in neck   . Heart murmur ASYMPTOMATIC  . History of rheumatic fever CHILDHOOD  . Short of breath on exertion   . Arthritis BACK, SHOULDER  . H/O tenderness in limb BILATEARL LEGS AND HEELS DUE TO TENDINITIS AND SPURS  . Mild acid reflux   . H/O hiatal hernia   . Urge urinary incontinence   . Snores   . Varicose veins     Left Leg    HOSPITAL COURSE:  Sydney Ayala is a 70 y.o. female who presents with Persistent diarrhea for the last several weeks to months, as well as development of nausea and vomiting today. She's had increasing weakness over the past several weeks as well. She had a significant episode of lightheadedness today which prompted her to come to the ED for evaluation. Here she was found to be significantly anemic with a hemoglobin of 8.6, last known value in our system was from a couple years ago but was well within normal range and 14  1. acute transient atrial  fibrillation with RVR ([place]) - patient responded some to initial Cardizem IV bolus but her heart rate was still not at goal, and due to her blood pressure she was initiated on a slower infusion of Cardizem.  -Patient has been off IV Cardizem drip - Patient has converted to sinus rhythm. Echo is within normal limits with EF of 50-65%. No indication for rate blocking agents. Patient has been sinus rhythm for more than 24 hours. This was likely due to stress from gastroenteritis. Discussed with cardiology agrees with the plan. -Echo EF 50-65%.  2  Nausea, vomiting and diarrhea likely viral. Patient states she initially had a "stomach flu" a couple months ago. After that time she has had episodes of diarrhea. This is gotten worse over the last 2-3 weeks.  C. difficile and GI panel negative  Tolerating by mouth diet  3. Anemia - unclear etiology, she is guaiac positive but with no report of any overt bleed or melena. She certainly seems to report some symptoms from her anemia. However, do not feel like she needs a blood transfusion at this time.  -Patient underwent EGD which is essentially negative. GI recommendations per Dr. Lottie Dawson HL patient will get her colonoscopy done as outpatient.  4. Hypothyroidism - continue home dose thyroid replacement   5. GERD (gastroesophageal reflux disease) - initiate PPI  Have patient see physical therapy prior to discharge. Discharge plan discussed with patient. She is agreeable to it. CONSULTS OBTAINED:  Treatment Team:  Teodoro Spray, MD Lucilla Lame, MD  DRUG ALLERGIES:   Allergies  Allergen Reactions  . Latex Rash  . Codeine Nausea And Vomiting  . Contrast Media [Iodinated Diagnostic Agents] Nausea And Vomiting and Other (See Comments)    "CONVULSIONS"  . Adhesive [Tape] Rash and Other (See Comments)    BURNS SKIN    DISCHARGE MEDICATIONS:   Current Discharge Medication List    START taking these medications   Details  Multiple Vitamin  (MULTIVITAMIN WITH MINERALS) TABS tablet Take 1 tablet by mouth daily. Qty: 30 tablet, Refills: 0      CONTINUE these medications which have NOT CHANGED   Details  acetaminophen (TYLENOL) 325 MG tablet Take 650 mg by mouth every 6 (six) hours as needed for moderate pain or headache.    !! levothyroxine (SYNTHROID, LEVOTHROID) 125 MCG tablet Take 125 mcg by mouth every other day. To alternate with 164mcg dose    !! levothyroxine (SYNTHROID, LEVOTHROID) 150 MCG tablet Take 150 mcg by mouth every other day. To alternate with 135mcg dose     !! - Potential duplicate medications found. Please discuss with provider.      If you experience worsening of your admission symptoms, develop shortness of breath, life threatening emergency, suicidal or homicidal thoughts you must seek medical attention immediately by calling 911 or calling your MD immediately  if symptoms less severe.  You Must read complete instructions/literature along with all the possible adverse reactions/side effects for all the Medicines you take and that have been prescribed to you. Take any new Medicines after you have completely understood and accept all the possible adverse reactions/side effects.   Please note  You were cared for by a hospitalist during your hospital stay. If you have any questions about your discharge medications or the care you received while you were in the hospital after you are discharged, you can call the unit and asked to speak with the hospitalist on call if the hospitalist that took care of you is not available. Once you are discharged, your primary care physician will handle any further medical issues. Please note that NO REFILLS for any discharge medications will be authorized once you are discharged, as it is imperative that you return to your primary care physician (or establish a relationship with a primary care physician if you do not have one) for your aftercare needs so that they can reassess your  need for medications and monitor your lab values. Today   SUBJECTIVE   Doing well no complaints  VITAL SIGNS:  Blood pressure 108/41, pulse 84, temperature 98.4 F (36.9 C), temperature source Oral, resp. rate 15, height 5\' 5"  (1.651 m), weight 87.091 kg (192 lb), SpO2 91 %.  I/O:   Intake/Output Summary (Last 24 hours) at 04/05/16 1114 Last data filed at 04/05/16 0830  Gross per 24 hour  Intake    610 ml  Output   1125 ml  Net   -515 ml    PHYSICAL EXAMINATION:  GENERAL:  70 y.o.-year-old patient lying in the bed with no acute distress. Generalized pallor. EYES: Pupils equal, round, reactive to light and accommodation. No scleral icterus. Extraocular muscles intact.  HEENT: Head atraumatic, normocephalic. Oropharynx and nasopharynx clear.  NECK:  Supple, no jugular venous distention. No thyroid enlargement, no tenderness.  LUNGS: Normal breath sounds bilaterally, no wheezing, rales,rhonchi or crepitation. No use of accessory muscles of respiration.  CARDIOVASCULAR: S1, S2 normal. No murmurs, rubs, or gallops.  ABDOMEN: Soft, non-tender, non-distended.  Bowel sounds present. No organomegaly or mass.  EXTREMITIES: No pedal edema, cyanosis, or clubbing.  NEUROLOGIC: Cranial nerves II through XII are intact. Muscle strength 5/5 in all extremities. Sensation intact. Gait not checked.  PSYCHIATRIC: The patient is alert and oriented x 3.  SKIN: No obvious rash, lesion, or ulcer.   DATA REVIEW:   CBC   Recent Labs Lab 04/05/16 0424  WBC 4.1  HGB 7.7*  HCT 22.4*  PLT 154    Chemistries   Recent Labs Lab 04/03/16 2028 04/04/16 0454  NA 140 143  K 3.4* 3.9  CL 109 113*  CO2 25 26  GLUCOSE 106* 100*  BUN 18 12  CREATININE 0.73 0.69  CALCIUM 8.8* 8.0*  MG 1.9  --   AST 24  --   ALT 15  --   ALKPHOS 51  --   BILITOT 1.8*  --     Microbiology Results   Recent Results (from the past 240 hour(s))  C difficile quick scan w PCR reflex     Status: None   Collection  Time: 04/04/16  5:30 AM  Result Value Ref Range Status   C Diff antigen NEGATIVE NEGATIVE Final   C Diff toxin NEGATIVE NEGATIVE Final   C Diff interpretation Negative for C. difficile  Final  Gastrointestinal Panel by PCR , Stool     Status: None   Collection Time: 04/04/16  5:30 AM  Result Value Ref Range Status   Campylobacter species NOT DETECTED NOT DETECTED Final   Plesimonas shigelloides NOT DETECTED NOT DETECTED Final   Salmonella species NOT DETECTED NOT DETECTED Final   Yersinia enterocolitica NOT DETECTED NOT DETECTED Final   Vibrio species NOT DETECTED NOT DETECTED Final   Vibrio cholerae NOT DETECTED NOT DETECTED Final   Enteroaggregative E coli (EAEC) NOT DETECTED NOT DETECTED Final   Enteropathogenic E coli (EPEC) NOT DETECTED NOT DETECTED Final   Enterotoxigenic E coli (ETEC) NOT DETECTED NOT DETECTED Final   Shiga like toxin producing E coli (STEC) NOT DETECTED NOT DETECTED Final   E. coli O157 NOT DETECTED NOT DETECTED Final   Shigella/Enteroinvasive E coli (EIEC) NOT DETECTED NOT DETECTED Final   Cryptosporidium NOT DETECTED NOT DETECTED Final   Cyclospora cayetanensis NOT DETECTED NOT DETECTED Final   Entamoeba histolytica NOT DETECTED NOT DETECTED Final   Giardia lamblia NOT DETECTED NOT DETECTED Final   Adenovirus F40/41 NOT DETECTED NOT DETECTED Final   Astrovirus NOT DETECTED NOT DETECTED Final   Norovirus GI/GII NOT DETECTED NOT DETECTED Final   Rotavirus A NOT DETECTED NOT DETECTED Final   Sapovirus (I, II, IV, and V) NOT DETECTED NOT DETECTED Final    RADIOLOGY:  Ct Abdomen Pelvis Wo Contrast  04/03/2016  CLINICAL DATA:  Right-sided abdominal pain with vomiting and diarrhea for 2 months. Oral contrast only due to IV contrast allergy. EXAM: CT ABDOMEN AND PELVIS WITHOUT CONTRAST TECHNIQUE: Multidetector CT imaging of the abdomen and pelvis was performed following the standard protocol without IV contrast. COMPARISON:  None. FINDINGS: Mild dependent  atelectasis in the lung bases. There is a 7 mm noncalcified nodule with the left lung base anteriorly. Non-contrast chest CT at 6-12 months is recommended. If the nodule is stable at time of repeat CT, then future CT at 18-24 months (from today's scan) is considered optional for low-risk patients, but is recommended for high-risk patients. This recommendation follows the consensus statement: Guidelines for Management of Incidental Pulmonary Nodules Detected on CT Images:From the Fleischner Society 2017;  published online before print (10.1148/radiol.IJ:2314499). Residual contrast material in the lower esophagus may represent reflux or dysmotility. Surgical absence of the gallbladder. No bile duct dilatation. The unenhanced appearance of the liver, spleen, pancreas, adrenal glands, kidneys, abdominal aorta, inferior vena cava, and retroperitoneal lymph nodes is unremarkable. Scattered mesenteric axis and celiac axis lymph nodes are not pathologically enlarged. Stomach, small bowel, and colon are not abnormally distended. Contrast material flows through to the colon without evidence of bowel obstruction. No free air or free fluid in the abdomen. Pelvis: Appendix is normal. Bladder wall is mildly thickened. There is a small amount of gas in the bladder. In the absence of instrumentation, this is likely due to cystitis. Uterus is surgically absent. No abnormal adnexal masses. No pelvic mass or lymphadenopathy. No free or loculated pelvic fluid collections. Degenerative changes in the spine. No destructive bone lesions. IMPRESSION: 7 mm noncalcified nodule left lung base anteriorly. See above followup recommendations. Residual contrast material in the lower esophagus may represent reflux or dysmotility. Bladder wall thickening and small amount of gas in the bladder suggest cystitis. Electronically Signed   By: Lucienne Capers M.D.   On: 04/03/2016 23:40   Dg Chest 2 View  04/03/2016  CLINICAL DATA:  Nausea and  vomiting for 2 months EXAM: CHEST  2 VIEW COMPARISON:  None. FINDINGS: The heart size and mediastinal contours are within normal limits. Both lungs are clear. The visualized skeletal structures are unremarkable. IMPRESSION: No active cardiopulmonary disease. Electronically Signed   By: Inez Catalina M.D.   On: 04/03/2016 20:57     Management plans discussed with the patient, family and they are in agreement.  CODE STATUS:     Code Status Orders        Start     Ordered   04/04/16 0122  Full code   Continuous     04/04/16 0121    Code Status History    Date Active Date Inactive Code Status Order ID Comments User Context   This patient has a current code status but no historical code status.      TOTAL TIME TAKING CARE OF THIS PATIENT: 40 minutes.    Azyriah Nevins M.D on 04/05/2016 at 11:14 AM  Between 7am to 6pm - Pager - [phone] After 6pm go to [email or web address] - password [email or web address] [facility]  Office  [phone]  CC: Primary care physician; Lillard Anes, MD

## 2016-04-05 NOTE — Care Management Note (Signed)
Case Management Note  Patient Details  Name: Sydney Ayala MRN: [id number] Date of Birth: 1946-09-14  Subjective/Objective:    Spoke with [name] and with [name] about discharge planning. [name] will be staying with her son for a few days after this hospital discharge at [address] [place], [place], [address], Ph: [phone]. Then she will return to her home address at [address] [place], [place] [address] Ph: [phone]. Daniel at [facility] stated that [name] is within the [facility] service area for home health PT. A request for a rolling walker was faxed and called to [place] to be delivered to [name] in her [facility] hospital room [facility] today.                  Action/Plan:   Expected Discharge Date:                  Expected Discharge Plan:  Home/Self Care  In-House Referral:     Discharge planning Services  Homebound not met per provider, CM Consult  Post Acute Care Choice:    Choice offered to:     DME Arranged:    DME Agency:     HH Arranged:    HH Agency:     Status of Service:  In process, will continue to follow  Medicare Important Message Given:    Date Medicare IM Given:    Medicare IM give by:    Date Additional Medicare IM Given:    Additional Medicare Important Message give by:     If discussed at [place] of Stay Meetings, dates discussed:    Additional Comments:  Jonathandavid Marlett A, RN 04/05/2016, 1:12 PM

## 2016-04-05 NOTE — Progress Notes (Signed)
Dr. Posey Pronto aware that PT recommends home w/ home health. Pt also needs a walker delivered prior to discharge. Pt to be discharged after arrival of walker. Family has been updated by [facility] RN

## 2016-04-05 NOTE — Consult Note (Signed)
Consultation Follow Up Note  Referring Provider:     No ref. provider found Primary Care Physician:  Lillard Anes, MD Primary Gastroenterologist:  None         Reason for Consultation:     Diarrhea with nausea vomiting  Date of Admission:  04/03/2016 Date of follow up:  04/05/2016         HPI:   Sydney Ayala is a 70 y.o. female who was admitted with several weeks to months of nausea with vomiting and persistent diarrhea. The patient was found to have a hemoglobin of 8.6 and was found to have heme positive stools. EGD was negative, and she notes that her pain is somewhat better today, though she is coughing a lot.   Review of Systems:    All systems reviewed and negative except where noted in HPI.   Physical Exam:  Vital signs in last 24 hours: Temp:  [97.2 F (36.2 C)-98.5 F (36.9 C)] 98.4 F (36.9 C) (04/22 0414) Pulse Rate:  [63-96] 84 (04/22 0414) Resp:  [12-20] 15 (04/22 0414) BP: (86-125)/(41-58) 108/41 mmHg (04/22 0414) SpO2:  [91 %-95 %] 91 % (04/22 0414) Weight:  [87.091 kg (192 lb)] 87.091 kg (192 lb) (04/21 1329) Last BM Date: 04/04/16 General:   Pleasant, cooperative in NAD Head:  Normocephalic and atraumatic. Eyes:   No icterus.   Conjunctiva pink. PERRLA. Ears:  Normal auditory acuity. Neck:  Supple; no masses or thyroidomegaly Lungs: Respirations even and unlabored. Lungs clear to auscultation bilaterally.   No wheezes, crackles, or rhonchi.  Heart:  Regular rate and rhythm;  Without murmur, clicks, rubs or gallops Abdomen:  Soft, nondistended, nontender. Normal bowel sounds. No appreciable masses or hepatomegaly.  No rebound or guarding.  Rectal:  Not performed. Msk:  Symmetrical without gross deformities.  Strength  Extremities:  Without edema, cyanosis or clubbing. Neurologic:  Alert and oriented x3;  grossly normal neurologically. Skin:  Intact without significant lesions or rashes. Psych:  Alert and cooperative. Normal affect.  LAB  RESULTS:  Recent Labs  04/03/16 2028 04/04/16 0454 04/05/16 0424  WBC 3.7 3.4* 4.1  HGB 8.6* 7.2* 7.7*  HCT 24.7* 20.8* 22.4*  PLT 169 135* 154   BMET  Recent Labs  04/03/16 2028 04/04/16 0454  NA 140 143  K 3.4* 3.9  CL 109 113*  CO2 25 26  GLUCOSE 106* 100*  BUN 18 12  CREATININE 0.73 0.69  CALCIUM 8.8* 8.0*   LFT  Recent Labs  04/03/16 2028  PROT 6.8  ALBUMIN 4.3  AST 24  ALT 15  ALKPHOS 51  BILITOT 1.8*   PT/INR  Recent Labs  04/03/16 2137  LABPROT 15.0  INR 1.16    STUDIES: Ct Abdomen Pelvis Wo Contrast  04/03/2016  CLINICAL DATA:  Right-sided abdominal pain with vomiting and diarrhea for 2 months. Oral contrast only due to IV contrast allergy. EXAM: CT ABDOMEN AND PELVIS WITHOUT CONTRAST TECHNIQUE: Multidetector CT imaging of the abdomen and pelvis was performed following the standard protocol without IV contrast. COMPARISON:  None. FINDINGS: Mild dependent atelectasis in the lung bases. There is a 7 mm noncalcified nodule with the left lung base anteriorly. Non-contrast chest CT at 6-12 months is recommended. If the nodule is stable at time of repeat CT, then future CT at 18-24 months (from today's scan) is considered optional for low-risk patients, but is recommended for high-risk patients. This recommendation follows the consensus statement: Guidelines for Management of Incidental Pulmonary Nodules Detected on  CT Images:From the Fleischner Society 2017; published online before print (10.1148/radiol.SG:5268862). Residual contrast material in the lower esophagus may represent reflux or dysmotility. Surgical absence of the gallbladder. No bile duct dilatation. The unenhanced appearance of the liver, spleen, pancreas, adrenal glands, kidneys, abdominal aorta, inferior vena cava, and retroperitoneal lymph nodes is unremarkable. Scattered mesenteric axis and celiac axis lymph nodes are not pathologically enlarged. Stomach, small bowel, and colon are not abnormally  distended. Contrast material flows through to the colon without evidence of bowel obstruction. No free air or free fluid in the abdomen. Pelvis: Appendix is normal. Bladder wall is mildly thickened. There is a small amount of gas in the bladder. In the absence of instrumentation, this is likely due to cystitis. Uterus is surgically absent. No abnormal adnexal masses. No pelvic mass or lymphadenopathy. No free or loculated pelvic fluid collections. Degenerative changes in the spine. No destructive bone lesions. IMPRESSION: 7 mm noncalcified nodule left lung base anteriorly. See above followup recommendations. Residual contrast material in the lower esophagus may represent reflux or dysmotility. Bladder wall thickening and small amount of gas in the bladder suggest cystitis. Electronically Signed   By: Lucienne Capers M.D.   On: 04/03/2016 23:40   Dg Chest 2 View  04/03/2016  CLINICAL DATA:  Nausea and vomiting for 2 months EXAM: CHEST  2 VIEW COMPARISON:  None. FINDINGS: The heart size and mediastinal contours are within normal limits. Both lungs are clear. The visualized skeletal structures are unremarkable. IMPRESSION: No active cardiopulmonary disease. Electronically Signed   By: Inez Catalina M.D.   On: 04/03/2016 20:57      Impression / Plan:   Sydney Ayala is a 70 y.o. y/o female with With a history of nausea and vomiting with chronic diarrhea and a 8 pound weight loss. Her EGD was relatively reassuring, though she will ultimately need a colonoscopy as well, as previously detailed by Dr. Allen Norris. Would continue to monitor for now, and she can pursue colonoscopy as an outpatient.    Thank you for involving me in the care of this patient.      LOS: 2 days   Fredonia Highland, MD  04/05/2016, 11:57 AM   Note: This dictation was prepared with Dragon dictation along with smaller phrase technology. Any transcriptional errors that result from this process are unintentional.

## 2016-04-05 NOTE — Progress Notes (Signed)
[facility] has arrived with walker. Called pt's son [place] who is en route. Will review discharge instructions when son arrives.

## 2016-04-05 NOTE — Discharge Instructions (Signed)
Call Dr brahmanday's office on Monday to make appt for anemia w/u

## 2016-04-05 NOTE — Progress Notes (Signed)
Pt ambulated to BR with RN. Tolerated well. Denies dizziness after ambulation. Will ambulate pt around unit after pt finishes breakfast.

## 2016-04-05 NOTE — Progress Notes (Signed)
[facility]  SUBJECTIVE: Feels better  Filed Vitals:   04/04/16 1945 04/04/16 1946 04/05/16 0006 04/05/16 0414  BP: 95/44 97/46 112/55 108/41  Pulse: 74 77 78 84  Temp: 98.5 F (36.9 C)   98.4 F (36.9 C)  TempSrc: Oral   Oral  Resp: 18   15  Height:      Weight:      SpO2: 93% 94%  91%    Intake/Output Summary (Last 24 hours) at 04/05/16 1128 Last data filed at 04/05/16 0830  Gross per 24 hour  Intake    610 ml  Output   1125 ml  Net   -515 ml    LABS: Basic Metabolic Panel:  Recent Labs  04/03/16 2028 04/04/16 0454  NA 140 143  K 3.4* 3.9  CL 109 113*  CO2 25 26  GLUCOSE 106* 100*  BUN 18 12  CREATININE 0.73 0.69  CALCIUM 8.8* 8.0*  MG 1.9  --    Liver Function Tests:  Recent Labs  04/03/16 2028  AST 24  ALT 15  ALKPHOS 51  BILITOT 1.8*  PROT 6.8  ALBUMIN 4.3    Recent Labs  04/03/16 2028  LIPASE 17   CBC:  Recent Labs  04/03/16 2028 04/04/16 0454 04/05/16 0424  WBC 3.7 3.4* 4.1  NEUTROABS 1.9  --   --   HGB 8.6* 7.2* 7.7*  HCT 24.7* 20.8* 22.4*  MCV 123.3* 122.0* 125.8*  PLT 169 135* 154   Cardiac Enzymes:  Recent Labs  04/03/16 2028  TROPONINI <0.03   BNP: Invalid input(s): POCBNP D-Dimer: No results for input(s): DDIMER in the last 72 hours. Hemoglobin A1C: No results for input(s): HGBA1C in the last 72 hours. Fasting Lipid Panel: No results for input(s): CHOL, HDL, LDLCALC, TRIG, CHOLHDL, LDLDIRECT in the last 72 hours. Thyroid Function Tests:  Recent Labs  04/03/16 2028  TSH 6.561*   Anemia Panel:  Recent Labs  04/04/16 0454  VITAMINB12 50*  TIBC 209*  IRON 133     Physical Exam: Blood pressure 108/41, pulse 84, temperature 98.4 F (36.9 C), temperature source Oral, resp. rate 15, height 5\' 5"  (1.651 m), weight 87.091 kg (192 lb), SpO2 91 %.   Wt Readings from Last 1 Encounters:  04/04/16 87.091 kg (192 lb)     General appearance: alert and cooperative Resp:  clear to auscultation bilaterally Cardio: regular rate and rhythm Extremities: extremities normal, atraumatic, no cyanosis or edema Neurologic: Grossly normal  TELEMETRY: Reviewed telemetry pt in nsr  ASSESSMENT AND PLAN:  Principal Problem:   Paroxysmal atrial fibrillation with RVR (HCC) currently in normal sinus rhythm. Atrial fibrillation likely secondary to diarrhea nausea and vomiting. Guaiac-positive stools. Further outpatient GI workup is pending. Not a candidate for chronic anticoagulation due to guaiac-positive stools. Would agree with discharge off of antiarrhythmic with outpatient follow-up as needed. Active Problems:   Nausea, vomiting and diarrhea   Hypothyroidism   GERD (gastroesophageal reflux disease)   Amenia   Blood in stool   Intractable cyclical vomiting with nausea   Acute GI bleeding   Absolute anemia   Diarrhea    Teodoro Spray., MD, [facility] 04/05/2016 11:28 AM

## 2016-04-05 NOTE — Progress Notes (Signed)
Pt ready for discharge. Family members are present. IV and tele removed. Pt is dressed. Discharge paper work has been printed but not given to pt yet. Pt is waiting on delivery of walker. [place], was informed that delivery hours are between 5pm-9pm. Pt updated on status of walker. Pt is willingly to wait. When walker is delivered pt's son [place] must be called for transportation and discharge instructions.

## 2016-04-05 NOTE — Clinical Documentation Improvement (Signed)
Internal Medicine  Can the diagnosis of anemia be further specified?   Acute Blood Loss Anemia, including the suspected or known cause or associated condition(s)  Acute on chronic blood loss anemia, including the suspected or known cause or associated condition(s)  Chronic blood loss anemia, including the suspected or known cause or associated condition(s)  Precipitous drop in Hematocrit, including the suspected or known cause or associated condition(s)  Other  Clinically Undetermined  Document any associated diagnoses/conditions. Please update your documentation within the medical record to reflect your response to this query. Thank you.  Supporting Information:(As per notes) GI Bleed, "anemia - unclear etiology, she is guaiac positive but with no report of any overt bleed"   Labs: Component     Latest Ref Rng 04/03/2016 04/04/2016 04/05/2016  Hemoglobin     12.0 - 16.0 g/dL 8.6 (L) 7.2 (L) 7.7 (L)  HCT     35.0 - 47.0 % 24.7 (L) 20.8 (L) 22.4 (L)   Please exercise your independent, professional judgment when responding. A specific answer is not anticipated or expected.   Thank You, Alessandra Grout, RN, BSN, CCDS,Clinical Documentation Specialist:  [phone]  [phone]=[facility]- Health Information Management

## 2016-04-05 NOTE — Progress Notes (Signed)
Spoke with Dr. Ubaldo Glassing  Per pt and family request. Explained why po cardizem was not ordered. Pt and family to call Dr. Ubaldo Glassing for appt. Pt discharged by RN

## 2016-04-05 NOTE — Progress Notes (Signed)
Daughter in law Velna Hatchet was notified that pt was taken for ECHO. Family appreciative of call

## 2016-04-05 NOTE — Evaluation (Signed)
Physical Therapy Evaluation Patient Details Name: Sydney Ayala MRN: [id number] DOB: [birth year] Today's Date: 04/05/2016   History of Present Illness  70 yo female with multiple lumbar spine surgeries and neck pain was admitted for GI bleed and now PT referred to assess gait.  Has PMHx:  a-fib, neck and back pain  Clinical Impression  Pt is demonstrating some instability with her gait and R foot/ankle weakness with exacerbation of anemia from GI bleed.  Will likely leave today but recommend PT continue to strengthen and increase safety awareness and balance for avoidance of injury once home.    Follow Up Recommendations Home health PT    Equipment Recommendations  Rolling walker with 5" wheels    Recommendations for Other Services Rehab consult     Precautions / Restrictions Precautions Precautions: Fall (telemetry) Restrictions Weight Bearing Restrictions: No      Mobility  Bed Mobility Overal bed mobility: Needs Assistance Bed Mobility: Sidelying to Sit   Sidelying to sit: Min guard       General bed mobility comments: using elevated HOB  Transfers Overall transfer level: Needs assistance Equipment used: Rolling walker (2 wheeled);1 person hand held assist Transfers: Sit to/from [facility] Sit to Stand: Min guard;Min assist Stand pivot transfers: Min guard;Min assist       General transfer comment: reminders for hand placement  Ambulation/Gait Ambulation/Gait assistance: Min guard Ambulation Distance (Feet): 150 Feet Assistive device: Rolling walker (2 wheeled);1 person hand held assist Gait Pattern/deviations: Step-through pattern;Step-to pattern;Wide base of support;Decreased dorsiflexion - right;Decreased weight shift to right (fast turns on walker unless cued) Gait velocity: too fast for safety Gait velocity interpretation: at or above normal speed for age/gender    Stairs            Wheelchair Mobility    Modified Rankin  (Stroke Patients Only)       Balance Overall balance assessment: History of Falls;Needs assistance Sitting-balance support: Feet supported Sitting balance-Leahy Scale: Fair   Postural control: Posterior lean Standing balance support: Bilateral upper extremity supported Standing balance-Leahy Scale: Poor                               Pertinent Vitals/Pain Pain Assessment: Faces Pain Score: 3  Pain Location: back and neck Pain Descriptors / Indicators: Aching;Sore Pain Intervention(s): Monitored during session;Premedicated before session;Repositioned    Home Living Family/patient expects to be discharged to:: Private residence Living Arrangements: Children Available Help at Discharge: Family;Available PRN/intermittently Type of Home: House Home Access: Stairs to enter Entrance Stairs-Rails: Psychiatric nurse of Steps: 1 Home Layout: One level Home Equipment: Walker - 4 wheels      Prior Function Level of Independence: Independent (but using walls and furniture for support)               Hand Dominance   Dominant Hand: Right    Extremity/Trunk Assessment   Upper Extremity Assessment: Overall WFL for tasks assessed           Lower Extremity Assessment: RLE deficits/detail RLE Deficits / Details: weakness R foot and ankle       Communication   Communication: No difficulties  Cognition Arousal/Alertness: Awake/alert Behavior During Therapy: WFL for tasks assessed/performed Overall Cognitive Status: Within Functional Limits for tasks assessed                      General Comments General comments (skin integrity, edema, etc.):  Asked pt to use controlled pace to walk, slower pivots and not to walk without AD.  She will go home with son for now to monitor her safety    Exercises        Assessment/Plan    PT Assessment Patient needs continued PT services  PT Diagnosis Difficulty walking   PT Problem List  Decreased strength;Decreased range of motion;Decreased activity tolerance;Decreased balance;Decreased mobility;Decreased coordination;Decreased knowledge of use of DME;Decreased safety awareness;Decreased knowledge of precautions;Cardiopulmonary status limiting activity;Obesity;Pain  PT Treatment Interventions DME instruction;Gait training;Stair training;Functional mobility training;Therapeutic activities;Therapeutic exercise;Balance training;Neuromuscular re-education;Patient/family education   PT Goals (Current goals can be found in the Care Plan section) Acute Rehab PT Goals Patient Stated Goal: to walk and get home PT Goal Formulation: With patient/family Time For Goal Achievement: 04/19/16 Potential to Achieve Goals: Good    Frequency Min 2X/week   Barriers to discharge Inaccessible home environment;Decreased caregiver support has a daughter who is not always there    Co-evaluation               End of Session Equipment Utilized During Treatment: Gait belt Activity Tolerance: Patient tolerated treatment well;Other (comment) (limited by need for walker) Patient left: in bed;with call bell/phone within reach;with family/visitor present Nurse Communication: Mobility status         Time: 1135-1202 PT Time Calculation (min) (ACUTE ONLY): 27 min   Charges:   PT Evaluation $PT Eval Low Complexity: 1 Procedure PT Treatments $Gait Training: 8-22 mins   PT G Codes:        Ramond Dial 27-Apr-2016, 12:20 PM   Mee Hives, PT MS Acute Rehab Dept. Number: [facility] [phone] and [place] [phone]

## 2016-04-07 LAB — FOLATE RBC
FOLATE, HEMOLYSATE: 246.2 ng/mL
Folate, RBC: 1140 ng/mL (ref >498)
Hematocrit: 21.6 % — ABNORMAL LOW (ref 34.0–46.6)

## 2016-04-08 ENCOUNTER — Encounter: Payer: Self-pay | Admitting: *Deleted

## 2016-04-08 ENCOUNTER — Other Ambulatory Visit: Payer: Self-pay

## 2016-04-08 ENCOUNTER — Telehealth: Payer: Self-pay

## 2016-04-08 NOTE — Telephone Encounter (Signed)
Pt scheduled for colonoscopy at [facility] on 04/17/16. Instructs mailed to pt's daughter in [place] home. [address], [place], [place] [address].

## 2016-04-09 ENCOUNTER — Encounter: Payer: Self-pay | Admitting: Gastroenterology

## 2016-04-09 ENCOUNTER — Inpatient Hospital Stay: Payer: Medicare Other | Attending: Internal Medicine | Admitting: Internal Medicine

## 2016-04-09 ENCOUNTER — Other Ambulatory Visit: Payer: Self-pay

## 2016-04-09 ENCOUNTER — Inpatient Hospital Stay: Payer: Medicare Other

## 2016-04-09 VITALS — BP 119/75 | HR 88 | Temp 98.7°F | Wt 189.6 lb

## 2016-04-09 DIAGNOSIS — R17 Unspecified jaundice: Secondary | ICD-10-CM

## 2016-04-09 DIAGNOSIS — I739 Peripheral vascular disease, unspecified: Secondary | ICD-10-CM | POA: Diagnosis not present

## 2016-04-09 DIAGNOSIS — D510 Vitamin B12 deficiency anemia due to intrinsic factor deficiency: Secondary | ICD-10-CM

## 2016-04-09 DIAGNOSIS — G8929 Other chronic pain: Secondary | ICD-10-CM | POA: Diagnosis not present

## 2016-04-09 DIAGNOSIS — D518 Other vitamin B12 deficiency anemias: Secondary | ICD-10-CM

## 2016-04-09 DIAGNOSIS — Z87891 Personal history of nicotine dependence: Secondary | ICD-10-CM | POA: Insufficient documentation

## 2016-04-09 DIAGNOSIS — M1990 Unspecified osteoarthritis, unspecified site: Secondary | ICD-10-CM | POA: Diagnosis not present

## 2016-04-09 DIAGNOSIS — E039 Hypothyroidism, unspecified: Secondary | ICD-10-CM | POA: Diagnosis not present

## 2016-04-09 DIAGNOSIS — G629 Polyneuropathy, unspecified: Secondary | ICD-10-CM

## 2016-04-09 DIAGNOSIS — K219 Gastro-esophageal reflux disease without esophagitis: Secondary | ICD-10-CM | POA: Diagnosis not present

## 2016-04-09 DIAGNOSIS — Z79899 Other long term (current) drug therapy: Secondary | ICD-10-CM

## 2016-04-09 DIAGNOSIS — D649 Anemia, unspecified: Secondary | ICD-10-CM | POA: Diagnosis not present

## 2016-04-09 DIAGNOSIS — D519 Vitamin B12 deficiency anemia, unspecified: Secondary | ICD-10-CM

## 2016-04-09 DIAGNOSIS — I872 Venous insufficiency (chronic) (peripheral): Secondary | ICD-10-CM | POA: Insufficient documentation

## 2016-04-09 DIAGNOSIS — D539 Nutritional anemia, unspecified: Secondary | ICD-10-CM

## 2016-04-09 DIAGNOSIS — K922 Gastrointestinal hemorrhage, unspecified: Secondary | ICD-10-CM | POA: Diagnosis not present

## 2016-04-09 DIAGNOSIS — M545 Low back pain: Secondary | ICD-10-CM | POA: Diagnosis not present

## 2016-04-09 DIAGNOSIS — I480 Paroxysmal atrial fibrillation: Secondary | ICD-10-CM | POA: Diagnosis not present

## 2016-04-09 DIAGNOSIS — R2689 Other abnormalities of gait and mobility: Secondary | ICD-10-CM | POA: Diagnosis not present

## 2016-04-09 DIAGNOSIS — R197 Diarrhea, unspecified: Secondary | ICD-10-CM | POA: Diagnosis not present

## 2016-04-09 LAB — HEPATIC FUNCTION PANEL
ALBUMIN: 4.6 g/dL (ref 3.5–5.0)
ALK PHOS: 58 U/L (ref 38–126)
ALT: 16 U/L (ref 14–54)
AST: 24 U/L (ref 15–41)
BILIRUBIN DIRECT: 0.2 mg/dL (ref 0.1–0.5)
BILIRUBIN TOTAL: 1.5 mg/dL — AB (ref 0.3–1.2)
Indirect Bilirubin: 1.3 mg/dL — ABNORMAL HIGH (ref 0.3–0.9)
Total Protein: 7.7 g/dL (ref 6.5–8.1)

## 2016-04-09 LAB — CBC WITH DIFFERENTIAL/PLATELET
BASOS ABS: 0 10*3/uL (ref 0–0.1)
Basophils Relative: 1 %
EOS PCT: 4 %
Eosinophils Absolute: 0.2 10*3/uL (ref 0–0.7)
HEMATOCRIT: 24.7 % — AB (ref 35.0–47.0)
HEMOGLOBIN: 8.8 g/dL — AB (ref 12.0–16.0)
LYMPHS ABS: 1.5 10*3/uL (ref 1.0–3.6)
Lymphocytes Relative: 30 %
MCH: 43.6 pg — AB (ref 26.0–34.0)
MCHC: 35.6 g/dL (ref 32.0–36.0)
MCV: 122.3 fL — AB (ref 80.0–100.0)
MONOS PCT: 4 %
Monocytes Absolute: 0.2 10*3/uL (ref 0.2–0.9)
Neutro Abs: 3 10*3/uL (ref 1.4–6.5)
Neutrophils Relative %: 61 %
PLATELETS: 225 10*3/uL (ref 150–440)
RBC: 2.02 MIL/uL — AB (ref 3.80–5.20)
RDW: 20.2 % — ABNORMAL HIGH (ref 11.5–14.5)
WBC: 5 10*3/uL (ref 3.6–11.0)

## 2016-04-09 LAB — LACTATE DEHYDROGENASE: LDH: 418 U/L — ABNORMAL HIGH (ref 98–192)

## 2016-04-09 MED ORDER — CYANOCOBALAMIN 1000 MCG/ML IJ SOLN: INTRAMUSCULAR | Status: DC

## 2016-04-09 MED ORDER — CYANOCOBALAMIN 1000 MCG/ML IJ SOLN
1000.0000 ug | Freq: Once | INTRAMUSCULAR | Status: AC
  Administered 2016-04-09: 1000 ug via INTRAMUSCULAR
  Filled 2016-04-09: qty 1

## 2016-04-09 MED ORDER — "SYRINGE 22G X 1"" 3 ML MISC": 1.0000 | Freq: Once | Status: DC

## 2016-04-09 MED ORDER — POTASSIUM CHLORIDE CRYS ER 20 MEQ PO TBCR: 20.0000 meq | EXTENDED_RELEASE_TABLET | Freq: Once | ORAL | Status: DC

## 2016-04-09 NOTE — Progress Notes (Signed)
[place] NOTE  Patient Care Team: Lillard Anes, MD as PCP - General (Family Medicine)  CHIEF COMPLAINTS/PURPOSE OF CONSULTATION:   #April 2017- MACROCYTIC ANEMIA [B12 =50]  # Peripheral neuropathy- ? B 12 def  # A.fib with RVR- not on anti-coag  HISTORY OF PRESENTING ILLNESS:  Sydney Ayala 70 y.o.  female has been deferred was for further evaluation of macrocytic anemia. Patient was in the hospital recently for multiple complaints including diarrhea abdominal discomfort and nausea vomiting. Her hemoglobin was down to 7.2 MCV; 124 white count platelets normal. She had stool occult positive. She had EGD that was negative. She is awaiting a colonoscopy as outpatient.She was also found to be in A. fib with RVR- currently in sinus rhythm. Did not start anti-coagulation. C. difficile negative.  She feels weak. Abdominal complaints diarrhea improved.  She also complains of tingling and numbness of her extremities going on for the last 1 year. She is currently using a walker; currently is physical therapy/ home health. She is currently living with her son and daughter in law in [place].   ROS: A complete 10 point review of system is done which is negative except mentioned above in history of present illness  MEDICAL HISTORY:  Past Medical History  Diagnosis Date  . Hypothyroidism   . Ganglion cyst LEFT RING FINGER  . Chronic low back pain LUMBAR    RELEIVE W/ TENS UNIT PRN AND CHRIOPACTOR  . Left hand weakness   . Numbness and tingling BILATERAL ARMS SECONDARY TO CERVICAL PINCHED NERVE  . Pinched nerve in neck   . History of rheumatic fever CHILDHOOD  . H/O tenderness in limb BILATEARL LEGS AND HEELS DUE TO TENDINITIS AND SPURS  . Mild acid reflux   . H/O hiatal hernia   . Urge urinary incontinence   . Snores   . Varicose veins     Left Leg  . Short of breath on exertion   . Anemia   . Dysrhythmia     A Fib during hospital stay 04/03/16  .  Heart murmur ASYMPTOMATIC    s/p rheumatic fever  . Peripheral vascular disease (HCC)     legs.   . Chronic venous insufficiency   . Arthritis      Back, shoulders, arms  . Motion sickness     cars    SURGICAL HISTORY: Past Surgical History  Procedure Laterality Date  . Left breast ductal excision  10-11-2009    BENIGN  . Rotator cuff repair  04-11-2008    LEFT SHOULDER  . Vaginal hysterectomy/ anterior & posterior repair/ transobturator sling  04-25-2005    CYSTOCELE/ RECTOCELE/ UTERINE PROLAPSE/ SUI  . Right breast lumpectomy      BENIGN  . Thyroid goiter removed  1970'S  . Dilation and curettage of uterus    . Hemrroidectomy and anal fissure repair  1965  . Left hand surg.  1980'S    EXTENSIVE REPAIR AND REMOVAL OF LIGAMENT/TENDON  (?CANCER)  . Removal right goin mass  1980'S    BENIGN  . Right foot surg  X3    REPAIR OF 2 TOES  . Lumbar laminectomy  1990  &  1976    RIGHT SIDE L4 - 5  & L5 - S1  . Tonsillectomy  CHILD  . Removal toe nailbed  AGE 42    ALL 10 TOES-- DEFORMED  . Cardiovascular stress test  APRIL 2009    NORMAL LVSF/ EF 61%/ NO  ISCHEMIA  . Ganglion cyst excision  06/09/2012    Procedure: REMOVAL GANGLION OF WRIST;  Surgeon: Magnus Sinning, MD;  Location: [facility];  Service: Orthopedics;  Laterality: Left;  EXCISION OF GANGLION CYST LEFT RING FINGER  IV REGIONAL ANESTHESIA  . Abdominal hysterectomy    . Laparoscopic cholecystectomy  05-13-2002    Gall Bladder  . Spine surgery    . Esophagogastroduodenoscopy (egd) with propofol N/A 04/04/2016    Procedure: ESOPHAGOGASTRODUODENOSCOPY (EGD) WITH PROPOFOL;  Surgeon: Lucilla Lame, MD;  Location: [facility] ENDOSCOPY;  Service: Endoscopy;  Laterality: N/A;    SOCIAL HISTORY: Social History   Social History  . Marital Status: Divorced    Spouse Name: N/A  . Number of Children: N/A  . Years of Education: N/A   Occupational History  . Not on file.   Social History Main Topics  .  Smoking status: Former Smoker -- 40 years    Types: Cigarettes    Quit date: 06/04/1991  . Smokeless tobacco: Never Used  . Alcohol Use: No  . Drug Use: No  . Sexual Activity: Not on file   Other Topics Concern  . Not on file   Social History Narrative    FAMILY HISTORY: Family History  Problem Relation Age of Onset  . Deep vein thrombosis Mother     Right Leg  . Heart disease Mother     Before age 68-  CHF  . Varicose Veins Mother   . Heart attack Mother   . Heart disease Father     Before age 41-  PVD  . Heart attack Father   . Arthritis Father     Gout  . Hypertension Father   . Cancer Sister     Thyroid-Back  . Cancer Sister     Breast and Lung  . Heart disease Sister   . Heart disease Sister     ALLERGIES:  is allergic to latex; codeine; contrast media; and adhesive.  MEDICATIONS:  Current Outpatient Prescriptions  Medication Sig Dispense Refill  . acetaminophen (TYLENOL) 325 MG tablet Take 650 mg by mouth every 6 (six) hours as needed for moderate pain or headache.    . levothyroxine (SYNTHROID, LEVOTHROID) 125 MCG tablet Take 125 mcg by mouth every other day. To alternate with 144mcg dose    . levothyroxine (SYNTHROID, LEVOTHROID) 150 MCG tablet Take 150 mcg by mouth every other day. To alternate with 171mcg dose    . Multiple Vitamin (MULTIVITAMIN WITH MINERALS) TABS tablet Take 1 tablet by mouth daily. 30 tablet 0   No current facility-administered medications for this visit.      Marland Kitchen  PHYSICAL EXAMINATION:   Filed Vitals:   04/09/16 1134  BP: 119/75  Pulse: 88  Temp: 98.7 F (37.1 C)   Filed Weights   04/09/16 1134  Weight: 189 lb 9.5 oz (86 kg)    GENERAL: Well-nourished well-developed; Alert, no distress and comfortable.   Walking with a walker. She is accompanied by her daughter-in-law. She is obese. EYES:Positive for pallor. No icterus.  OROPHARYNX: no thrush or ulceration. NECK: supple, no masses felt LYMPH:  no palpable  lymphadenopathy in the cervical, axillary or inguinal regions LUNGS: clear to auscultation and  No wheeze or crackles HEART/CVS: regular rate & rhythm and no murmurs; No lower extremity edema ABDOMEN: abdomen soft, non-tender and normal bowel sounds Musculoskeletal:no cyanosis of digits and no clubbing  PSYCH: alert & oriented x 3 with fluent speech NEURO: no focal motor/sensory deficits  SKIN:  no rashes or significant lesions  LABORATORY DATA:  I have reviewed the data as listed Lab Results  Component Value Date   WBC 4.1 04/05/2016   HGB 7.7* 04/05/2016   HCT 22.4* 04/05/2016   MCV 125.8* 04/05/2016   PLT 154 04/05/2016    Recent Labs  04/03/16 2028 04/04/16 0454  NA 140 143  K 3.4* 3.9  CL 109 113*  CO2 25 26  GLUCOSE 106* 100*  BUN 18 12  CREATININE 0.73 0.69  CALCIUM 8.8* 8.0*  GFRNONAA >60 >60  GFRAA >60 >60  PROT 6.8  --   ALBUMIN 4.3  --   AST 24  --   ALT 15  --   ALKPHOS 51  --   BILITOT 1.8*  --     RADIOGRAPHIC STUDIES: I have personally reviewed the radiological images as listed and agreed with the findings in the report. Ct Abdomen Pelvis Wo Contrast  04/03/2016  CLINICAL DATA:  Right-sided abdominal pain with vomiting and diarrhea for 2 months. Oral contrast only due to IV contrast allergy. EXAM: CT ABDOMEN AND PELVIS WITHOUT CONTRAST TECHNIQUE: Multidetector CT imaging of the abdomen and pelvis was performed following the standard protocol without IV contrast. COMPARISON:  None. FINDINGS: Mild dependent atelectasis in the lung bases. There is a 7 mm noncalcified nodule with the left lung base anteriorly. Non-contrast chest CT at 6-12 months is recommended. If the nodule is stable at time of repeat CT, then future CT at 18-24 months (from today's scan) is considered optional for low-risk patients, but is recommended for high-risk patients. This recommendation follows the consensus statement: Guidelines for Management of Incidental Pulmonary Nodules Detected  on CT Images:From the Fleischner Society 2017; published online before print (10.1148/radiol.IJ:2314499). Residual contrast material in the lower esophagus may represent reflux or dysmotility. Surgical absence of the gallbladder. No bile duct dilatation. The unenhanced appearance of the liver, spleen, pancreas, adrenal glands, kidneys, abdominal aorta, inferior vena cava, and retroperitoneal lymph nodes is unremarkable. Scattered mesenteric axis and celiac axis lymph nodes are not pathologically enlarged. Stomach, small bowel, and colon are not abnormally distended. Contrast material flows through to the colon without evidence of bowel obstruction. No free air or free fluid in the abdomen. Pelvis: Appendix is normal. Bladder wall is mildly thickened. There is a small amount of gas in the bladder. In the absence of instrumentation, this is likely due to cystitis. Uterus is surgically absent. No abnormal adnexal masses. No pelvic mass or lymphadenopathy. No free or loculated pelvic fluid collections. Degenerative changes in the spine. No destructive bone lesions. IMPRESSION: 7 mm noncalcified nodule left lung base anteriorly. See above followup recommendations. Residual contrast material in the lower esophagus may represent reflux or dysmotility. Bladder wall thickening and small amount of gas in the bladder suggest cystitis. Electronically Signed   By: Lucienne Capers M.D.   On: 04/03/2016 23:40   Dg Chest 2 View  04/03/2016  CLINICAL DATA:  Nausea and vomiting for 2 months EXAM: CHEST  2 VIEW COMPARISON:  None. FINDINGS: The heart size and mediastinal contours are within normal limits. Both lungs are clear. The visualized skeletal structures are unremarkable. IMPRESSION: No active cardiopulmonary disease. Electronically Signed   By: Inez Catalina M.D.   On: 04/03/2016 20:57    ASSESSMENT & PLAN:   # Macrocytic anemia hemoglobin 7.7; MCV 120s- B12 50. We will repeat CBC and LFTs and LDH; check methylmalonic  acid. Intrinsic factor antibody/antiplatelet antibody.  # This is  very suggestive of B12 deficiency with macrocytic anemia. Recommend starting B12 as soon as possible. We'll plan to start B12 injection today. B12 thousand milligrams once a day for 7 days; once a week for 4 weeks; and then once a month. Patient's daughter-in-law-we will be trained to give injections at home. Also start the patient on potassium 20 mg once a day.  # Peripheral neuropathy- likely from B12 deficiency. Monitor for now.  # Left lower lung nodule 7 mm- follow-up scan in 6-12 months.  # Elevated bilirubin- check hepatitis surface antigen/hepatitis C antibody.  # Hypothyroidism-  recent TSH in the hospital was 6.5. This is being monitored/rechecked by PCP.   # patient will follow-up with me with CBC and LFTs BMP in about 6 weeks. We'll repeat CBC BMP in 1 week.  # 45 minutes face-to-face with the patient discussing the above plan of care; more than 50% of time spent on prognosis/ natural history; counseling and coordination.     Cammie Sickle, MD 04/09/2016 12:06 PM

## 2016-04-10 DIAGNOSIS — D649 Anemia, unspecified: Secondary | ICD-10-CM | POA: Diagnosis not present

## 2016-04-10 DIAGNOSIS — G8929 Other chronic pain: Secondary | ICD-10-CM | POA: Diagnosis not present

## 2016-04-10 DIAGNOSIS — R2689 Other abnormalities of gait and mobility: Secondary | ICD-10-CM | POA: Diagnosis not present

## 2016-04-10 DIAGNOSIS — I480 Paroxysmal atrial fibrillation: Secondary | ICD-10-CM | POA: Diagnosis not present

## 2016-04-10 DIAGNOSIS — E039 Hypothyroidism, unspecified: Secondary | ICD-10-CM | POA: Diagnosis not present

## 2016-04-10 DIAGNOSIS — R197 Diarrhea, unspecified: Secondary | ICD-10-CM | POA: Diagnosis not present

## 2016-04-10 DIAGNOSIS — I4891 Unspecified atrial fibrillation: Secondary | ICD-10-CM | POA: Diagnosis not present

## 2016-04-10 DIAGNOSIS — K922 Gastrointestinal hemorrhage, unspecified: Secondary | ICD-10-CM | POA: Diagnosis not present

## 2016-04-10 LAB — HEPATITIS B SURFACE ANTIGEN: HEP B S AG: NEGATIVE

## 2016-04-11 DIAGNOSIS — K920 Hematemesis: Secondary | ICD-10-CM | POA: Diagnosis not present

## 2016-04-11 DIAGNOSIS — I480 Paroxysmal atrial fibrillation: Secondary | ICD-10-CM | POA: Diagnosis not present

## 2016-04-11 DIAGNOSIS — G609 Hereditary and idiopathic neuropathy, unspecified: Secondary | ICD-10-CM | POA: Diagnosis not present

## 2016-04-11 DIAGNOSIS — E785 Hyperlipidemia, unspecified: Secondary | ICD-10-CM | POA: Diagnosis not present

## 2016-04-11 DIAGNOSIS — E039 Hypothyroidism, unspecified: Secondary | ICD-10-CM | POA: Diagnosis not present

## 2016-04-11 LAB — INTRINSIC FACTOR ANTIBODIES: INTRINSIC FACTOR: 43.5 [AU]/ml — AB (ref 0.0–1.1)

## 2016-04-11 LAB — ANTI-PARIETAL ANTIBODY: PARIETAL CELL ANTIBODY-IGG: 49.8 U — AB (ref 0.0–20.0)

## 2016-04-11 LAB — HEPATITIS C ANTIBODY: HCV Ab: <0.1 s/co ratio (ref 0.0–0.9)

## 2016-04-11 LAB — METHYLMALONIC ACID, SERUM: METHYLMALONIC ACID, QUANTITATIVE: 8641 nmol/L — AB (ref 0–378)

## 2016-04-14 DIAGNOSIS — B353 Tinea pedis: Secondary | ICD-10-CM | POA: Diagnosis not present

## 2016-04-14 DIAGNOSIS — J019 Acute sinusitis, unspecified: Secondary | ICD-10-CM | POA: Diagnosis not present

## 2016-04-15 ENCOUNTER — Other Ambulatory Visit: Payer: Self-pay

## 2016-04-15 DIAGNOSIS — R2689 Other abnormalities of gait and mobility: Secondary | ICD-10-CM | POA: Diagnosis not present

## 2016-04-15 DIAGNOSIS — G8929 Other chronic pain: Secondary | ICD-10-CM | POA: Diagnosis not present

## 2016-04-15 DIAGNOSIS — I480 Paroxysmal atrial fibrillation: Secondary | ICD-10-CM | POA: Diagnosis not present

## 2016-04-15 DIAGNOSIS — K922 Gastrointestinal hemorrhage, unspecified: Secondary | ICD-10-CM | POA: Diagnosis not present

## 2016-04-15 DIAGNOSIS — D649 Anemia, unspecified: Secondary | ICD-10-CM | POA: Diagnosis not present

## 2016-04-15 DIAGNOSIS — E039 Hypothyroidism, unspecified: Secondary | ICD-10-CM | POA: Diagnosis not present

## 2016-04-16 DIAGNOSIS — D649 Anemia, unspecified: Secondary | ICD-10-CM | POA: Diagnosis not present

## 2016-04-16 DIAGNOSIS — K922 Gastrointestinal hemorrhage, unspecified: Secondary | ICD-10-CM | POA: Diagnosis not present

## 2016-04-16 DIAGNOSIS — G8929 Other chronic pain: Secondary | ICD-10-CM | POA: Diagnosis not present

## 2016-04-16 DIAGNOSIS — I480 Paroxysmal atrial fibrillation: Secondary | ICD-10-CM | POA: Diagnosis not present

## 2016-04-16 DIAGNOSIS — R2689 Other abnormalities of gait and mobility: Secondary | ICD-10-CM | POA: Diagnosis not present

## 2016-04-16 DIAGNOSIS — E039 Hypothyroidism, unspecified: Secondary | ICD-10-CM | POA: Diagnosis not present

## 2016-04-16 NOTE — Discharge Instructions (Signed)

## 2016-04-17 ENCOUNTER — Encounter: Payer: Self-pay | Admitting: *Deleted

## 2016-04-17 ENCOUNTER — Encounter
Admission: RE | Disposition: A | Discharge status: Home / Self Care | Payer: Self-pay | Source: Ambulatory Visit | Attending: Gastroenterology

## 2016-04-17 ENCOUNTER — Ambulatory Visit: Payer: Medicare Other | Admitting: Student in an Organized Health Care Education/Training Program

## 2016-04-17 ENCOUNTER — Ambulatory Visit
Admission: RE | Admit: 2016-04-17 | Discharge: 2016-04-17 | Disposition: A | Discharge status: Home / Self Care | Payer: Medicare Other | Source: Ambulatory Visit | Attending: Gastroenterology | Admitting: Gastroenterology

## 2016-04-17 DIAGNOSIS — Z9889 Other specified postprocedural states: Secondary | ICD-10-CM | POA: Insufficient documentation

## 2016-04-17 DIAGNOSIS — G8929 Other chronic pain: Secondary | ICD-10-CM | POA: Diagnosis not present

## 2016-04-17 DIAGNOSIS — M1990 Unspecified osteoarthritis, unspecified site: Secondary | ICD-10-CM | POA: Diagnosis not present

## 2016-04-17 DIAGNOSIS — Z79899 Other long term (current) drug therapy: Secondary | ICD-10-CM | POA: Insufficient documentation

## 2016-04-17 DIAGNOSIS — R195 Other fecal abnormalities: Secondary | ICD-10-CM | POA: Insufficient documentation

## 2016-04-17 DIAGNOSIS — D509 Iron deficiency anemia, unspecified: Secondary | ICD-10-CM

## 2016-04-17 DIAGNOSIS — D649 Anemia, unspecified: Secondary | ICD-10-CM | POA: Diagnosis not present

## 2016-04-17 DIAGNOSIS — K219 Gastro-esophageal reflux disease without esophagitis: Secondary | ICD-10-CM | POA: Diagnosis not present

## 2016-04-17 DIAGNOSIS — Z91040 Latex allergy status: Secondary | ICD-10-CM | POA: Diagnosis not present

## 2016-04-17 DIAGNOSIS — K449 Diaphragmatic hernia without obstruction or gangrene: Secondary | ICD-10-CM | POA: Diagnosis not present

## 2016-04-17 DIAGNOSIS — K5730 Diverticulosis of large intestine without perforation or abscess without bleeding: Secondary | ICD-10-CM | POA: Diagnosis not present

## 2016-04-17 DIAGNOSIS — I872 Venous insufficiency (chronic) (peripheral): Secondary | ICD-10-CM | POA: Insufficient documentation

## 2016-04-17 DIAGNOSIS — M545 Low back pain: Secondary | ICD-10-CM | POA: Insufficient documentation

## 2016-04-17 DIAGNOSIS — E039 Hypothyroidism, unspecified: Secondary | ICD-10-CM | POA: Diagnosis not present

## 2016-04-17 DIAGNOSIS — Z90710 Acquired absence of both cervix and uterus: Secondary | ICD-10-CM | POA: Insufficient documentation

## 2016-04-17 DIAGNOSIS — Z91041 Radiographic dye allergy status: Secondary | ICD-10-CM | POA: Insufficient documentation

## 2016-04-17 DIAGNOSIS — I739 Peripheral vascular disease, unspecified: Secondary | ICD-10-CM | POA: Diagnosis not present

## 2016-04-17 DIAGNOSIS — K640 First degree hemorrhoids: Secondary | ICD-10-CM | POA: Insufficient documentation

## 2016-04-17 DIAGNOSIS — Z87891 Personal history of nicotine dependence: Secondary | ICD-10-CM | POA: Diagnosis not present

## 2016-04-17 DIAGNOSIS — Z885 Allergy status to narcotic agent status: Secondary | ICD-10-CM | POA: Insufficient documentation

## 2016-04-17 DIAGNOSIS — Z91048 Other nonmedicinal substance allergy status: Secondary | ICD-10-CM | POA: Diagnosis not present

## 2016-04-17 DIAGNOSIS — D124 Benign neoplasm of descending colon: Secondary | ICD-10-CM | POA: Insufficient documentation

## 2016-04-17 DIAGNOSIS — K635 Polyp of colon: Secondary | ICD-10-CM | POA: Diagnosis not present

## 2016-04-17 HISTORY — DX: Peripheral vascular disease, unspecified: I73.9

## 2016-04-17 HISTORY — PX: COLONOSCOPY WITH PROPOFOL: SHX5780

## 2016-04-17 HISTORY — DX: Anemia, unspecified: D64.9

## 2016-04-17 HISTORY — DX: Venous insufficiency (chronic) (peripheral): I87.2

## 2016-04-17 HISTORY — DX: Cardiac arrhythmia, unspecified: I49.9

## 2016-04-17 HISTORY — PX: POLYPECTOMY: SHX5525

## 2016-04-17 HISTORY — DX: Motion sickness, initial encounter: T75.3XXA

## 2016-04-17 SURGERY — COLONOSCOPY WITH PROPOFOL
Anesthesia: Monitor Anesthesia Care | Wound class: Contaminated

## 2016-04-17 MED ORDER — PROPOFOL 10 MG/ML IV BOLUS: INTRAVENOUS | Status: DC | PRN

## 2016-04-17 MED ORDER — ACETAMINOPHEN 160 MG/5ML PO SOLN: 325.0000 mg | ORAL | Status: DC | PRN

## 2016-04-17 MED ORDER — LACTATED RINGERS IV SOLN
INTRAVENOUS | Status: DC
  Administered 2016-04-17: 08:00:00 via INTRAVENOUS

## 2016-04-17 MED ORDER — STERILE WATER FOR IRRIGATION IR SOLN
Status: DC | PRN
  Administered 2016-04-17: 08:00:00

## 2016-04-17 MED ORDER — ONDANSETRON HCL 4 MG/2ML IJ SOLN: 4.0000 mg | Freq: Once | INTRAMUSCULAR | Status: DC | PRN

## 2016-04-17 MED ORDER — PROPOFOL 10 MG/ML IV BOLUS
INTRAVENOUS | Status: DC | PRN
  Administered 2016-04-17 (×7): 20 mg via INTRAVENOUS

## 2016-04-17 MED ORDER — ACETAMINOPHEN 325 MG PO TABS: 325.0000 mg | ORAL_TABLET | ORAL | Status: DC | PRN

## 2016-04-17 SURGICAL SUPPLY — 22 items
CANISTER SUCT 1200ML W/VALVE (MISCELLANEOUS) ×3 IMPLANT
CLIP HMST 235XBRD CATH ROT (MISCELLANEOUS) IMPLANT
CLIP RESOLUTION 360 11X235 (MISCELLANEOUS)
FCP ESCP3.2XJMB 240X2.8X (MISCELLANEOUS)
FORCEPS BIOP RAD 4 LRG CAP 4 (CUTTING FORCEPS) IMPLANT
FORCEPS BIOP RJ4 240 W/NDL (MISCELLANEOUS)
FORCEPS ESCP3.2XJMB 240X2.8X (MISCELLANEOUS) IMPLANT
GOWN CVR UNV OPN BCK APRN NK (MISCELLANEOUS) ×4 IMPLANT
GOWN ISOL THUMB LOOP REG UNIV (MISCELLANEOUS) ×2
INJECTOR VARIJECT VIN23 (MISCELLANEOUS) IMPLANT
KIT DEFENDO VALVE AND CONN (KITS) IMPLANT
KIT ENDO PROCEDURE OLY (KITS) ×3 IMPLANT
MARKER SPOT ENDO TATTOO 5ML (MISCELLANEOUS) IMPLANT
PAD GROUND ADULT SPLIT (MISCELLANEOUS) IMPLANT
PROBE APC STR FIRE (PROBE) IMPLANT
SNARE SHORT THROW 13M SML OVAL (MISCELLANEOUS) ×3 IMPLANT
SNARE SHORT THROW 30M LRG OVAL (MISCELLANEOUS) IMPLANT
SNARE SNG USE RND 15MM (INSTRUMENTS) IMPLANT
SPOT EX ENDOSCOPIC TATTOO (MISCELLANEOUS)
TRAP ETRAP POLY (MISCELLANEOUS) ×3 IMPLANT
VARIJECT INJECTOR VIN23 (MISCELLANEOUS)
WATER STERILE IRR 250ML POUR (IV SOLUTION) ×3 IMPLANT

## 2016-04-17 NOTE — Anesthesia Preprocedure Evaluation (Signed)
Anesthesia Evaluation  Patient identified by MRN, date of birth, ID band Patient awake    Reviewed: Allergy & Precautions, NPO status , Patient's Chart, lab work & pertinent test results  Airway Mallampati: II  TM Distance: >3 FB Neck ROM: Full    Dental no notable dental hx.    Pulmonary shortness of breath, former smoker,    Pulmonary exam normal        Cardiovascular + Peripheral Vascular Disease  Normal cardiovascular exam     Neuro/Psych PSYCHIATRIC DISORDERS (chronic pain) Peripheral neuropathy    GI/Hepatic Neg liver ROS, hiatal hernia, GERD  ,  Endo/Other  Hypothyroidism   Renal/GU negative Renal ROS     Musculoskeletal  (+) Arthritis , Osteoarthritis,    Abdominal   Peds  Hematology   Anesthesia Other Findings   Reproductive/Obstetrics                             Anesthesia Physical Anesthesia Plan  ASA: II  Anesthesia Plan: MAC   Post-op Pain Management:    Induction: Intravenous  Airway Management Planned:   Additional Equipment:   Intra-op Plan:   Post-operative Plan:   Informed Consent: I have reviewed the patients History and Physical, chart, labs and discussed the procedure including the risks, benefits and alternatives for the proposed anesthesia with the patient or authorized representative who has indicated his/her understanding and acceptance.     Plan Discussed with: CRNA  Anesthesia Plan Comments:         Anesthesia Quick Evaluation

## 2016-04-17 NOTE — H&P (Signed)
[facility]  [address], [place], [place] [address] Phone: [phone] Fax : [phone]  Primary Care Physician:  Lillard Anes, MD Primary Gastroenterologist:  Dr. Allen Norris  Pre-Procedure History & Physical: HPI:  Sydney Ayala is a 70 y.o. female is here for an colonoscopy.   Past Medical History  Diagnosis Date  . Hypothyroidism   . Ganglion cyst LEFT RING FINGER  . Chronic low back pain LUMBAR    RELEIVE W/ TENS UNIT PRN AND CHRIOPACTOR  . Left hand weakness   . Numbness and tingling BILATERAL ARMS SECONDARY TO CERVICAL PINCHED NERVE  . Pinched nerve in neck   . History of rheumatic fever CHILDHOOD  . H/O tenderness in limb BILATEARL LEGS AND HEELS DUE TO TENDINITIS AND SPURS  . Mild acid reflux   . H/O hiatal hernia   . Urge urinary incontinence   . Snores   . Varicose veins     Left Leg  . Short of breath on exertion   . Anemia   . Dysrhythmia     A Fib during hospital stay 04/03/16  . Heart murmur ASYMPTOMATIC    s/p rheumatic fever  . Peripheral vascular disease (HCC)     legs.   . Chronic venous insufficiency   . Arthritis      Back, shoulders, arms  . Motion sickness     cars    Past Surgical History  Procedure Laterality Date  . Left breast ductal excision  10-11-2009    BENIGN  . Rotator cuff repair  04-11-2008    LEFT SHOULDER  . Vaginal hysterectomy/ anterior & posterior repair/ transobturator sling  04-25-2005    CYSTOCELE/ RECTOCELE/ UTERINE PROLAPSE/ SUI  . Right breast lumpectomy      BENIGN  . Thyroid goiter removed  1970'S  . Dilation and curettage of uterus    . Hemrroidectomy and anal fissure repair  1965  . Left hand surg.  1980'S    EXTENSIVE REPAIR AND REMOVAL OF LIGAMENT/TENDON  (?CANCER)  . Removal right goin mass  1980'S    BENIGN  . Right foot surg  X3    REPAIR OF 2 TOES  . Lumbar laminectomy  1990  &  1976    RIGHT SIDE L4 - 5  & L5 - S1  . Tonsillectomy  CHILD  . Removal toe nailbed  AGE  70    ALL 10 TOES-- DEFORMED  . Cardiovascular stress test  APRIL 2009    NORMAL LVSF/ EF 61%/ NO ISCHEMIA  . Ganglion cyst excision  06/09/2012    Procedure: REMOVAL GANGLION OF WRIST;  Surgeon: Magnus Sinning, MD;  Location: [facility];  Service: Orthopedics;  Laterality: Left;  EXCISION OF GANGLION CYST LEFT RING FINGER  IV REGIONAL ANESTHESIA  . Abdominal hysterectomy    . Laparoscopic cholecystectomy  05-13-2002    Gall Bladder  . Spine surgery    . Esophagogastroduodenoscopy (egd) with propofol N/A 04/04/2016    Procedure: ESOPHAGOGASTRODUODENOSCOPY (EGD) WITH PROPOFOL;  Surgeon: Lucilla Lame, MD;  Location: [facility];  Service: Endoscopy;  Laterality: N/A;    Prior to Admission medications   Medication Sig Start Date End Date Taking? Authorizing Provider  acetaminophen (TYLENOL) 325 MG tablet Take 650 mg by mouth every 6 (six) hours as needed for moderate pain or headache.   Yes Historical Provider, MD  levothyroxine (SYNTHROID, LEVOTHROID) 125 MCG tablet Take 125 mcg by mouth every other day. To alternate with 170mcg dose  Yes Historical Provider, MD  levothyroxine (SYNTHROID, LEVOTHROID) 150 MCG tablet Take 150 mcg by mouth every other day. To alternate with 180mcg dose   Yes Historical Provider, MD  Multiple Vitamin (MULTIVITAMIN WITH MINERALS) TABS tablet Take 1 tablet by mouth daily. 04/05/16  Yes Fritzi Mandes, MD  cyanocobalamin (,VITAMIN B-12,) 1000 MCG/ML injection Intramuscular ; 1 mL once a day for 7 days; then once every week for 4 weeks; and then once every month. 04/09/16   Cammie Sickle, MD  diltiazem (CARDIZEM CD) 120 MG 24 hr capsule Take by mouth. 04/10/16 04/10/17  Historical Provider, MD  potassium chloride SA (K-DUR,KLOR-CON) 20 MEQ tablet Take 1 tablet (20 mEq total) by mouth once. 04/09/16   Cammie Sickle, MD  Syringe/Needle, Disp, (SYRINGE 3CC/22GX1") 22G X 1" 3 ML MISC 1 Syringe by Does not apply route once. Please dispense  syringe/needle for b12 injections. Pt will self administer her own injections. 04/09/16   Cammie Sickle, MD    Allergies as of 04/08/2016 - Review Complete 04/08/2016  Allergen Reaction Noted  . Latex Rash 04/30/2015  . Codeine Nausea And Vomiting 06/03/2012  . Contrast media [iodinated diagnostic agents] Nausea And Vomiting and Other (See Comments) 06/03/2012  . Adhesive [tape] Rash and Other (See Comments) 06/02/2012    Family History  Problem Relation Age of Onset  . Deep vein thrombosis Mother     Right Leg  . Heart disease Mother     Before age 34-  CHF  . Varicose Veins Mother   . Heart attack Mother   . Heart disease Father     Before age 55-  PVD  . Heart attack Father   . Arthritis Father     Gout  . Hypertension Father   . Cancer Sister     Thyroid-Back  . Cancer Sister     Breast and Lung  . Heart disease Sister   . Heart disease Sister     Social History   Social History  . Marital Status: Divorced    Spouse Name: N/A  . Number of Children: N/A  . Years of Education: N/A   Occupational History  . Not on file.   Social History Main Topics  . Smoking status: Former Smoker -- 40 years    Types: Cigarettes    Quit date: 06/04/1991  . Smokeless tobacco: Never Used  . Alcohol Use: No  . Drug Use: No  . Sexual Activity: Not on file   Other Topics Concern  . Not on file   Social History Narrative    Review of Systems: See HPI, otherwise negative ROS  Physical Exam: Ht 5\' 5"  (1.651 m)  Wt 192 lb (87.091 kg)  BMI 31.95 kg/m2 General:   Alert,  pleasant and cooperative in NAD Head:  Normocephalic and atraumatic. Neck:  Supple; no masses or thyromegaly. Lungs:  Clear throughout to auscultation.    Heart:  Regular rate and rhythm. Abdomen:  Soft, nontender and nondistended. Normal bowel sounds, without guarding, and without rebound.   Neurologic:  Alert and  oriented x4;  grossly normal neurologically.  Impression/Plan: Sydney Ayala is  here for an colonoscopy to be performed for anemia and heme positive stools  Risks, benefits, limitations, and alternatives regarding  colonoscopy have been reviewed with the patient.  Questions have been answered.  All parties agreeable.   Lucilla Lame, MD  04/17/2016, 7:32 AM

## 2016-04-17 NOTE — Anesthesia Procedure Notes (Signed)
Procedure Name: MAC Performed by: Aseem Sessums Pre-anesthesia Checklist: Patient identified, Emergency Drugs available, Suction available, Timeout performed and Patient being monitored Patient Re-evaluated:Patient Re-evaluated prior to inductionOxygen Delivery Method: Nasal cannula Placement Confirmation: positive ETCO2     

## 2016-04-17 NOTE — Op Note (Signed)
[facility]astroenter[facility] Patient Name: Sydney Ayala Procedure Date: 04/17/2016 8:09 AM MRN: [id number] Account #: [id number] Date of Birth: 1946/08/01 Admit Type: Outpatient Age: 70 Room: [facility] OR ROOM 01 Gender: Female Note Status: Finalized Procedure:            Colonoscopy Indications:          Heme positive stool, Unexplained iron deficiency anemia Providers:            Lucilla Lame, MD Referring MD:         Morene Crocker (Referring MD) Medicines:            Propofol per Anesthesia Complications:        No immediate complications. Procedure:            Pre-Anesthesia Assessment:                       - Prior to the procedure, a History and Physical was                        performed, and patient medications and allergies were                        reviewed. The patient's tolerance of previous                        anesthesia was also reviewed. The risks and benefits of                        the procedure and the sedation options and risks were                        discussed with the patient. All questions were                        answered, and informed consent was obtained. Prior                        Anticoagulants: The patient has taken no previous                        anticoagulant or antiplatelet agents. ASA Grade                        Assessment: II - A patient with mild systemic disease.                        After reviewing the risks and benefits, the patient was                        deemed in satisfactory condition to undergo the                        procedure.                       After obtaining informed consent, the colonoscope was                        passed under direct vision. Throughout the procedure,  the patient's blood pressure, pulse, and oxygen                        saturations were monitored continuously. The Olympus                        CF-HQ190L Colonoscope (S#. [id number]) was introduced                         through the anus and advanced to the the cecum,                        identified by appendiceal orifice and ileocecal valve.                        The colonoscopy was performed without difficulty. The                        patient tolerated the procedure well. The quality of                        the bowel preparation was excellent. Findings:      The perianal and digital rectal examinations were normal.      A 6 mm polyp was found in the descending colon. The polyp was sessile.       The polyp was removed with a cold snare. Resection and retrieval were       complete.      Multiple small-mouthed diverticula were found in the sigmoid colon and       descending colon.      Non-bleeding internal hemorrhoids were found during retroflexion. The       hemorrhoids were Grade I (internal hemorrhoids that do not prolapse). Impression:           - One 6 mm polyp in the descending colon, removed with                        a cold snare. Resected and retrieved.                       - Diverticulosis in the sigmoid colon and in the                        descending colon.                       - Non-bleeding internal hemorrhoids. Recommendation:       - Await pathology results.                       - Repeat colonoscopy in 5 years if polyp adenoma and 10                        years if hyperplastic Procedure Code(s):    --- Professional ---                       [id number], Colonoscopy, flexible; with removal of tumor(s),                        polyp(s), or other lesion(s) by snare technique Diagnosis Code(s):    ---  Professional ---                       R19.5, Other fecal abnormalities                       D50.9, Iron deficiency anemia, unspecified                       D12.4, Benign neoplasm of descending colon CPT copyright 2016 American Medical Association. All rights reserved. The codes documented in this report are preliminary and upon coder review may  be revised to  meet current compliance requirements. Lucilla Lame, MD 04/17/2016 8:32:35 AM This report has been signed electronically. Number of Addenda: 0 Note Initiated On: 04/17/2016 8:09 AM Scope Withdrawal Time: 0 hours 6 minutes 26 seconds  Total Procedure Duration: 0 hours 12 minutes 29 seconds       [facility]

## 2016-04-17 NOTE — Anesthesia Postprocedure Evaluation (Signed)
Anesthesia Post Note  Patient: Sydney Ayala  Procedure(s) Performed: Procedure(s) (LRB): COLONOSCOPY WITH PROPOFOL (N/A) POLYPECTOMY  Patient location during evaluation: PACU Anesthesia Type: MAC Level of consciousness: awake and alert and oriented Pain management: pain level controlled Vital Signs Assessment: post-procedure vital signs reviewed and stable Respiratory status: spontaneous breathing and nonlabored ventilation Cardiovascular status: stable Postop Assessment: no signs of nausea or vomiting and adequate PO intake Anesthetic complications: no    Estill Batten

## 2016-04-17 NOTE — Transfer of Care (Signed)
Immediate Anesthesia Transfer of Care Note  Patient: Sydney Ayala  Procedure(s) Performed: Procedure(s): COLONOSCOPY WITH PROPOFOL (N/A) POLYPECTOMY  Patient Location: PACU  Anesthesia Type: MAC  Level of Consciousness: awake, alert  and patient cooperative  Airway and Oxygen Therapy: Patient Spontanous Breathing and Patient connected to supplemental oxygen  Post-op Assessment: Post-op Vital signs reviewed, Patient's Cardiovascular Status Stable, Respiratory Function Stable, Patent Airway and No signs of Nausea or vomiting  Post-op Vital Signs: Reviewed and stable  Complications: No apparent anesthesia complications

## 2016-04-18 ENCOUNTER — Encounter: Payer: Self-pay | Admitting: Gastroenterology

## 2016-04-18 DIAGNOSIS — D649 Anemia, unspecified: Secondary | ICD-10-CM | POA: Diagnosis not present

## 2016-04-18 DIAGNOSIS — I480 Paroxysmal atrial fibrillation: Secondary | ICD-10-CM | POA: Diagnosis not present

## 2016-04-18 DIAGNOSIS — E039 Hypothyroidism, unspecified: Secondary | ICD-10-CM | POA: Diagnosis not present

## 2016-04-18 DIAGNOSIS — G8929 Other chronic pain: Secondary | ICD-10-CM | POA: Diagnosis not present

## 2016-04-18 DIAGNOSIS — K922 Gastrointestinal hemorrhage, unspecified: Secondary | ICD-10-CM | POA: Diagnosis not present

## 2016-04-18 DIAGNOSIS — R2689 Other abnormalities of gait and mobility: Secondary | ICD-10-CM | POA: Diagnosis not present

## 2016-04-22 ENCOUNTER — Encounter: Payer: Self-pay | Admitting: Gastroenterology

## 2016-04-23 ENCOUNTER — Ambulatory Visit: Payer: Medicare Other | Admitting: Gastroenterology

## 2016-04-23 DIAGNOSIS — I480 Paroxysmal atrial fibrillation: Secondary | ICD-10-CM | POA: Diagnosis not present

## 2016-04-23 DIAGNOSIS — D649 Anemia, unspecified: Secondary | ICD-10-CM | POA: Diagnosis not present

## 2016-04-23 DIAGNOSIS — E039 Hypothyroidism, unspecified: Secondary | ICD-10-CM | POA: Diagnosis not present

## 2016-04-23 DIAGNOSIS — K922 Gastrointestinal hemorrhage, unspecified: Secondary | ICD-10-CM | POA: Diagnosis not present

## 2016-04-23 DIAGNOSIS — G8929 Other chronic pain: Secondary | ICD-10-CM | POA: Diagnosis not present

## 2016-04-23 DIAGNOSIS — R2689 Other abnormalities of gait and mobility: Secondary | ICD-10-CM | POA: Diagnosis not present

## 2016-04-24 DIAGNOSIS — K922 Gastrointestinal hemorrhage, unspecified: Secondary | ICD-10-CM | POA: Diagnosis not present

## 2016-04-24 DIAGNOSIS — R2689 Other abnormalities of gait and mobility: Secondary | ICD-10-CM | POA: Diagnosis not present

## 2016-04-24 DIAGNOSIS — G8929 Other chronic pain: Secondary | ICD-10-CM | POA: Diagnosis not present

## 2016-04-24 DIAGNOSIS — E039 Hypothyroidism, unspecified: Secondary | ICD-10-CM | POA: Diagnosis not present

## 2016-04-24 DIAGNOSIS — I480 Paroxysmal atrial fibrillation: Secondary | ICD-10-CM | POA: Diagnosis not present

## 2016-04-24 DIAGNOSIS — D649 Anemia, unspecified: Secondary | ICD-10-CM | POA: Diagnosis not present

## 2016-04-29 DIAGNOSIS — R2689 Other abnormalities of gait and mobility: Secondary | ICD-10-CM | POA: Diagnosis not present

## 2016-04-29 DIAGNOSIS — G8929 Other chronic pain: Secondary | ICD-10-CM | POA: Diagnosis not present

## 2016-04-29 DIAGNOSIS — D649 Anemia, unspecified: Secondary | ICD-10-CM | POA: Diagnosis not present

## 2016-04-29 DIAGNOSIS — I480 Paroxysmal atrial fibrillation: Secondary | ICD-10-CM | POA: Diagnosis not present

## 2016-04-29 DIAGNOSIS — E039 Hypothyroidism, unspecified: Secondary | ICD-10-CM | POA: Diagnosis not present

## 2016-04-29 DIAGNOSIS — K922 Gastrointestinal hemorrhage, unspecified: Secondary | ICD-10-CM | POA: Diagnosis not present

## 2016-05-02 DIAGNOSIS — R2689 Other abnormalities of gait and mobility: Secondary | ICD-10-CM | POA: Diagnosis not present

## 2016-05-02 DIAGNOSIS — I480 Paroxysmal atrial fibrillation: Secondary | ICD-10-CM | POA: Diagnosis not present

## 2016-05-02 DIAGNOSIS — E039 Hypothyroidism, unspecified: Secondary | ICD-10-CM | POA: Diagnosis not present

## 2016-05-02 DIAGNOSIS — D649 Anemia, unspecified: Secondary | ICD-10-CM | POA: Diagnosis not present

## 2016-05-02 DIAGNOSIS — K922 Gastrointestinal hemorrhage, unspecified: Secondary | ICD-10-CM | POA: Diagnosis not present

## 2016-05-02 DIAGNOSIS — G8929 Other chronic pain: Secondary | ICD-10-CM | POA: Diagnosis not present

## 2016-05-06 DIAGNOSIS — G8929 Other chronic pain: Secondary | ICD-10-CM | POA: Diagnosis not present

## 2016-05-06 DIAGNOSIS — E039 Hypothyroidism, unspecified: Secondary | ICD-10-CM | POA: Diagnosis not present

## 2016-05-06 DIAGNOSIS — R2689 Other abnormalities of gait and mobility: Secondary | ICD-10-CM | POA: Diagnosis not present

## 2016-05-06 DIAGNOSIS — I480 Paroxysmal atrial fibrillation: Secondary | ICD-10-CM | POA: Diagnosis not present

## 2016-05-06 DIAGNOSIS — K922 Gastrointestinal hemorrhage, unspecified: Secondary | ICD-10-CM | POA: Diagnosis not present

## 2016-05-06 DIAGNOSIS — D649 Anemia, unspecified: Secondary | ICD-10-CM | POA: Diagnosis not present

## 2016-05-09 DIAGNOSIS — I10 Essential (primary) hypertension: Secondary | ICD-10-CM | POA: Diagnosis not present

## 2016-05-09 DIAGNOSIS — G8929 Other chronic pain: Secondary | ICD-10-CM | POA: Diagnosis not present

## 2016-05-09 DIAGNOSIS — E039 Hypothyroidism, unspecified: Secondary | ICD-10-CM | POA: Diagnosis not present

## 2016-05-09 DIAGNOSIS — I480 Paroxysmal atrial fibrillation: Secondary | ICD-10-CM | POA: Diagnosis not present

## 2016-05-09 DIAGNOSIS — D649 Anemia, unspecified: Secondary | ICD-10-CM | POA: Diagnosis not present

## 2016-05-09 DIAGNOSIS — E785 Hyperlipidemia, unspecified: Secondary | ICD-10-CM | POA: Diagnosis not present

## 2016-05-09 DIAGNOSIS — Z23 Encounter for immunization: Secondary | ICD-10-CM | POA: Diagnosis not present

## 2016-05-09 DIAGNOSIS — K922 Gastrointestinal hemorrhage, unspecified: Secondary | ICD-10-CM | POA: Diagnosis not present

## 2016-05-09 DIAGNOSIS — R2689 Other abnormalities of gait and mobility: Secondary | ICD-10-CM | POA: Diagnosis not present

## 2016-05-12 DIAGNOSIS — G8929 Other chronic pain: Secondary | ICD-10-CM | POA: Diagnosis not present

## 2016-05-12 DIAGNOSIS — I480 Paroxysmal atrial fibrillation: Secondary | ICD-10-CM | POA: Diagnosis not present

## 2016-05-12 DIAGNOSIS — D649 Anemia, unspecified: Secondary | ICD-10-CM | POA: Diagnosis not present

## 2016-05-12 DIAGNOSIS — E039 Hypothyroidism, unspecified: Secondary | ICD-10-CM | POA: Diagnosis not present

## 2016-05-12 DIAGNOSIS — R2689 Other abnormalities of gait and mobility: Secondary | ICD-10-CM | POA: Diagnosis not present

## 2016-05-12 DIAGNOSIS — K922 Gastrointestinal hemorrhage, unspecified: Secondary | ICD-10-CM | POA: Diagnosis not present

## 2016-05-14 DIAGNOSIS — K922 Gastrointestinal hemorrhage, unspecified: Secondary | ICD-10-CM | POA: Diagnosis not present

## 2016-05-14 DIAGNOSIS — I480 Paroxysmal atrial fibrillation: Secondary | ICD-10-CM | POA: Diagnosis not present

## 2016-05-15 ENCOUNTER — Other Ambulatory Visit: Payer: Self-pay | Admitting: Legal Medicine

## 2016-05-15 ENCOUNTER — Other Ambulatory Visit: Payer: Self-pay

## 2016-05-15 DIAGNOSIS — Z1231 Encounter for screening mammogram for malignant neoplasm of breast: Secondary | ICD-10-CM

## 2016-05-20 ENCOUNTER — Other Ambulatory Visit: Payer: Self-pay | Admitting: *Deleted

## 2016-05-20 DIAGNOSIS — E785 Hyperlipidemia, unspecified: Secondary | ICD-10-CM | POA: Diagnosis not present

## 2016-05-20 DIAGNOSIS — D519 Vitamin B12 deficiency anemia, unspecified: Secondary | ICD-10-CM

## 2016-05-20 DIAGNOSIS — I10 Essential (primary) hypertension: Secondary | ICD-10-CM | POA: Diagnosis not present

## 2016-05-20 DIAGNOSIS — D509 Iron deficiency anemia, unspecified: Secondary | ICD-10-CM

## 2016-05-20 DIAGNOSIS — E039 Hypothyroidism, unspecified: Secondary | ICD-10-CM | POA: Diagnosis not present

## 2016-05-20 DIAGNOSIS — I480 Paroxysmal atrial fibrillation: Secondary | ICD-10-CM | POA: Diagnosis not present

## 2016-05-21 ENCOUNTER — Inpatient Hospital Stay: Payer: Medicare Other | Attending: Internal Medicine

## 2016-05-21 ENCOUNTER — Inpatient Hospital Stay (HOSPITAL_BASED_OUTPATIENT_CLINIC_OR_DEPARTMENT_OTHER): Payer: Medicare Other | Admitting: Internal Medicine

## 2016-05-21 VITALS — BP 143/68 | HR 73 | Temp 97.9°F | Ht 65.0 in | Wt 184.2 lb

## 2016-05-21 DIAGNOSIS — I4891 Unspecified atrial fibrillation: Secondary | ICD-10-CM | POA: Diagnosis not present

## 2016-05-21 DIAGNOSIS — M1990 Unspecified osteoarthritis, unspecified site: Secondary | ICD-10-CM | POA: Diagnosis not present

## 2016-05-21 DIAGNOSIS — Z79899 Other long term (current) drug therapy: Secondary | ICD-10-CM

## 2016-05-21 DIAGNOSIS — R911 Solitary pulmonary nodule: Secondary | ICD-10-CM | POA: Insufficient documentation

## 2016-05-21 DIAGNOSIS — E039 Hypothyroidism, unspecified: Secondary | ICD-10-CM | POA: Diagnosis not present

## 2016-05-21 DIAGNOSIS — D519 Vitamin B12 deficiency anemia, unspecified: Secondary | ICD-10-CM | POA: Diagnosis not present

## 2016-05-21 DIAGNOSIS — Z87891 Personal history of nicotine dependence: Secondary | ICD-10-CM | POA: Diagnosis not present

## 2016-05-21 DIAGNOSIS — R17 Unspecified jaundice: Secondary | ICD-10-CM

## 2016-05-21 DIAGNOSIS — I872 Venous insufficiency (chronic) (peripheral): Secondary | ICD-10-CM | POA: Diagnosis not present

## 2016-05-21 DIAGNOSIS — K449 Diaphragmatic hernia without obstruction or gangrene: Secondary | ICD-10-CM | POA: Diagnosis not present

## 2016-05-21 DIAGNOSIS — K219 Gastro-esophageal reflux disease without esophagitis: Secondary | ICD-10-CM | POA: Insufficient documentation

## 2016-05-21 DIAGNOSIS — I739 Peripheral vascular disease, unspecified: Secondary | ICD-10-CM | POA: Diagnosis not present

## 2016-05-21 DIAGNOSIS — D518 Other vitamin B12 deficiency anemias: Secondary | ICD-10-CM

## 2016-05-21 DIAGNOSIS — D509 Iron deficiency anemia, unspecified: Secondary | ICD-10-CM

## 2016-05-21 LAB — CBC WITH DIFFERENTIAL/PLATELET
BASOS ABS: 0 10*3/uL (ref 0–0.1)
BASOS PCT: 0 %
EOS ABS: 0.2 10*3/uL (ref 0–0.7)
EOS PCT: 3 %
HCT: 40.6 % (ref 35.0–47.0)
Hemoglobin: 13.8 g/dL (ref 12.0–16.0)
Lymphocytes Relative: 34 %
Lymphs Abs: 2 10*3/uL (ref 1.0–3.6)
MCH: 34 pg (ref 26.0–34.0)
MCHC: 34 g/dL (ref 32.0–36.0)
MCV: 100 fL (ref 80.0–100.0)
MONO ABS: 0.4 10*3/uL (ref 0.2–0.9)
MONOS PCT: 7 %
Neutro Abs: 3.2 10*3/uL (ref 1.4–6.5)
Neutrophils Relative %: 56 %
PLATELETS: 256 10*3/uL (ref 150–440)
RBC: 4.06 MIL/uL (ref 3.80–5.20)
RDW: 16.4 % — AB (ref 11.5–14.5)
WBC: 5.8 10*3/uL (ref 3.6–11.0)

## 2016-05-21 LAB — BASIC METABOLIC PANEL
Anion gap: 6 (ref 5–15)
BUN: 18 mg/dL (ref 6–20)
CALCIUM: 9.3 mg/dL (ref 8.9–10.3)
CHLORIDE: 106 mmol/L (ref 101–111)
CO2: 28 mmol/L (ref 22–32)
CREATININE: 0.99 mg/dL (ref 0.44–1.00)
GFR calc Af Amer: >60 mL/min (ref >60)
GFR, EST NON AFRICAN AMERICAN: 57 mL/min — AB (ref >60)
Glucose, Bld: 111 mg/dL — ABNORMAL HIGH (ref 65–99)
Potassium: 4.1 mmol/L (ref 3.5–5.1)
SODIUM: 140 mmol/L (ref 135–145)

## 2016-05-21 LAB — VITAMIN B12: VITAMIN B 12: 656 pg/mL (ref 180–914)

## 2016-05-21 LAB — LACTATE DEHYDROGENASE: LDH: 167 U/L (ref 98–192)

## 2016-05-21 MED ORDER — CYANOCOBALAMIN 1000 MCG/ML IJ SOLN: INTRAMUSCULAR | Status: DC

## 2016-05-21 NOTE — Progress Notes (Signed)
[name]  Patient Care Team: Lillard Anes, MD as PCP - General (Family Medicine)  CHIEF COMPLAINTS/PURPOSE OF CONSULTATION:   #April 2017-B12 DEF MACROCYTIC ANEMIA [B12 =50; positive IF/anti-parietal AB] autoimmune related; B12 IM monthly.   # LLL lung nodule 98mm [april 2017]; prior hx of smoking  # A.fib with RVR- not on anti-coag; hypothyroidism [s/p surgery]; Peripheral neuropathy- ? B 12 def  HISTORY OF PRESENTING ILLNESS:  Sydney Ayala 70 y.o.  female history of B12 deficiency is here for follow-up. Patient is taking B12 injections on a monthly basis now.  Noted significant improvement of her energy levels. She continues to have chronic mild tingling and numbness of the extremities.  Diarrhea improved. No nausea no vomiting.   ROS: A complete 10 point review of system is done which is negative except mentioned above in history of present illness  MEDICAL HISTORY:  Past Medical History  Diagnosis Date  . Hypothyroidism   . Ganglion cyst LEFT RING FINGER  . Chronic low back pain LUMBAR    RELEIVE W/ TENS UNIT PRN AND CHRIOPACTOR  . Left hand weakness   . Numbness and tingling BILATERAL ARMS SECONDARY TO CERVICAL PINCHED NERVE  . Pinched nerve in neck   . History of rheumatic fever CHILDHOOD  . H/O tenderness in limb BILATEARL LEGS AND HEELS DUE TO TENDINITIS AND SPURS  . Mild acid reflux   . H/O hiatal hernia   . Urge urinary incontinence   . Snores   . Varicose veins     Left Leg  . Short of breath on exertion   . Anemia   . Dysrhythmia     A Fib during hospital stay 04/03/16  . Heart murmur ASYMPTOMATIC    s/p rheumatic fever  . Peripheral vascular disease (HCC)     legs.   . Chronic venous insufficiency   . Arthritis      Back, shoulders, arms  . Motion sickness     cars    SURGICAL HISTORY: Past Surgical History  Procedure Laterality Date  . Left breast ductal excision  10-11-2009    BENIGN  . Rotator cuff  repair  04-11-2008    LEFT SHOULDER  . Vaginal hysterectomy/ anterior & posterior repair/ transobturator sling  04-25-2005    CYSTOCELE/ RECTOCELE/ UTERINE PROLAPSE/ SUI  . Right breast lumpectomy      BENIGN  . Thyroid goiter removed  1970'S  . Dilation and curettage of uterus    . Hemrroidectomy and anal fissure repair  1965  . Left hand surg.  1980'S    EXTENSIVE REPAIR AND REMOVAL OF LIGAMENT/TENDON  (?CANCER)  . Removal right goin mass  1980'S    BENIGN  . Right foot surg  X3    REPAIR OF 2 TOES  . Lumbar laminectomy  1990  &  1976    RIGHT SIDE L4 - 5  & L5 - S1  . Tonsillectomy  CHILD  . Removal toe nailbed  AGE 24    ALL 10 TOES-- DEFORMED  . Cardiovascular stress test  APRIL 2009    NORMAL LVSF/ EF 61%/ NO ISCHEMIA  . Ganglion cyst excision  06/09/2012    Procedure: REMOVAL GANGLION OF WRIST;  Surgeon: Magnus Sinning, MD;  Location: [facility];  Service: Orthopedics;  Laterality: Left;  EXCISION OF GANGLION CYST LEFT RING FINGER  IV REGIONAL ANESTHESIA  . Abdominal hysterectomy    . Laparoscopic cholecystectomy  05-13-2002  Gall Bladder  . Spine surgery    . Esophagogastroduodenoscopy (egd) with propofol N/A 04/04/2016    Procedure: ESOPHAGOGASTRODUODENOSCOPY (EGD) WITH PROPOFOL;  Surgeon: Lucilla Lame, MD;  Location: [facility];  Service: Endoscopy;  Laterality: N/A;  . Colonoscopy with propofol N/A 04/17/2016    Procedure: COLONOSCOPY WITH PROPOFOL;  Surgeon: Lucilla Lame, MD;  Location: [facility];  Service: Endoscopy;  Laterality: N/A;  . Polypectomy  04/17/2016    Procedure: POLYPECTOMY;  Surgeon: Lucilla Lame, MD;  Location: [facility];  Service: Endoscopy;;    SOCIAL HISTORY: Social History   Social History  . Marital Status: Divorced    Spouse Name: N/A  . Number of Children: N/A  . Years of Education: N/A   Occupational History  . Not on file.   Social History Main Topics  . Smoking status: Former Smoker -- 40  years    Types: Cigarettes    Quit date: 06/04/1991  . Smokeless tobacco: Never Used  . Alcohol Use: No  . Drug Use: No  . Sexual Activity: Not on file   Other Topics Concern  . Not on file   Social History Narrative    FAMILY HISTORY: Family History  Problem Relation Age of Onset  . Deep vein thrombosis Mother     Right Leg  . Heart disease Mother     Before age 2-  CHF  . Varicose Veins Mother   . Heart attack Mother   . Heart disease Father     Before age 42-  PVD  . Heart attack Father   . Arthritis Father     Gout  . Hypertension Father   . Cancer Sister     Thyroid-Back  . Cancer Sister     Breast and Lung  . Heart disease Sister   . Heart disease Sister     ALLERGIES:  is allergic to latex; codeine; contrast media; indomethacin; and adhesive.  MEDICATIONS:  Current Outpatient Prescriptions  Medication Sig Dispense Refill  . acetaminophen (TYLENOL) 325 MG tablet Take 650 mg by mouth every 6 (six) hours as needed for moderate pain or headache.    . cyanocobalamin (,VITAMIN B-12,) 1000 MCG/ML injection Intramuscular ; 1 mL once a day for 7 days; then once every week for 4 weeks; and then once every month. 30 mL 0  . diltiazem (CARDIZEM CD) 120 MG 24 hr capsule Take by mouth.    . levothyroxine (SYNTHROID, LEVOTHROID) 125 MCG tablet Take 125 mcg by mouth every other day. To alternate with 187mcg dose    . levothyroxine (SYNTHROID, LEVOTHROID) 150 MCG tablet Take 150 mcg by mouth every other day. Reported on 05/21/2016    . Multiple Vitamin (MULTIVITAMIN WITH MINERALS) TABS tablet Take 1 tablet by mouth daily. 30 tablet 0  . potassium chloride SA (K-DUR,KLOR-CON) 20 MEQ tablet Take 1 tablet (20 mEq total) by mouth once. 30 tablet 3  . Syringe/Needle, Disp, (SYRINGE 3CC/22GX1") 22G X 1" 3 ML MISC 1 Syringe by Does not apply route once. Please dispense syringe/needle for b12 injections. Pt will self administer her own injections. 50 each 4   No current  facility-administered medications for this visit.      Marland Kitchen  PHYSICAL EXAMINATION:   Filed Vitals:   05/21/16 1113  BP: 143/68  Pulse: 73  Temp: 97.9 F (36.6 C)   Filed Weights   05/21/16 1113  Weight: 184 lb 3.1 oz (83.55 kg)    GENERAL: Well-nourished well-developed; Alert, no distress and  comfortable.  She is walking with a cane. EYES:No pallor. No icterus.  OROPHARYNX: no thrush or ulceration. NECK: supple, no masses felt LYMPH:  no palpable lymphadenopathy in the cervical, axillary or inguinal regions LUNGS: clear to auscultation and  No wheeze or crackles HEART/CVS: regular rate & rhythm and no murmurs; No lower extremity edema ABDOMEN: abdomen soft, non-tender and normal bowel sounds Musculoskeletal:no cyanosis of digits and no clubbing  PSYCH: alert & oriented x 3 with fluent speech NEURO: no focal motor/sensory deficits SKIN:  no rashes or significant lesions  LABORATORY DATA:  I have reviewed the data as listed Lab Results  Component Value Date   WBC 5.8 05/21/2016   HGB 13.8 05/21/2016   HCT 40.6 05/21/2016   MCV 100.0 05/21/2016   PLT 256 05/21/2016    Recent Labs  04/03/16 2028 04/04/16 0454 04/09/16 1305 05/21/16 1035  NA 140 143  --  140  K 3.4* 3.9  --  4.1  CL 109 113*  --  106  CO2 25 26  --  28  GLUCOSE 106* 100*  --  111*  BUN 18 12  --  18  CREATININE 0.73 0.69  --  0.99  CALCIUM 8.8* 8.0*  --  9.3  GFRNONAA >60 >60  --  57*  GFRAA >60 >60  --  >60  PROT 6.8  --  7.7  --   ALBUMIN 4.3  --  4.6  --   AST 24  --  24  --   ALT 15  --  16  --   ALKPHOS 51  --  58  --   BILITOT 1.8*  --  1.5*  --   BILIDIR  --   --  0.2  --   IBILI  --   --  1.3*  --      ASSESSMENT & PLAN:   # B12 deficiency anemia- autoimmune related [positive antiparietal antibodies/intrinsic antibodies]; history of vitiligo; hypothyroidism. Continue B12 IM on a monthly basis. New prescription given.   # Left lower lobe lung nodule 7 mm- check CT scan in 6  months. This has been ordered.  # Follow-up with me in 6 months with labs and CT scan prior.    Cammie Sickle, MD 05/21/2016 11:18 AM

## 2016-05-23 ENCOUNTER — Ambulatory Visit
Admission: RE | Admit: 2016-05-23 | Discharge: 2016-05-23 | Disposition: A | Discharge status: Home / Self Care | Payer: Medicare Other | Source: Ambulatory Visit | Attending: Legal Medicine | Admitting: Legal Medicine

## 2016-05-23 DIAGNOSIS — Z1231 Encounter for screening mammogram for malignant neoplasm of breast: Secondary | ICD-10-CM | POA: Diagnosis not present

## 2016-08-07 ENCOUNTER — Other Ambulatory Visit: Payer: Self-pay | Admitting: Internal Medicine

## 2016-08-07 DIAGNOSIS — R17 Unspecified jaundice: Secondary | ICD-10-CM

## 2016-08-07 DIAGNOSIS — D519 Vitamin B12 deficiency anemia, unspecified: Secondary | ICD-10-CM

## 2016-08-07 DIAGNOSIS — D518 Other vitamin B12 deficiency anemias: Secondary | ICD-10-CM

## 2016-08-11 ENCOUNTER — Other Ambulatory Visit: Payer: Self-pay

## 2016-08-21 DIAGNOSIS — K922 Gastrointestinal hemorrhage, unspecified: Secondary | ICD-10-CM | POA: Diagnosis not present

## 2016-08-21 DIAGNOSIS — R197 Diarrhea, unspecified: Secondary | ICD-10-CM | POA: Diagnosis not present

## 2016-08-21 DIAGNOSIS — R0789 Other chest pain: Secondary | ICD-10-CM | POA: Diagnosis not present

## 2016-08-21 DIAGNOSIS — I480 Paroxysmal atrial fibrillation: Secondary | ICD-10-CM | POA: Diagnosis not present

## 2016-09-05 DIAGNOSIS — E876 Hypokalemia: Secondary | ICD-10-CM | POA: Diagnosis not present

## 2016-09-05 DIAGNOSIS — R0789 Other chest pain: Secondary | ICD-10-CM | POA: Diagnosis not present

## 2016-09-19 DIAGNOSIS — I480 Paroxysmal atrial fibrillation: Secondary | ICD-10-CM | POA: Diagnosis not present

## 2016-09-25 ENCOUNTER — Ambulatory Visit: Admit: 2016-09-25 | Payer: Medicare Other | Admitting: Cardiology

## 2016-09-25 SURGERY — LEFT HEART CATH AND CORONARY ANGIOGRAPHY
Anesthesia: Moderate Sedation | Laterality: Left

## 2016-11-11 DIAGNOSIS — G609 Hereditary and idiopathic neuropathy, unspecified: Secondary | ICD-10-CM | POA: Diagnosis not present

## 2016-11-11 DIAGNOSIS — K922 Gastrointestinal hemorrhage, unspecified: Secondary | ICD-10-CM | POA: Diagnosis not present

## 2016-11-11 DIAGNOSIS — E782 Mixed hyperlipidemia: Secondary | ICD-10-CM | POA: Diagnosis not present

## 2016-11-11 DIAGNOSIS — I10 Essential (primary) hypertension: Secondary | ICD-10-CM | POA: Diagnosis not present

## 2016-11-11 DIAGNOSIS — Z6830 Body mass index (BMI) 30.0-30.9, adult: Secondary | ICD-10-CM | POA: Diagnosis not present

## 2016-11-11 DIAGNOSIS — I480 Paroxysmal atrial fibrillation: Secondary | ICD-10-CM | POA: Diagnosis not present

## 2016-11-18 ENCOUNTER — Ambulatory Visit
Admission: RE | Admit: 2016-11-18 | Discharge: 2016-11-18 | Disposition: A | Discharge status: Home / Self Care | Payer: Medicare Other | Source: Ambulatory Visit | Attending: Internal Medicine | Admitting: Internal Medicine

## 2016-11-18 DIAGNOSIS — I2510 Atherosclerotic heart disease of native coronary artery without angina pectoris: Secondary | ICD-10-CM | POA: Diagnosis not present

## 2016-11-18 DIAGNOSIS — I700 Atherosclerosis of aorta: Secondary | ICD-10-CM | POA: Insufficient documentation

## 2016-11-18 DIAGNOSIS — J439 Emphysema, unspecified: Secondary | ICD-10-CM | POA: Diagnosis not present

## 2016-11-18 DIAGNOSIS — R911 Solitary pulmonary nodule: Secondary | ICD-10-CM | POA: Diagnosis not present

## 2016-11-18 DIAGNOSIS — D518 Other vitamin B12 deficiency anemias: Secondary | ICD-10-CM

## 2016-11-18 DIAGNOSIS — R17 Unspecified jaundice: Secondary | ICD-10-CM

## 2016-11-18 DIAGNOSIS — D519 Vitamin B12 deficiency anemia, unspecified: Secondary | ICD-10-CM | POA: Insufficient documentation

## 2016-11-20 ENCOUNTER — Inpatient Hospital Stay: Payer: Medicare Other | Attending: Internal Medicine | Admitting: Internal Medicine

## 2016-11-20 ENCOUNTER — Encounter: Payer: Self-pay | Admitting: Internal Medicine

## 2016-11-20 ENCOUNTER — Inpatient Hospital Stay: Payer: Medicare Other

## 2016-11-20 DIAGNOSIS — Z87891 Personal history of nicotine dependence: Secondary | ICD-10-CM | POA: Diagnosis not present

## 2016-11-20 DIAGNOSIS — K449 Diaphragmatic hernia without obstruction or gangrene: Secondary | ICD-10-CM | POA: Diagnosis not present

## 2016-11-20 DIAGNOSIS — M1990 Unspecified osteoarthritis, unspecified site: Secondary | ICD-10-CM | POA: Insufficient documentation

## 2016-11-20 DIAGNOSIS — E538 Deficiency of other specified B group vitamins: Secondary | ICD-10-CM | POA: Insufficient documentation

## 2016-11-20 DIAGNOSIS — I872 Venous insufficiency (chronic) (peripheral): Secondary | ICD-10-CM | POA: Diagnosis not present

## 2016-11-20 DIAGNOSIS — I739 Peripheral vascular disease, unspecified: Secondary | ICD-10-CM | POA: Diagnosis not present

## 2016-11-20 DIAGNOSIS — I4891 Unspecified atrial fibrillation: Secondary | ICD-10-CM | POA: Diagnosis not present

## 2016-11-20 DIAGNOSIS — E039 Hypothyroidism, unspecified: Secondary | ICD-10-CM | POA: Diagnosis not present

## 2016-11-20 DIAGNOSIS — K909 Intestinal malabsorption, unspecified: Secondary | ICD-10-CM

## 2016-11-20 DIAGNOSIS — D519 Vitamin B12 deficiency anemia, unspecified: Secondary | ICD-10-CM | POA: Diagnosis not present

## 2016-11-20 DIAGNOSIS — R030 Elevated blood-pressure reading, without diagnosis of hypertension: Secondary | ICD-10-CM | POA: Diagnosis not present

## 2016-11-20 DIAGNOSIS — Z79899 Other long term (current) drug therapy: Secondary | ICD-10-CM | POA: Diagnosis not present

## 2016-11-20 DIAGNOSIS — K219 Gastro-esophageal reflux disease without esophagitis: Secondary | ICD-10-CM | POA: Insufficient documentation

## 2016-11-20 DIAGNOSIS — R911 Solitary pulmonary nodule: Secondary | ICD-10-CM | POA: Diagnosis not present

## 2016-11-20 DIAGNOSIS — D518 Other vitamin B12 deficiency anemias: Secondary | ICD-10-CM

## 2016-11-20 LAB — CBC WITH DIFFERENTIAL/PLATELET
BASOS PCT: 0 %
Basophils Absolute: 0 10*3/uL (ref 0–0.1)
EOS ABS: 0.1 10*3/uL (ref 0–0.7)
EOS PCT: 2 %
HCT: 41.2 % (ref 35.0–47.0)
HEMOGLOBIN: 14.1 g/dL (ref 12.0–16.0)
Lymphocytes Relative: 33 %
Lymphs Abs: 2 10*3/uL (ref 1.0–3.6)
MCH: 30.7 pg (ref 26.0–34.0)
MCHC: 34.3 g/dL (ref 32.0–36.0)
MCV: 89.4 fL (ref 80.0–100.0)
Monocytes Absolute: 0.6 10*3/uL (ref 0.2–0.9)
Monocytes Relative: 9 %
NEUTROS PCT: 56 %
Neutro Abs: 3.3 10*3/uL (ref 1.4–6.5)
PLATELETS: 219 10*3/uL (ref 150–440)
RBC: 4.6 MIL/uL (ref 3.80–5.20)
RDW: 13.8 % (ref 11.5–14.5)
WBC: 6 10*3/uL (ref 3.6–11.0)

## 2016-11-20 NOTE — Progress Notes (Signed)
[place] NOTE  Patient Care Team: Lillard Anes, MD as PCP - General (Family Medicine)  CHIEF COMPLAINTS/PURPOSE OF CONSULTATION:   #April 2017-B12 DEF MACROCYTIC ANEMIA [B12 =50; positive IF/anti-parietal AB] autoimmune related; B12 IM monthly [at home]  # LLL lung nodule 42mm [april 2017]; prior hx of smoking; DEC 2017- STABLE  # A.fib with RVR- not on anti-coag; hypothyroidism [s/p surgery]; Peripheral neuropathy- ? B 12 def  HISTORY OF PRESENTING ILLNESS:  Sydney Ayala 70 y.o.  female history of B12 deficiency/ macrocytic anemia is here for follow-up. Patient is taking B12 injections on a monthly basis now. Good appetite. She continues to have chronic mild tingling and numbness of the extremities.  Diarrhea improved. No nausea no vomiting. No cough or shortness of breath or chest pain.  ROS: A complete 10 point review of system is done which is negative except mentioned above in history of present illness  MEDICAL HISTORY:  Past Medical History:  Diagnosis Date  . Anemia   . Arthritis     Back, shoulders, arms  . Chronic low back pain LUMBAR   RELEIVE W/ TENS UNIT PRN AND CHRIOPACTOR  . Chronic venous insufficiency   . Dysrhythmia    A Fib during hospital stay 04/03/16  . Ganglion cyst LEFT RING FINGER  . H/O hiatal hernia   . H/O tenderness in limb BILATEARL LEGS AND HEELS DUE TO TENDINITIS AND SPURS  . Heart murmur ASYMPTOMATIC   s/p rheumatic fever  . History of rheumatic fever CHILDHOOD  . Hypothyroidism   . Left hand weakness   . Mild acid reflux   . Motion sickness    cars  . Numbness and tingling BILATERAL ARMS SECONDARY TO CERVICAL PINCHED NERVE  . Peripheral vascular disease (HCC)    legs.   . Pinched nerve in neck   . Short of breath on exertion   . Snores   . Urge urinary incontinence   . Varicose veins    Left Leg    SURGICAL HISTORY: Past Surgical History:  Procedure Laterality Date  . ABDOMINAL HYSTERECTOMY     . CARDIOVASCULAR STRESS TEST  APRIL 2009   NORMAL LVSF/ EF 61%/ NO ISCHEMIA  . COLONOSCOPY WITH PROPOFOL N/A 04/17/2016   Procedure: COLONOSCOPY WITH PROPOFOL;  Surgeon: Lucilla Lame, MD;  Location: [place];  Service: Endoscopy;  Laterality: N/A;  . DILATION AND CURETTAGE OF UTERUS    . ESOPHAGOGASTRODUODENOSCOPY (EGD) WITH PROPOFOL N/A 04/04/2016   Procedure: ESOPHAGOGASTRODUODENOSCOPY (EGD) WITH PROPOFOL;  Surgeon: Lucilla Lame, MD;  Location: [facility];  Service: Endoscopy;  Laterality: N/A;  . GANGLION CYST EXCISION  06/09/2012   Procedure: REMOVAL GANGLION OF WRIST;  Surgeon: Magnus Sinning, MD;  Location: [place];  Service: Orthopedics;  Laterality: Left;  EXCISION OF GANGLION CYST LEFT RING FINGER  IV [facility]AL ANESTHESIA  . [place]  . LAPAROSCOPIC CHOLECYSTECTOMY  05-13-2002   Gall Bladder  . LEFT BREAST DUCTAL EXCISION  10-11-2009   BENIGN  . LEFT HAND SURG.  1980'S   EXTENSIVE REPAIR AND REMOVAL OF LIGAMENT/TENDON  (?CANCER)  . LUMBAR LAMINECTOMY  1990  &  1976   RIGHT SIDE L4 - 5  & L5 - S1  . POLYPECTOMY  04/17/2016   Procedure: POLYPECTOMY;  Surgeon: Lucilla Lame, MD;  Location: [facility];  Service: Endoscopy;;  . REMOVAL RIGHT GOIN MASS  1980'S   BENIGN  . REMOVAL TOE NAILBED  AGE 38   ALL 10 TOES-- DEFORMED  . RIGHT BREAST LUMPECTOMY     BENIGN  . RIGHT FOOT SURG  X3   REPAIR OF 2 TOES  . ROTATOR CUFF REPAIR  04-11-2008   LEFT SHOULDER  . SPINE SURGERY    . THYROID GOITER REMOVED  1970'S  . TONSILLECTOMY  CHILD  . VAGINAL HYSTERECTOMY/ ANTERIOR & POSTERIOR REPAIR/ TRANSOBTURATOR [facility]  04-25-2005   CYSTOCELE/ RECTOCELE/ UTERINE PROLAPSE/ SUI    SOCIAL HISTORY: Social History   Social History  . Marital status: Divorced    Spouse name: N/A  . Number of children: N/A  . Years of education: N/A   Occupational History  . Not on file.   Social History Main Topics  . Smoking  status: Former Smoker    Years: 40.00    Types: Cigarettes    Quit date: 06/04/1991  . Smokeless tobacco: Never Used  . Alcohol use No  . Drug use: No  . Sexual activity: Not on file   Other Topics Concern  . Not on file   Social History Narrative  . No narrative on file    FAMILY HISTORY: Family History  Problem Relation Age of Onset  . Deep vein thrombosis Mother     Right Leg  . Heart disease Mother     Before age 16-  CHF  . Varicose Veins Mother   . Heart attack Mother   . Heart disease Father     Before age 53-  PVD  . Heart attack Father   . Arthritis Father     Gout  . Hypertension Father   . Cancer Sister     Thyroid-Back  . Cancer Sister     Breast and Lung  . Heart disease Sister   . Heart disease Sister     ALLERGIES:  is allergic to latex; codeine; contrast media [iodinated diagnostic agents]; indomethacin; and adhesive [tape].  MEDICATIONS:  Current Outpatient Prescriptions  Medication Sig Dispense Refill  . Biotin 1 MG CAPS Take 1 capsule by mouth daily.    . cyanocobalamin (,VITAMIN B-12,) 1000 MCG/ML injection Intramuscular ; 1 mL once every month. 30 mL 10  . diltiazem (CARDIZEM CD) 120 MG 24 hr capsule Take 120 mg by mouth daily.     . ferrous sulfate 325 (65 FE) MG tablet Take 325 mg by mouth daily.  11  . levothyroxine (SYNTHROID, LEVOTHROID) 125 MCG tablet Take 125 mcg by mouth every other day. To alternate with 159mcg dose    . levothyroxine (SYNTHROID, LEVOTHROID) 150 MCG tablet Take 150 mcg by mouth every other day. Reported on 05/21/2016    . Multiple Vitamin (MULTIVITAMIN WITH MINERALS) TABS tablet Take 1 tablet by mouth daily. 30 tablet 0  . potassium chloride SA (K-DUR,KLOR-CON) 20 MEQ tablet Take 1 tablet (20 mEq total) by mouth once. 30 tablet 3  . Syringe/Needle, Disp, (SYRINGE 3CC/22GX1") 22G X 1" 3 ML MISC 1 Syringe by Does not apply route once. Please dispense syringe/needle for b12 injections. Pt will self administer her own  injections. 50 each 4  . acetaminophen (TYLENOL) 325 MG tablet Take 650 mg by mouth every 6 (six) hours as needed for moderate pain or headache.     No current facility-administered medications for this visit.       Marland Kitchen  PHYSICAL EXAMINATION:   Vitals:   11/20/16 1048  BP: (!) 151/86  Pulse: 74  Resp: 20  Temp: 98.2 F (36.8 C)   Filed  Weights   11/20/16 1048  Weight: 186 lb 6.4 oz (84.6 kg)    GENERAL: Well-nourished well-developed; Alert, no distress and comfortable.  She is walking with a cane. EYES:No pallor. No icterus.  OROPHARYNX: no thrush or ulceration. NECK: supple, no masses felt LYMPH:  no palpable lymphadenopathy in the cervical, axillary or inguinal regions LUNGS: clear to auscultation and  No wheeze or crackles HEART/CVS: regular rate & rhythm and no murmurs; No lower extremity edema ABDOMEN: abdomen soft, non-tender and normal bowel sounds Musculoskeletal:no cyanosis of digits and no clubbing  PSYCH: alert & oriented x 3 with fluent speech NEURO: no focal motor/sensory deficits SKIN:  no rashes or significant lesions  LABORATORY DATA:  I have reviewed the data as listed Lab Results  Component Value Date   WBC 6.0 11/20/2016   HGB 14.1 11/20/2016   HCT 41.2 11/20/2016   MCV 89.4 11/20/2016   PLT 219 11/20/2016    Recent Labs  04/03/16 2028 04/04/16 0454 04/09/16 1305 05/21/16 1035  NA 140 143  --  140  K 3.4* 3.9  --  4.1  CL 109 113*  --  106  CO2 25 26  --  28  GLUCOSE 106* 100*  --  111*  BUN 18 12  --  18  CREATININE 0.73 0.69  --  0.99  CALCIUM 8.8* 8.0*  --  9.3  GFRNONAA >60 >60  --  57*  GFRAA >60 >60  --  >60  PROT 6.8  --  7.7  --   ALBUMIN 4.3  --  4.6  --   AST 24  --  24  --   ALT 15  --  16  --   ALKPHOS 51  --  58  --   BILITOT 1.8*  --  1.5*  --   BILIDIR  --   --  0.2  --   IBILI  --   --  1.3*  --    IMPRESSION: 1. Stable 7 mm nodule in the lingula. Given stability, repeat CT 18-24 months (from today's scan) is  considered optional for low-risk patients, but is recommended for high-risk patients. This recommendation follows the consensus statement: Guidelines for Management of Incidental Pulmonary Nodules Detected on CT Images: From the Fleischner Society 2017; Radiology 2017; 284:228-243. 2. Emphysema. 3. Coronary artery and thoracic aortic atherosclerosis.   Electronically Signed   By: Misty Stanley M.D.   On: 11/18/2016 13:22   ASSESSMENT & PLAN:  Vitamin B12 deficiency due to intestinal malabsorption # B12 deficiency anemia- autoimmune related [positive antiparietal antibodies/intrinsic antibodies]; history of vitiligo; hypothyroidism. Continue B12 IM on a monthly basis.  Again discussed the pathology of autoimmune disorders in detail.  # Left lower lobe lung nodule 7 mm- STABLE; reviewed the CT scan; follow up scan in 12 months. Patient very anxious [Sr. died of lung cancer]  # Elevated blood pressure systolic [id number]; discuss at length with the patient to make a log of her blood pressures; and then was evaluated by PCP for better of local control of blood pressure.  # Follow-up with me in 12 months with labs ; CT ordered; 1 week prior.   # 25 minutes face-to-face with the patient discussing the above plan of care; more than 50% of time spent on prognosis/ natural history; counseling and coordination.  # I reviewed the blood work- with the patient in detail; also reviewed the imaging independently [as summarized above]; and with the patient in detail.  Cammie Sickle, MD 11/20/2016 1:42 PM

## 2016-11-20 NOTE — Assessment & Plan Note (Addendum)
#   B12 deficiency anemia- autoimmune related [positive antiparietal antibodies/intrinsic antibodies]; history of vitiligo; hypothyroidism. Continue B12 IM on a monthly basis.  Again discussed the pathology of autoimmune disorders in detail.  # Left lower lobe lung nodule 7 mm- STABLE; reviewed the CT scan; follow up scan in 12 months. Patient very anxious [Sr. died of lung cancer]  # Elevated blood pressure systolic [id number]; discuss at length with the patient to make a log of her blood pressures; and then was evaluated by PCP for better of local control of blood pressure.  # Follow-up with me in 12 months with labs ; CT ordered; 1 week prior.   # 25 minutes face-to-face with the patient discussing the above plan of care; more than 50% of time spent on prognosis/ natural history; counseling and coordination.

## 2016-11-20 NOTE — Assessment & Plan Note (Deleted)
#   B12 deficiency anemia- autoimmune related [positive antiparietal antibodies/intrinsic antibodies]; history of vitiligo; hypothyroidism. Continue B12 IM on a monthly basis. New prescription given.   # Left lower lobe lung nodule 7 mm- STABLE; reviewed the CT scan; follow up scan in 12 months.   # Follow-up with me in 12 months with labs and CT scan prior.

## 2016-11-20 NOTE — Progress Notes (Signed)
Patient here for f/u anemia.  Pt has no medical complaints.

## 2016-12-15 DIAGNOSIS — S46101A Unspecified injury of muscle, fascia and tendon of long head of biceps, right arm, initial encounter: Secondary | ICD-10-CM

## 2016-12-15 HISTORY — DX: Unspecified injury of muscle, fascia and tendon of long head of biceps, right arm, initial encounter: S46.101A

## 2017-03-30 DIAGNOSIS — M1712 Unilateral primary osteoarthritis, left knee: Secondary | ICD-10-CM | POA: Diagnosis not present

## 2017-04-03 DIAGNOSIS — K922 Gastrointestinal hemorrhage, unspecified: Secondary | ICD-10-CM | POA: Diagnosis not present

## 2017-04-03 DIAGNOSIS — I480 Paroxysmal atrial fibrillation: Secondary | ICD-10-CM | POA: Diagnosis not present

## 2017-04-13 DIAGNOSIS — E782 Mixed hyperlipidemia: Secondary | ICD-10-CM | POA: Diagnosis not present

## 2017-04-13 DIAGNOSIS — I10 Essential (primary) hypertension: Secondary | ICD-10-CM | POA: Diagnosis not present

## 2017-04-13 DIAGNOSIS — E038 Other specified hypothyroidism: Secondary | ICD-10-CM | POA: Diagnosis not present

## 2017-04-15 DIAGNOSIS — M1711 Unilateral primary osteoarthritis, right knee: Secondary | ICD-10-CM | POA: Diagnosis not present

## 2017-06-30 DIAGNOSIS — A938 Other specified arthropod-borne viral fevers: Secondary | ICD-10-CM | POA: Diagnosis not present

## 2017-06-30 DIAGNOSIS — A6920 Lyme disease, unspecified: Secondary | ICD-10-CM | POA: Diagnosis not present

## 2017-06-30 DIAGNOSIS — R600 Localized edema: Secondary | ICD-10-CM | POA: Diagnosis not present

## 2017-08-07 DIAGNOSIS — R21 Rash and other nonspecific skin eruption: Secondary | ICD-10-CM | POA: Diagnosis not present

## 2017-08-07 DIAGNOSIS — S8392XA Sprain of unspecified site of left knee, initial encounter: Secondary | ICD-10-CM | POA: Diagnosis not present

## 2017-08-11 DIAGNOSIS — M25562 Pain in left knee: Secondary | ICD-10-CM | POA: Diagnosis not present

## 2017-08-12 DIAGNOSIS — M25562 Pain in left knee: Secondary | ICD-10-CM | POA: Diagnosis not present

## 2017-08-12 DIAGNOSIS — M6281 Muscle weakness (generalized): Secondary | ICD-10-CM | POA: Diagnosis not present

## 2017-08-12 DIAGNOSIS — R2689 Other abnormalities of gait and mobility: Secondary | ICD-10-CM | POA: Diagnosis not present

## 2017-08-19 DIAGNOSIS — I10 Essential (primary) hypertension: Secondary | ICD-10-CM | POA: Diagnosis not present

## 2017-08-19 DIAGNOSIS — Z23 Encounter for immunization: Secondary | ICD-10-CM | POA: Diagnosis not present

## 2017-08-19 DIAGNOSIS — M1712 Unilateral primary osteoarthritis, left knee: Secondary | ICD-10-CM | POA: Diagnosis not present

## 2017-08-19 DIAGNOSIS — I481 Persistent atrial fibrillation: Secondary | ICD-10-CM | POA: Diagnosis not present

## 2017-08-19 DIAGNOSIS — E782 Mixed hyperlipidemia: Secondary | ICD-10-CM | POA: Diagnosis not present

## 2017-08-19 DIAGNOSIS — D510 Vitamin B12 deficiency anemia due to intrinsic factor deficiency: Secondary | ICD-10-CM | POA: Diagnosis not present

## 2017-08-19 DIAGNOSIS — E039 Hypothyroidism, unspecified: Secondary | ICD-10-CM | POA: Diagnosis not present

## 2017-09-08 DIAGNOSIS — M25562 Pain in left knee: Secondary | ICD-10-CM | POA: Diagnosis not present

## 2017-09-14 ENCOUNTER — Other Ambulatory Visit: Payer: Self-pay | Admitting: Physician Assistant

## 2017-09-14 DIAGNOSIS — M25562 Pain in left knee: Secondary | ICD-10-CM

## 2017-09-14 DIAGNOSIS — S8992XA Unspecified injury of left lower leg, initial encounter: Secondary | ICD-10-CM

## 2017-09-25 ENCOUNTER — Ambulatory Visit
Admission: RE | Admit: 2017-09-25 | Discharge: 2017-09-25 | Disposition: A | Discharge status: Home / Self Care | Payer: Medicare Other | Source: Ambulatory Visit | Attending: Physician Assistant | Admitting: Physician Assistant

## 2017-09-25 DIAGNOSIS — S8992XA Unspecified injury of left lower leg, initial encounter: Secondary | ICD-10-CM

## 2017-09-25 DIAGNOSIS — M25562 Pain in left knee: Secondary | ICD-10-CM

## 2017-09-28 DIAGNOSIS — M25562 Pain in left knee: Secondary | ICD-10-CM | POA: Diagnosis not present

## 2017-09-28 DIAGNOSIS — K922 Gastrointestinal hemorrhage, unspecified: Secondary | ICD-10-CM | POA: Diagnosis not present

## 2017-09-28 DIAGNOSIS — I480 Paroxysmal atrial fibrillation: Secondary | ICD-10-CM | POA: Diagnosis not present

## 2017-09-29 ENCOUNTER — Other Ambulatory Visit: Payer: Medicare Other

## 2017-10-05 DIAGNOSIS — M1712 Unilateral primary osteoarthritis, left knee: Secondary | ICD-10-CM | POA: Diagnosis not present

## 2017-10-05 DIAGNOSIS — S83232A Complex tear of medial meniscus, current injury, left knee, initial encounter: Secondary | ICD-10-CM | POA: Diagnosis not present

## 2017-10-15 ENCOUNTER — Other Ambulatory Visit: Payer: Self-pay | Admitting: Internal Medicine

## 2017-10-15 DIAGNOSIS — R911 Solitary pulmonary nodule: Secondary | ICD-10-CM

## 2017-10-15 DIAGNOSIS — D519 Vitamin B12 deficiency anemia, unspecified: Secondary | ICD-10-CM

## 2017-10-15 DIAGNOSIS — R17 Unspecified jaundice: Secondary | ICD-10-CM

## 2017-10-15 DIAGNOSIS — D518 Other vitamin B12 deficiency anemias: Secondary | ICD-10-CM

## 2017-10-19 DIAGNOSIS — M1712 Unilateral primary osteoarthritis, left knee: Secondary | ICD-10-CM | POA: Diagnosis not present

## 2017-10-19 DIAGNOSIS — S83232A Complex tear of medial meniscus, current injury, left knee, initial encounter: Secondary | ICD-10-CM | POA: Diagnosis not present

## 2017-11-13 ENCOUNTER — Ambulatory Visit
Admission: RE | Admit: 2017-11-13 | Discharge: 2017-11-13 | Disposition: A | Discharge status: Home / Self Care | Payer: Medicare Other | Source: Ambulatory Visit | Attending: Internal Medicine | Admitting: Internal Medicine

## 2017-11-13 DIAGNOSIS — J439 Emphysema, unspecified: Secondary | ICD-10-CM | POA: Diagnosis not present

## 2017-11-13 DIAGNOSIS — I700 Atherosclerosis of aorta: Secondary | ICD-10-CM | POA: Insufficient documentation

## 2017-11-13 DIAGNOSIS — R911 Solitary pulmonary nodule: Secondary | ICD-10-CM | POA: Diagnosis not present

## 2017-11-13 DIAGNOSIS — I2510 Atherosclerotic heart disease of native coronary artery without angina pectoris: Secondary | ICD-10-CM | POA: Diagnosis not present

## 2017-11-13 DIAGNOSIS — K449 Diaphragmatic hernia without obstruction or gangrene: Secondary | ICD-10-CM | POA: Diagnosis not present

## 2017-11-15 DIAGNOSIS — J209 Acute bronchitis, unspecified: Secondary | ICD-10-CM | POA: Diagnosis not present

## 2017-11-16 ENCOUNTER — Other Ambulatory Visit: Payer: Self-pay

## 2017-11-16 ENCOUNTER — Encounter
Admission: RE | Admit: 2017-11-16 | Discharge: 2017-11-16 | Disposition: A | Discharge status: Home / Self Care | Payer: Medicare Other | Source: Ambulatory Visit | Attending: Surgery | Admitting: Surgery

## 2017-11-16 DIAGNOSIS — R9431 Abnormal electrocardiogram [ECG] [EKG]: Secondary | ICD-10-CM | POA: Diagnosis not present

## 2017-11-16 DIAGNOSIS — Z01818 Encounter for other preprocedural examination: Secondary | ICD-10-CM | POA: Diagnosis not present

## 2017-11-16 DIAGNOSIS — I4891 Unspecified atrial fibrillation: Secondary | ICD-10-CM | POA: Diagnosis not present

## 2017-11-16 HISTORY — DX: Dyspnea, unspecified: R06.00

## 2017-11-16 LAB — CBC
HEMATOCRIT: 41.1 % (ref 35.0–47.0)
HEMOGLOBIN: 13.7 g/dL (ref 12.0–16.0)
MCH: 30.1 pg (ref 26.0–34.0)
MCHC: 33.2 g/dL (ref 32.0–36.0)
MCV: 90.7 fL (ref 80.0–100.0)
Platelets: 235 10*3/uL (ref 150–440)
RBC: 4.54 MIL/uL (ref 3.80–5.20)
RDW: 14.1 % (ref 11.5–14.5)
WBC: 10.1 10*3/uL (ref 3.6–11.0)

## 2017-11-16 LAB — POTASSIUM: POTASSIUM: 4.8 mmol/L (ref 3.5–5.1)

## 2017-11-16 NOTE — Pre-Procedure Instructions (Signed)
Anesthesia patient seen 12/2 at walk in clinic and treated for bronchitis.

## 2017-11-16 NOTE — Patient Instructions (Signed)
Your procedure is scheduled on: Thursday 11/26/17 Report to [place]. 2ND FLOOR MEDICAL MALL ENTRANCE. To find out your arrival time please call [phone] between 1PM - 3PM on Wednesday 11/25/17.  Remember: Instructions that are not followed completely may result in serious medical risk, up to and including death, or upon the discretion of your surgeon and anesthesiologist your surgery may need to be rescheduled.    __X__ 1. Do not eat anything after midnight the night before your    procedure.  No gum chewing or hard candies.  You may drink clear   liquids up to 2 hours before you are scheduled to arrive at the   hospital for your procedure. Do not drink clear liquids within 2   hours of scheduled arrival to the hospital as this may lead to your   procedure being delayed or rescheduled.       Clear liquids include:   Water or Apple juice without pulp   Clear carbohydrate beverage such as Clearfast or Gatorade   Black coffee or Clear Tea (no milk, no creamer, do not add anything   to the coffee or tea)    Diabetics should only drink water   __X__ 2. No Alcohol for 24 hours before or after surgery.   ____ 3. Bring all medications with you on the day of surgery if instructed.    __X__ 4. Notify your doctor if there is any change in your medical condition     (cold, fever, infections).             __X___5. No smoking within 24 hours of your surgery.     Do not wear jewelry, make-up, hairpins, clips or nail polish.  Do not wear lotions, powders, or perfumes.   Do not shave 48 hours prior to surgery. Men may shave face and neck.  Do not bring valuables to the hospital.    [facility] is not responsible for any belongings or valuables.               Contacts, dentures or bridgework may not be worn into surgery.  Leave your suitcase in the car. After surgery it may be brought to your room.  For patients admitted to the hospital, discharge time is determined by your                 treatment team.   Patients discharged the day of surgery will not be allowed to drive home.   Please read over the following fact sheets that you were given:   MRSA Information   __X__ Take these medicines the morning of surgery with A SIP OF WATER:    1. DILTIAZEM  2. SYNTHROID  3.   4.  5.  6.  ____ Fleet Enema (as directed)   __X__ Use CHG Soap/SAGE wipes as directed  ____ Use inhalers on the day of surgery  ____ Stop metformin 2 days prior to surgery    ____ Take 1/2 of usual insulin dose the night before surgery and none on the morning of surgery.   ____ Stop Coumadin/Plavix/aspirin on   __X__ Stop Anti-inflammatories such as Advil, Aleve, Ibuprofen, Motrin, Naproxen, Naprosyn, Goodies,powder, or aspirin products. TODAY  OK to take Tylenol.   __X__ Stop supplements, Vitamin E, Fish Oil until after surgery.    ____ Bring C-Pap to the hospital.

## 2017-11-18 ENCOUNTER — Telehealth: Payer: Self-pay | Admitting: *Deleted

## 2017-11-18 NOTE — Telephone Encounter (Signed)
-----   Message from Liborio Negron Torres sent at 11/17/2017 11:58 AM EST ----- Regarding: LABS? Contact: [phone] Pt just had labs for preop-pt asking if she needs labs Friday still with her result appt?

## 2017-11-18 NOTE — Telephone Encounter (Signed)
Lab apt can be cnl, but pt needs to keep her md apt to review ct scan results

## 2017-11-20 ENCOUNTER — Other Ambulatory Visit: Payer: Self-pay

## 2017-11-20 ENCOUNTER — Inpatient Hospital Stay: Payer: Medicare Other | Attending: Internal Medicine | Admitting: Internal Medicine

## 2017-11-20 ENCOUNTER — Inpatient Hospital Stay: Payer: Medicare Other

## 2017-11-20 ENCOUNTER — Encounter: Payer: Self-pay | Admitting: Internal Medicine

## 2017-11-20 VITALS — BP 166/109 | HR 84 | Temp 98.1°F | Resp 16 | Ht 66.0 in | Wt 193.4 lb

## 2017-11-20 DIAGNOSIS — R030 Elevated blood-pressure reading, without diagnosis of hypertension: Secondary | ICD-10-CM

## 2017-11-20 DIAGNOSIS — J209 Acute bronchitis, unspecified: Secondary | ICD-10-CM

## 2017-11-20 DIAGNOSIS — Z87891 Personal history of nicotine dependence: Secondary | ICD-10-CM | POA: Diagnosis not present

## 2017-11-20 DIAGNOSIS — R911 Solitary pulmonary nodule: Secondary | ICD-10-CM | POA: Diagnosis not present

## 2017-11-20 DIAGNOSIS — E039 Hypothyroidism, unspecified: Secondary | ICD-10-CM | POA: Diagnosis not present

## 2017-11-20 DIAGNOSIS — K449 Diaphragmatic hernia without obstruction or gangrene: Secondary | ICD-10-CM | POA: Insufficient documentation

## 2017-11-20 DIAGNOSIS — J439 Emphysema, unspecified: Secondary | ICD-10-CM | POA: Insufficient documentation

## 2017-11-20 DIAGNOSIS — I2510 Atherosclerotic heart disease of native coronary artery without angina pectoris: Secondary | ICD-10-CM | POA: Insufficient documentation

## 2017-11-20 DIAGNOSIS — E538 Deficiency of other specified B group vitamins: Secondary | ICD-10-CM | POA: Insufficient documentation

## 2017-11-20 DIAGNOSIS — I700 Atherosclerosis of aorta: Secondary | ICD-10-CM | POA: Insufficient documentation

## 2017-11-20 DIAGNOSIS — I872 Venous insufficiency (chronic) (peripheral): Secondary | ICD-10-CM | POA: Diagnosis not present

## 2017-11-20 DIAGNOSIS — L80 Vitiligo: Secondary | ICD-10-CM | POA: Diagnosis not present

## 2017-11-20 DIAGNOSIS — Z79899 Other long term (current) drug therapy: Secondary | ICD-10-CM | POA: Insufficient documentation

## 2017-11-20 DIAGNOSIS — K909 Intestinal malabsorption, unspecified: Secondary | ICD-10-CM | POA: Insufficient documentation

## 2017-11-20 DIAGNOSIS — I739 Peripheral vascular disease, unspecified: Secondary | ICD-10-CM | POA: Diagnosis not present

## 2017-11-20 NOTE — Assessment & Plan Note (Signed)
#   B12 deficiency anemia- autoimmune related [positive antiparietal antibodies/intrinsic antibodies]; history of vitiligo; hypothyroidism. Continue B12 IM on a monthly basis. Hb 13.7 today. Continue B12 injections.   # Left lower lobe lung nodule 7 mm- STABLE; reviewed the Nov 30th 2018-CT scan; follow up scan in 12 months.  # acute bronchitis- dimetap [costly]; defer to talking pharmacy re: alternative; s/p   # Elevated blood pressure systolic 209/470J; discuss at length with the patient to make a log of her blood pressures.  Again re-iterated the importance of follow up with PCP.   # Follow-up with me in 12 months with labs ; CT ordered; 1 week prior.   # I reviewed the blood work- with the patient in detail; also reviewed the imaging independently [as summarized above]; and with the patient in detail.   # 25 minutes face-to-face with the patient discussing the above plan of care; more than 50% of time spent on prognosis/ natural history; counseling and coordination.

## 2017-11-20 NOTE — Progress Notes (Signed)
Patient here for follow up. She has an upper respiratory infection. She is under the care of urgent Care. She is also expecting results. BP elevated today 166/109.

## 2017-11-20 NOTE — Progress Notes (Signed)
[name]  Patient Care Team: Sydney Anes, MD as PCP - General (Family Medicine)  CHIEF COMPLAINTS/PURPOSE OF CONSULTATION:   #April 2017-B12 DEF MACROCYTIC ANEMIA [B12 =50; positive IF/anti-parietal AB] autoimmune related; B12 IM monthly [at home]  # LLL lung nodule 19mm [april 2017]; prior hx of smoking; NOV 2018-- STABLE  # A.fib with RVR- not on anti-coag; hypothyroidism [s/p surgery]; Peripheral neuropathy- ? B 12 def  HISTORY OF PRESENTING ILLNESS:  Sydney Ayala 71 y.o.  female history of B12 deficiency/ macrocytic anemia is here for follow-up; history of lung nodules is here to review the results of the CT scan.  In the interim patient was diagnosed with acute bronchitis-for which she needed antibiotics cough medication.  Symptoms have improved.  However not complete resolved.  Patient is taking B12 injections on a monthly basis now.  Patient has good appetite. She continues to have chronic mild tingling and numbness of the extremities.  She has chronic back pain.  She also has chronic knee pain.  She is awaiting arthroscopic surgery on the left knee next week.   ROS: A complete 10 point review of system is done which is negative except mentioned above in history of present illness  MEDICAL HISTORY:  Past Medical History:  Diagnosis Date  . Anemia   . Arthritis     Back, shoulders, arms  . Chronic low back pain LUMBAR   RELEIVE W/ TENS UNIT PRN AND CHRIOPACTOR  . Chronic venous insufficiency   . Dyspnea   . Dysrhythmia    A Fib during hospital stay 04/03/16  . Ganglion cyst LEFT RING FINGER  . H/O hiatal hernia   . H/O tenderness in limb BILATEARL LEGS AND HEELS DUE TO TENDINITIS AND SPURS  . Heart murmur ASYMPTOMATIC   s/p rheumatic fever  . History of rheumatic fever CHILDHOOD  . Hypothyroidism   . Left hand weakness   . Mild acid reflux   . Motion sickness    cars  . Numbness and tingling BILATERAL ARMS SECONDARY TO  CERVICAL PINCHED NERVE  . Peripheral vascular disease (HCC)    legs.   . Pinched nerve in neck   . Short of breath on exertion   . Snores   . Urge urinary incontinence   . Varicose veins    Left Leg    SURGICAL HISTORY: Past Surgical History:  Procedure Laterality Date  . ABDOMINAL HYSTERECTOMY    . CARDIOVASCULAR STRESS TEST  APRIL 2009   NORMAL LVSF/ EF 61%/ NO ISCHEMIA  . COLONOSCOPY WITH PROPOFOL N/A 04/17/2016   Procedure: COLONOSCOPY WITH PROPOFOL;  Surgeon: Sydney Lame, MD;  Location: [place];  Service: Endoscopy;  Laterality: N/A;  . DILATION AND CURETTAGE OF UTERUS    . ESOPHAGOGASTRODUODENOSCOPY (EGD) WITH PROPOFOL N/A 04/04/2016   Procedure: ESOPHAGOGASTRODUODENOSCOPY (EGD) WITH PROPOFOL;  Surgeon: Sydney Lame, MD;  Location: [facility];  Service: Endoscopy;  Laterality: N/A;  . GANGLION CYST EXCISION  06/09/2012   Procedure: REMOVAL GANGLION OF WRIST;  Surgeon: Sydney Sinning, MD;  Location: [place];  Service: Orthopedics;  Laterality: Left;  EXCISION OF GANGLION CYST LEFT RING FINGER  IV REGIONAL ANESTHESIA  . [place]  . LAPAROSCOPIC CHOLECYSTECTOMY  05-13-2002   Gall Bladder  . LEFT BREAST DUCTAL EXCISION  10-11-2009   BENIGN  . LEFT HAND SURG.  1980'S   EXTENSIVE REPAIR AND REMOVAL OF LIGAMENT/TENDON  (?CANCER)  . LUMBAR LAMINECTOMY  1990  &  1976   RIGHT SIDE L4 - 5  & L5 - S1  . POLYPECTOMY  04/17/2016   Procedure: POLYPECTOMY;  Surgeon: Sydney Lame, MD;  Location: [facility];  Service: Endoscopy;;  . REMOVAL RIGHT GOIN MASS  1980'S   BENIGN  . REMOVAL TOE NAILBED  AGE 66   ALL 10 TOES-- DEFORMED  . RIGHT BREAST LUMPECTOMY     BENIGN  . RIGHT FOOT SURG  X3   REPAIR OF 2 TOES  . ROTATOR CUFF REPAIR  04-11-2008   LEFT SHOULDER  . SPINE SURGERY    . THYROID GOITER REMOVED  1970'S  . TONSILLECTOMY  CHILD  . VAGINAL HYSTERECTOMY/ ANTERIOR & POSTERIOR REPAIR/ TRANSOBTURATOR [facility]   04-25-2005   CYSTOCELE/ RECTOCELE/ UTERINE PROLAPSE/ SUI    SOCIAL HISTORY: Social History   Socioeconomic History  . Marital status: Divorced    Spouse name: Not on file  . Number of children: Not on file  . Years of education: Not on file  . Highest education level: Not on file  Social Needs  . Financial resource strain: Not on file  . Food insecurity - worry: Not on file  . Food insecurity - inability: Not on file  . Transportation needs - medical: Not on file  . Transportation needs - non-medical: Not on file  Occupational History  . Not on file  Tobacco Use  . Smoking status: Former Smoker    Years: 40.00    Types: Cigarettes    Last attempt to quit: 06/04/1991    Years since quitting: 26.4  . Smokeless tobacco: Never Used  Substance and Sexual Activity  . Alcohol use: No  . Drug use: No  . Sexual activity: Not on file  Other Topics Concern  . Not on file  Social History Narrative  . Not on file    FAMILY HISTORY: Family History  Problem Relation Age of Onset  . Deep vein thrombosis Mother        Right Leg  . Heart disease Mother        Before age 61-  CHF  . Varicose Veins Mother   . Heart attack Mother   . Heart disease Father        Before age 20-  PVD  . Heart attack Father   . Arthritis Father        Gout  . Hypertension Father   . Cancer Sister        Thyroid-Back  . Cancer Sister        Breast and Lung  . Heart disease Sister   . Heart disease Sister     ALLERGIES:  is allergic to latex; codeine; contrast media [iodinated diagnostic agents]; indomethacin; and adhesive [tape].  MEDICATIONS:  Current Outpatient Medications  Medication Sig Dispense Refill  . acetaminophen (TYLENOL) 500 MG tablet Take 1,000 mg by mouth every 8 (eight) hours as needed for mild pain.    . B-D TB SYRINGE 1CC/25GX5/8" 25G X 5/8" 1 ML MISC USE AS DIRECTED WITH B12. 5 each 0  . cyanocobalamin (,VITAMIN B-12,) 1000 MCG/ML injection 1 injection every 4 weeks.  (Patient taking differently: Inject 1,000 mcg into the skin every 30 (thirty) days. 1 injection every 4 weeks.) 20 mL 0  . diltiazem (CARDIZEM CD) 120 MG 24 hr capsule TAKE ONE CAPSULE BY MOUTH EVERY DAY    . Multiple Vitamin (MULTIVITAMIN WITH MINERALS) TABS tablet Take 1 tablet by mouth daily. 30 tablet 0  .  potassium chloride SA (K-DUR,KLOR-CON) 20 MEQ tablet Take 1 tablet (20 mEq total) by mouth once. (Patient taking differently: Take 20 mEq by mouth daily. ) 30 tablet 3  . SYNTHROID 100 MCG tablet Take 100 mcg by mouth daily.  4  . Syringe/Needle, Disp, (SYRINGE 3CC/22GX1") 22G X 1" 3 ML MISC 1 Syringe by Does not apply route once. Please dispense syringe/needle for b12 injections. Pt will self administer her own injections. 50 each 4  . brompheniramine-pseudoephedrine-dextromethorphan (DIMETAPP DM) 15-1-5 MG/5ML ELIX Take 5 mLs by mouth every 4 (four) hours as needed (for cough for 2 days).    . naproxen (NAPROSYN) 500 MG tablet Take 500 mg by mouth daily.     No current facility-administered medications for this visit.       Marland Kitchen  PHYSICAL EXAMINATION:   Vitals:   11/20/17 1056 11/20/17 1059  BP:  (!) 166/109  Pulse:  84  Resp: 16   Temp:  98.1 F (36.7 C)   Filed Weights   11/20/17 1056  Weight: 193 lb 6.4 oz (87.7 kg)    GENERAL: Well-nourished well-developed; Alert, no distress and comfortable.  She is walking with a cane. EYES:No pallor. No icterus.  OROPHARYNX: no thrush or ulceration. NECK: supple, no masses felt LYMPH:  no palpable lymphadenopathy in the cervical, axillary or inguinal regions LUNGS: clear to auscultation and  No wheeze or crackles HEART/CVS: regular rate & rhythm and no murmurs; No lower extremity edema ABDOMEN: abdomen soft, non-tender and normal bowel sounds Musculoskeletal:no cyanosis of digits and no clubbing  PSYCH: alert & oriented x 3 with fluent speech NEURO: no focal motor/sensory deficits SKIN:  no rashes or significant  lesions  LABORATORY DATA:  I have reviewed the data as listed Lab Results  Component Value Date   WBC 10.1 11/16/2017   HGB 13.7 11/16/2017   HCT 41.1 11/16/2017   MCV 90.7 11/16/2017   PLT 235 11/16/2017   Recent Labs    11/16/17 0827  K 4.8   IMPRESSION: 1. Stable 7 mm nodule in the lingula. Given stability, repeat CT 18-24 months (from today's scan) is considered optional for low-risk patients, but is recommended for high-risk patients. This recommendation follows the consensus statement: Guidelines for Management of Incidental Pulmonary Nodules Detected on CT Images: From the Fleischner Society 2017; Radiology 2017; 284:228-243. 2. Emphysema. 3. Coronary artery and thoracic aortic atherosclerosis.   Electronically Signed   By: Misty Stanley M.D.   On: 11/18/2016 13:22  IMPRESSION: Stable 7 mm pulmonary nodule in lingula, and 4 mm pulmonary nodule in right middle lobe. Recommend 1 more additional follow-up chest CT in 12 months to confirm continued stability. This recommendation follows the consensus statement: Guidelines for Management of Small Pulmonary Nodules Detected on CT Images: From the Fleischner Society 2017; Radiology 2017; 284:228-243.  Small hiatal hernia.  Aortic Atherosclerosis (ICD10-I70.0) and Emphysema (ICD10-J43.9). Coronary artery calcification.   Electronically Signed   By: Earle Gell M.D.   On: 11/13/2017 16:33   ASSESSMENT & PLAN:  Vitamin B12 deficiency due to intestinal malabsorption # B12 deficiency anemia- autoimmune related [positive antiparietal antibodies/intrinsic antibodies]; history of vitiligo; hypothyroidism. Continue B12 IM on a monthly basis. Hb 13.7 today. Continue B12 injections.   # Left lower lobe lung nodule 7 mm- STABLE; reviewed the Nov 30th 2018-CT scan; follow up scan in 12 months.  # acute bronchitis- dimetap [costly]; defer to talking pharmacy re: alternative; s/p   # Elevated blood pressure systolic  644/034V; discuss at length  with the patient to make a log of her blood pressures.  Again re-iterated the importance of follow up with PCP.   # Follow-up with me in 12 months with labs ; CT ordered; 1 week prior.   # I reviewed the blood work- with the patient in detail; also reviewed the imaging independently [as summarized above]; and with the patient in detail.   # 25 minutes face-to-face with the patient discussing the above plan of care; more than 50% of time spent on prognosis/ natural history; counseling and coordination.  # I reviewed the blood work- with the patient in detail; also reviewed the imaging independently [as summarized above]; and with the patient in detail.    Cammie Sickle, MD 11/20/2017 12:49 PM

## 2017-11-25 DIAGNOSIS — M7581 Other shoulder lesions, right shoulder: Secondary | ICD-10-CM | POA: Diagnosis not present

## 2017-11-25 DIAGNOSIS — M1712 Unilateral primary osteoarthritis, left knee: Secondary | ICD-10-CM | POA: Diagnosis not present

## 2017-11-25 DIAGNOSIS — M25512 Pain in left shoulder: Secondary | ICD-10-CM | POA: Diagnosis not present

## 2017-11-25 DIAGNOSIS — S83232S Complex tear of medial meniscus, current injury, left knee, sequela: Secondary | ICD-10-CM | POA: Diagnosis not present

## 2017-11-25 DIAGNOSIS — S46211A Strain of muscle, fascia and tendon of other parts of biceps, right arm, initial encounter: Secondary | ICD-10-CM | POA: Diagnosis not present

## 2017-11-25 MED ORDER — CEFAZOLIN SODIUM-DEXTROSE 2-4 GM/100ML-% IV SOLN
2.0000 g | Freq: Once | INTRAVENOUS | Status: AC
  Administered 2017-11-26: 2 g via INTRAVENOUS

## 2017-11-26 ENCOUNTER — Ambulatory Visit
Admission: RE | Admit: 2017-11-26 | Discharge: 2017-11-26 | Disposition: A | Discharge status: Home / Self Care | Payer: Medicare Other | Source: Ambulatory Visit | Attending: Surgery | Admitting: Surgery

## 2017-11-26 ENCOUNTER — Ambulatory Visit: Payer: Medicare Other | Admitting: Certified Registered Nurse Anesthetist

## 2017-11-26 ENCOUNTER — Other Ambulatory Visit: Payer: Self-pay | Admitting: Student

## 2017-11-26 ENCOUNTER — Encounter
Admission: RE | Disposition: A | Discharge status: Home / Self Care | Payer: Self-pay | Source: Ambulatory Visit | Attending: Surgery

## 2017-11-26 ENCOUNTER — Other Ambulatory Visit: Payer: Self-pay

## 2017-11-26 DIAGNOSIS — Y929 Unspecified place or not applicable: Secondary | ICD-10-CM | POA: Diagnosis not present

## 2017-11-26 DIAGNOSIS — S83232A Complex tear of medial meniscus, current injury, left knee, initial encounter: Secondary | ICD-10-CM | POA: Insufficient documentation

## 2017-11-26 DIAGNOSIS — E039 Hypothyroidism, unspecified: Secondary | ICD-10-CM | POA: Insufficient documentation

## 2017-11-26 DIAGNOSIS — Z79899 Other long term (current) drug therapy: Secondary | ICD-10-CM | POA: Insufficient documentation

## 2017-11-26 DIAGNOSIS — X58XXXA Exposure to other specified factors, initial encounter: Secondary | ICD-10-CM | POA: Diagnosis not present

## 2017-11-26 DIAGNOSIS — F329 Major depressive disorder, single episode, unspecified: Secondary | ICD-10-CM | POA: Insufficient documentation

## 2017-11-26 DIAGNOSIS — Z87891 Personal history of nicotine dependence: Secondary | ICD-10-CM | POA: Diagnosis not present

## 2017-11-26 DIAGNOSIS — M7581 Other shoulder lesions, right shoulder: Secondary | ICD-10-CM

## 2017-11-26 DIAGNOSIS — K219 Gastro-esophageal reflux disease without esophagitis: Secondary | ICD-10-CM | POA: Diagnosis not present

## 2017-11-26 DIAGNOSIS — J449 Chronic obstructive pulmonary disease, unspecified: Secondary | ICD-10-CM | POA: Insufficient documentation

## 2017-11-26 DIAGNOSIS — S83272A Complex tear of lateral meniscus, current injury, left knee, initial encounter: Secondary | ICD-10-CM | POA: Diagnosis not present

## 2017-11-26 DIAGNOSIS — S46211A Strain of muscle, fascia and tendon of other parts of biceps, right arm, initial encounter: Secondary | ICD-10-CM

## 2017-11-26 DIAGNOSIS — M1712 Unilateral primary osteoarthritis, left knee: Secondary | ICD-10-CM | POA: Diagnosis not present

## 2017-11-26 DIAGNOSIS — S83282A Other tear of lateral meniscus, current injury, left knee, initial encounter: Secondary | ICD-10-CM | POA: Insufficient documentation

## 2017-11-26 DIAGNOSIS — I739 Peripheral vascular disease, unspecified: Secondary | ICD-10-CM | POA: Diagnosis not present

## 2017-11-26 DIAGNOSIS — S83242A Other tear of medial meniscus, current injury, left knee, initial encounter: Secondary | ICD-10-CM | POA: Diagnosis not present

## 2017-11-26 HISTORY — PX: KNEE ARTHROSCOPY WITH MEDIAL MENISECTOMY: SHX5651

## 2017-11-26 SURGERY — ARTHROSCOPY, KNEE, WITH MEDIAL MENISCECTOMY
Anesthesia: General | Laterality: Left

## 2017-11-26 MED ORDER — FAMOTIDINE 20 MG PO TABS
ORAL_TABLET | ORAL | Status: AC
  Administered 2017-11-26: 20 mg via ORAL
  Filled 2017-11-26: qty 1

## 2017-11-26 MED ORDER — TRAMADOL HCL 50 MG PO TABS: 50.0000 mg | ORAL_TABLET | Freq: Four times a day (QID) | ORAL | 0 refills | Status: DC | PRN

## 2017-11-26 MED ORDER — MIDAZOLAM HCL 2 MG/2ML IJ SOLN
INTRAMUSCULAR | Status: DC | PRN
  Administered 2017-11-26: 1 mg via INTRAVENOUS

## 2017-11-26 MED ORDER — ONDANSETRON HCL 4 MG/2ML IJ SOLN
INTRAMUSCULAR | Status: AC
  Filled 2017-11-26: qty 2

## 2017-11-26 MED ORDER — SUGAMMADEX SODIUM 200 MG/2ML IV SOLN
INTRAVENOUS | Status: AC
  Filled 2017-11-26: qty 2

## 2017-11-26 MED ORDER — METOCLOPRAMIDE HCL 10 MG PO TABS: 5.0000 mg | ORAL_TABLET | Freq: Three times a day (TID) | ORAL | Status: DC | PRN

## 2017-11-26 MED ORDER — POTASSIUM CHLORIDE IN NACL 20-0.9 MEQ/L-% IV SOLN
INTRAVENOUS | Status: DC
  Filled 2017-11-26: qty 1000

## 2017-11-26 MED ORDER — PROPOFOL 10 MG/ML IV BOLUS
INTRAVENOUS | Status: AC
  Filled 2017-11-26: qty 20

## 2017-11-26 MED ORDER — OXYCODONE HCL 5 MG PO TABS: 5.0000 mg | ORAL_TABLET | Freq: Once | ORAL | Status: DC | PRN

## 2017-11-26 MED ORDER — BUPIVACAINE-EPINEPHRINE (PF) 0.5% -1:200000 IJ SOLN
INTRAMUSCULAR | Status: DC | PRN
  Administered 2017-11-26: 30 mL via PERINEURAL

## 2017-11-26 MED ORDER — LIDOCAINE HCL (CARDIAC) 20 MG/ML IV SOLN
INTRAVENOUS | Status: DC | PRN
  Administered 2017-11-26: 60 mg via INTRAVENOUS

## 2017-11-26 MED ORDER — METOCLOPRAMIDE HCL 5 MG/ML IJ SOLN: 5.0000 mg | Freq: Three times a day (TID) | INTRAMUSCULAR | Status: DC | PRN

## 2017-11-26 MED ORDER — LACTATED RINGERS IV SOLN
INTRAVENOUS | Status: DC
  Administered 2017-11-26: 12:00:00 via INTRAVENOUS

## 2017-11-26 MED ORDER — LIDOCAINE HCL 1 % IJ SOLN
INTRAMUSCULAR | Status: DC | PRN
  Administered 2017-11-26: 60 mL via INTRAMUSCULAR

## 2017-11-26 MED ORDER — OXYCODONE HCL 5 MG/5ML PO SOLN: 5.0000 mg | Freq: Once | ORAL | Status: DC | PRN

## 2017-11-26 MED ORDER — SUGAMMADEX SODIUM 200 MG/2ML IV SOLN
INTRAVENOUS | Status: DC | PRN
  Administered 2017-11-26: 200 mg via INTRAVENOUS

## 2017-11-26 MED ORDER — FENTANYL CITRATE (PF) 100 MCG/2ML IJ SOLN
INTRAMUSCULAR | Status: DC | PRN
  Administered 2017-11-26 (×2): 50 ug via INTRAVENOUS

## 2017-11-26 MED ORDER — DEXAMETHASONE SODIUM PHOSPHATE 10 MG/ML IJ SOLN
INTRAMUSCULAR | Status: AC
  Filled 2017-11-26: qty 1

## 2017-11-26 MED ORDER — MIDAZOLAM HCL 2 MG/2ML IJ SOLN
INTRAMUSCULAR | Status: AC
  Filled 2017-11-26: qty 2

## 2017-11-26 MED ORDER — CEFAZOLIN SODIUM-DEXTROSE 2-4 GM/100ML-% IV SOLN
INTRAVENOUS | Status: AC
Start: 2017-11-26 — End: 2017-11-26
  Filled 2017-11-26: qty 100

## 2017-11-26 MED ORDER — PROPOFOL 10 MG/ML IV BOLUS
INTRAVENOUS | Status: DC | PRN
  Administered 2017-11-26: 110 mg via INTRAVENOUS

## 2017-11-26 MED ORDER — GLYCOPYRROLATE 0.2 MG/ML IJ SOLN
INTRAMUSCULAR | Status: DC | PRN
  Administered 2017-11-26: 0.2 mg via INTRAVENOUS

## 2017-11-26 MED ORDER — ONDANSETRON HCL 4 MG/2ML IJ SOLN: 4.0000 mg | Freq: Four times a day (QID) | INTRAMUSCULAR | Status: DC | PRN

## 2017-11-26 MED ORDER — ROCURONIUM BROMIDE 100 MG/10ML IV SOLN
INTRAVENOUS | Status: DC | PRN
  Administered 2017-11-26: 40 mg via INTRAVENOUS

## 2017-11-26 MED ORDER — ONDANSETRON HCL 4 MG PO TABS: 4.0000 mg | ORAL_TABLET | Freq: Four times a day (QID) | ORAL | Status: DC | PRN

## 2017-11-26 MED ORDER — DEXAMETHASONE SODIUM PHOSPHATE 10 MG/ML IJ SOLN
INTRAMUSCULAR | Status: DC | PRN
  Administered 2017-11-26: 10 mg via INTRAVENOUS

## 2017-11-26 MED ORDER — LIDOCAINE HCL 1 % IJ SOLN: INTRAMUSCULAR | Status: DC | PRN

## 2017-11-26 MED ORDER — LIDOCAINE HCL (PF) 2 % IJ SOLN
INTRAMUSCULAR | Status: AC
  Filled 2017-11-26: qty 10

## 2017-11-26 MED ORDER — FENTANYL CITRATE (PF) 100 MCG/2ML IJ SOLN: 25.0000 ug | INTRAMUSCULAR | Status: DC | PRN

## 2017-11-26 MED ORDER — PHENYLEPHRINE HCL 10 MG/ML IJ SOLN
INTRAMUSCULAR | Status: DC | PRN
  Administered 2017-11-26 (×3): 100 ug via INTRAVENOUS

## 2017-11-26 MED ORDER — FAMOTIDINE 20 MG PO TABS
20.0000 mg | ORAL_TABLET | Freq: Once | ORAL | Status: AC
  Administered 2017-11-26: 20 mg via ORAL

## 2017-11-26 MED ORDER — SCOPOLAMINE 1 MG/3DAYS TD PT72
MEDICATED_PATCH | TRANSDERMAL | Status: AC
  Administered 2017-11-26: 1.5 mg via TRANSDERMAL
  Filled 2017-11-26: qty 1

## 2017-11-26 MED ORDER — SCOPOLAMINE 1 MG/3DAYS TD PT72
1.0000 | MEDICATED_PATCH | TRANSDERMAL | Status: DC
  Administered 2017-11-26: 1.5 mg via TRANSDERMAL

## 2017-11-26 MED ORDER — ONDANSETRON HCL 4 MG/2ML IJ SOLN
INTRAMUSCULAR | Status: DC | PRN
  Administered 2017-11-26: 4 mg via INTRAVENOUS

## 2017-11-26 MED ORDER — FENTANYL CITRATE (PF) 100 MCG/2ML IJ SOLN
INTRAMUSCULAR | Status: AC
  Filled 2017-11-26: qty 2

## 2017-11-26 SURGICAL SUPPLY — 30 items
BANDAGE ACE 6X5 VEL STRL LF (GAUZE/BANDAGES/DRESSINGS) ×2 IMPLANT
BLADE FULL RADIUS 3.5 (BLADE) ×2 IMPLANT
BLADE SHAVER 4.5X7 STR FR (MISCELLANEOUS) IMPLANT
CHLORAPREP W/TINT 26ML (MISCELLANEOUS) ×2 IMPLANT
CUFF TOURN 30 STER DUAL PORT (MISCELLANEOUS) ×2 IMPLANT
DRAPE IMP U-DRAPE 54X76 (DRAPES) ×2 IMPLANT
ELECT REM PT RETURN 9FT ADLT (ELECTROSURGICAL) ×2
ELECTRODE REM PT RTRN 9FT ADLT (ELECTROSURGICAL) ×1 IMPLANT
GAUZE SPONGE 4X4 12PLY STRL (GAUZE/BANDAGES/DRESSINGS) ×2 IMPLANT
GLOVE BIO SURGEON STRL SZ8 (GLOVE) ×4 IMPLANT
GLOVE BIOGEL M 7.0 STRL (GLOVE) ×4 IMPLANT
GLOVE BIOGEL PI IND STRL 7.5 (GLOVE) ×1 IMPLANT
GLOVE BIOGEL PI INDICATOR 7.5 (GLOVE) ×1
GLOVE INDICATOR 8.0 STRL GRN (GLOVE) ×2 IMPLANT
GOWN STRL REUS W/ TWL LRG LVL3 (GOWN DISPOSABLE) ×1 IMPLANT
GOWN STRL REUS W/ TWL XL LVL3 (GOWN DISPOSABLE) ×2 IMPLANT
GOWN STRL REUS W/TWL LRG LVL3 (GOWN DISPOSABLE) ×1
GOWN STRL REUS W/TWL XL LVL3 (GOWN DISPOSABLE) ×2
IV LACTATED RINGER IRRG 3000ML (IV SOLUTION) ×1
IV LR IRRIG 3000ML ARTHROMATIC (IV SOLUTION) ×1 IMPLANT
KIT RM TURNOVER STRD PROC AR (KITS) ×2 IMPLANT
MANIFOLD NEPTUNE II (INSTRUMENTS) ×2 IMPLANT
NEEDLE HYPO 21X1.5 SAFETY (NEEDLE) ×2 IMPLANT
PACK ARTHROSCOPY KNEE (MISCELLANEOUS) ×2 IMPLANT
PENCIL ELECTRO HAND CTR (MISCELLANEOUS) ×2 IMPLANT
SUT PROLENE 4 0 PS 2 18 (SUTURE) ×2 IMPLANT
SUT TICRON COATED BLUE 2 0 30 (SUTURE) IMPLANT
SYR 50ML LL SCALE MARK (SYRINGE) ×2 IMPLANT
TUBING ARTHRO INFLOW-ONLY STRL (TUBING) ×2 IMPLANT
WAND HAND CNTRL MULTIVAC 90 (MISCELLANEOUS) ×2 IMPLANT

## 2017-11-26 NOTE — H&P (Signed)
Paper H&P to be scanned into permanent record. H&P reviewed and patient re-examined. No changes. 

## 2017-11-26 NOTE — Anesthesia Procedure Notes (Signed)
Performed by: Naiomy Watters, CRNA       

## 2017-11-26 NOTE — Anesthesia Preprocedure Evaluation (Addendum)
Anesthesia Evaluation  Patient identified by MRN, date of birth, ID band Patient awake    Reviewed: Allergy & Precautions, H&P , NPO status , Patient's Chart, lab work & pertinent test results  History of Anesthesia Complications (+) PONVNegative for: history of anesthetic complications  Airway Mallampati: III  TM Distance: >3 FB Neck ROM: full    Dental  (+) Chipped, Poor Dentition, Missing, Loose   Pulmonary shortness of breath and with exertion, COPD, former smoker,           Cardiovascular Exercise Tolerance: Good (-) angina+ Peripheral Vascular Disease  (-) Past MI and (-) DOE + dysrhythmias Atrial Fibrillation + Valvular Problems/Murmurs      Neuro/Psych negative neurological ROS  negative psych ROS   GI/Hepatic Neg liver ROS, hiatal hernia, GERD  ,  Endo/Other  Hypothyroidism   Renal/GU      Musculoskeletal  (+) Arthritis ,   Abdominal   Peds  Hematology negative hematology ROS (+)   Anesthesia Other Findings Red injection in left eye.  Bruising left biceps  Signs and symptoms suggestive of sleep apnea   Past Medical History: No date: Anemia  : Arthritis     Comment:  Back, shoulders, arms LUMBAR: Chronic low back pain     Comment:  RELEIVE W/ TENS UNIT PRN AND CHRIOPACTOR No date: Chronic venous insufficiency No date: Dyspnea No date: Dysrhythmia     Comment:  A Fib during hospital stay 04/03/16 LEFT RING FINGER: Ganglion cyst No date: H/O hiatal hernia BILATEARL LEGS AND HEELS DUE TO TENDINITIS AND SPURS: H/O tenderness  in limb ASYMPTOMATIC: Heart murmur     Comment:  s/p rheumatic fever CHILDHOOD: History of rheumatic fever No date: Hypothyroidism No date: Left hand weakness No date: Mild acid reflux No date: Motion sickness     Comment:  cars BILATERAL ARMS SECONDARY TO CERVICAL PINCHED NERVE: Numbness and  tingling No date: Peripheral vascular disease (HCC)     Comment:  legs.  No  date: Pinched nerve in neck No date: Short of breath on exertion No date: Snores No date: Urge urinary incontinence No date: Varicose veins     Comment:  Left Leg  Past Surgical History: No date: ABDOMINAL HYSTERECTOMY APRIL 2009: CARDIOVASCULAR STRESS TEST     Comment:  NORMAL LVSF/ EF 61%/ NO ISCHEMIA 04/17/2016: COLONOSCOPY WITH PROPOFOL; N/A     Comment:  Procedure: COLONOSCOPY WITH PROPOFOL;  Surgeon: Lucilla Lame, MD;  Location: [place];  Service:               Endoscopy;  Laterality: N/A; No date: DILATION AND CURETTAGE OF UTERUS 04/04/2016: ESOPHAGOGASTRODUODENOSCOPY (EGD) WITH PROPOFOL; N/A     Comment:  Procedure: ESOPHAGOGASTRODUODENOSCOPY (EGD) WITH               PROPOFOL;  Surgeon: Lucilla Lame, MD;  Location: [facility]               ENDOSCOPY;  Service: Endoscopy;  Laterality: N/A; 06/09/2012: GANGLION CYST EXCISION     Comment:  Procedure: REMOVAL GANGLION OF WRIST;  Surgeon: Magnus Sinning, MD;  Location: [place];                Service: Orthopedics;  Laterality: Left;  EXCISION OF  GANGLION CYST LEFT RING FINGER  IV REGIONAL ANESTHESIA 1965: HEMRROIDECTOMY AND ANAL FISSURE REPAIR 05-13-2002: LAPAROSCOPIC CHOLECYSTECTOMY     Comment:  Gall Bladder 10-11-2009: LEFT BREAST DUCTAL EXCISION     Comment:  BENIGN 1980'S: LEFT HAND SURG.     Comment:  EXTENSIVE REPAIR AND REMOVAL OF LIGAMENT/TENDON                (?CANCER) 1990  &  1976: LUMBAR LAMINECTOMY     Comment:  RIGHT SIDE L4 - 5  & L5 - S1 04/17/2016: POLYPECTOMY     Comment:  Procedure: POLYPECTOMY;  Surgeon: Lucilla Lame, MD;                Location: [place];  Service: Endoscopy;; 1980'S: REMOVAL RIGHT GOIN MASS     Comment:  BENIGN AGE 71: REMOVAL TOE NAILBED     Comment:  ALL 10 TOES-- DEFORMED No date: RIGHT BREAST LUMPECTOMY     Comment:  BENIGN X3: RIGHT FOOT SURG     Comment:  REPAIR OF 2 TOES 04-11-2008: ROTATOR CUFF  REPAIR     Comment:  LEFT SHOULDER No date: SPINE SURGERY 1970'S: THYROID GOITER REMOVED CHILD: TONSILLECTOMY 04-25-2005: VAGINAL HYSTERECTOMY/ ANTERIOR & POSTERIOR REPAIR/  TRANSOBTURATOR [place]     Comment:  CYSTOCELE/ RECTOCELE/ UTERINE PROLAPSE/ SUI  BMI    Body Mass Index:  31.15 kg/m      Reproductive/Obstetrics negative OB ROS                           Anesthesia Physical Anesthesia Plan  ASA: III  Anesthesia Plan: General and General ETT   Post-op Pain Management:    Induction: Intravenous  PONV Risk Score and Plan: 4 or greater and Ondansetron, Midazolam, Dexamethasone and Scopolamine patch - Pre-op  Airway Management Planned: Oral ETT  Additional Equipment:   Intra-op Plan:   Post-operative Plan: Extubation in OR  Informed Consent: I have reviewed the patients History and Physical, chart, labs and discussed the procedure including the risks, benefits and alternatives for the proposed anesthesia with the patient or authorized representative who has indicated his/her understanding and acceptance.   Dental Advisory Given  Plan Discussed with: Anesthesiologist, CRNA and Surgeon  Anesthesia Plan Comments: (Patient consented for risks of anesthesia including but not limited to:  - adverse reactions to medications - damage to teeth, lips or other oral mucosa - sore throat or hoarseness - Damage to heart, brain, lungs or loss of life  Patient voiced understanding.)       Anesthesia Quick Evaluation

## 2017-11-26 NOTE — Discharge Instructions (Addendum)
AMBULATORY SURGERY  DISCHARGE INSTRUCTIONS   1) The drugs that you were given will stay in your system until tomorrow so for the next 24 hours you should not:  A) Drive an automobile B) Make any legal decisions C) Drink any alcoholic beverage   2) You may resume regular meals tomorrow.  Today it is better to start with liquids and gradually work up to solid foods.  You may eat anything you prefer, but it is better to start with liquids, then soup and crackers, and gradually work up to solid foods.   3) Please notify your doctor immediately if you have any unusual bleeding, trouble breathing, redness and pain at the surgery site, drainage, fever, or pain not relieved by medication. 4)   5) Your post-operative visit with Dr.                                     is: Date:                        Time:    Please call to schedule your post-operative visit.  6) Additional Instructions:       Keep dressing dry and intact.  May shower after dressing changed on post-op day #4 (Monday).  Cover sutures with Band-Aids after drying off. Apply ice frequently to knee. Take ibuprofen 600 mg TID with meals or Naprosyn 500 mg BID for 7-10 days, then as necessary. Take pain medication as prescribed or ES Tylenol when needed.  May weight-bear as tolerated - use crutches or walker as needed. Follow-up in 10-14 days or as scheduled.

## 2017-11-26 NOTE — Anesthesia Procedure Notes (Signed)
Procedure Name: Intubation Date/Time: 11/26/2017 1:43 PM Performed by: Hedda Slade, CRNA Pre-anesthesia Checklist: Patient identified, Emergency Drugs available, Suction available, Patient being monitored and Timeout performed Patient Re-evaluated:Patient Re-evaluated prior to induction Oxygen Delivery Method: Circle system utilized Preoxygenation: Pre-oxygenation with 100% oxygen Induction Type: IV induction Ventilation: Mask ventilation without difficulty and Oral airway inserted - appropriate to patient size Laryngoscope Size: Mac and 3 Grade View: Grade I Tube type: Oral Tube size: 7.0 mm Number of attempts: 1 Airway Equipment and Method: Stylet Placement Confirmation: ETT inserted through vocal cords under direct vision,  positive ETCO2 and breath sounds checked- equal and bilateral Secured at: 20 cm Tube secured with: Tape Dental Injury: Teeth and Oropharynx as per pre-operative assessment

## 2017-11-26 NOTE — Anesthesia Postprocedure Evaluation (Signed)
Anesthesia Post Note  Patient: Sydney Ayala  Procedure(s) Performed: KNEE ARTHROSCOPY WITH MEDIAL MENISECTOMY (Left )  Patient location during evaluation: PACU Anesthesia Type: General Level of consciousness: awake and alert Pain management: pain level controlled Vital Signs Assessment: post-procedure vital signs reviewed and stable Respiratory status: spontaneous breathing, nonlabored ventilation, respiratory function stable and patient connected to nasal cannula oxygen Cardiovascular status: blood pressure returned to baseline and stable Postop Assessment: no apparent nausea or vomiting Anesthetic complications: no     Last Vitals:  Vitals:   11/26/17 1513 11/26/17 1528  BP: (!) 141/77 (!) 154/80  Pulse: 72 76  Resp: 13 13  Temp: 36.9 C   SpO2: 100% 99%    Last Pain:  Vitals:   11/26/17 1528  TempSrc:   PainSc: 0-No pain                 Precious Haws Lila L[place]kin

## 2017-11-26 NOTE — Anesthesia Post-op Follow-up Note (Signed)
Anesthesia QCDR form completed.        

## 2017-11-26 NOTE — Op Note (Signed)
11/26/2017  2:51 PM  Patient:   Sydney Ayala  Pre-Op Diagnosis:   Complex medial meniscus tear with underlying degenerative joint disease, left knee.  Postoperative diagnosis:   Complex medial meniscus tear with lateral meniscus tear and underlying degenerative joint disease, left knee.  Procedure:   Arthroscopic partial medial and lateral meniscectomies, abrasion chondroplasty of grade 3 chondromalacia changes of patella and femoral trochlea, left knee.  Surgeon:   Pascal Lux, M.D.  Anesthesia:   GET  Findings:   As above.  There were grade 1-2 chondromalacial changes involving the medial tibial plateau and grade 2 chondral malacia changes involving the weightbearing portion of the medial femoral condyle.  Laterally, the articular surfaces were in satisfactory condition.  The anterior and posterior cruciate ligaments both were in excellent condition.  Complications:   None.  EBL:   <5 cc.  Total fluids:   600 cc of crystalloid.  Tourniquet time:   None  Drains:   None  Closure:   4-0 Prolene interrupted sutures.  Brief clinical note:   The patient is a 71 year old female with a history of medial sided left knee pain. Her symptoms have persisted despite medications, activity modification, etc. Her history and examination consistent with a medial meniscus tear confirmed by MRI scan. The patient presents at this time for arthroscopy, debridement, and partial medial meniscectomy.  Procedure:   The patient was brought into the operating room and lain in the supine position. After adequate general endotracheal intubation and anesthesia was obtained, a timeout was performed to verify the appropriate side. The patient's left knee was injected sterilely using a solution of 30 cc of 1% lidocaine and 30 cc of 0.5% Sensorcaine with epinephrine. The left lower extremity was prepped with ChloraPrep solution before being draped sterilely. Preoperative antibiotics were administered. The  expected portal sites were injected with 0.5% Sensorcaine with epinephrine before the camera was placed in the anterolateral portal and instrumentation performed through the anteromedial portal. The knee was sequentially examined beginning in the suprapatellar pouch, then progressing to the patellofemoral space, the medial gutter compartment, the notch, and finally the lateral compartment and gutter. The findings were as described above. Abundant reactive synovial tissues anteriorly were debrided using the full-radius resector in order to improve visualization. The areas of tearing of the medial meniscus were debrided back to stable margins using combination of the straight and up-biting mini-munchers and the full-radius resector. Subsequent probing of the remaining rim demonstrated excellent stability. Laterally, the areas of fraying along the central portion were debrided back to stable margins using the full-radius resector. Areas of unstable articular cartilage involving the femoral trochlea and patella also were debrided using the full-radius resector. The instruments were removed from the joint after suctioning the excess fluid. The portal sites were closed using 4-0 Prolene interrupted sutures before a sterile bulky dressing was applied to the knee. The patient was then awakened, extubated, and returned to the recovery room in satisfactory condition after tolerating the procedure well.

## 2017-11-26 NOTE — Transfer of Care (Signed)
Immediate Anesthesia Transfer of Care Note  Patient: Sydney Ayala  Procedure(s) Performed: KNEE ARTHROSCOPY WITH MEDIAL MENISECTOMY (Left )  Patient Location: PACU  Anesthesia Type:General  Level of Consciousness: awake, alert  and patient cooperative  Airway & Oxygen Therapy: Patient Spontanous Breathing and Patient connected to nasal cannula oxygen  Post-op Assessment: Report given to RN and Post -op Vital signs reviewed and stable  Post vital signs: Reviewed and stable  Last Vitals:  Vitals:   11/26/17 1200  BP: (!) 160/86  Pulse: 88  Resp: 16  Temp: 37.5 C  SpO2: 99%    Last Pain:  Vitals:   11/26/17 1200  TempSrc: Temporal         Complications: No apparent anesthesia complications

## 2017-11-27 ENCOUNTER — Encounter: Payer: Self-pay | Admitting: Surgery

## 2017-11-27 DIAGNOSIS — S83272A Complex tear of lateral meniscus, current injury, left knee, initial encounter: Secondary | ICD-10-CM | POA: Insufficient documentation

## 2017-12-04 ENCOUNTER — Ambulatory Visit
Admission: RE | Admit: 2017-12-04 | Discharge: 2017-12-04 | Disposition: A | Discharge status: Home / Self Care | Payer: Medicare Other | Source: Ambulatory Visit | Attending: Student | Admitting: Student

## 2017-12-04 DIAGNOSIS — M7551 Bursitis of right shoulder: Secondary | ICD-10-CM | POA: Diagnosis not present

## 2017-12-04 DIAGNOSIS — M75101 Unspecified rotator cuff tear or rupture of right shoulder, not specified as traumatic: Secondary | ICD-10-CM | POA: Insufficient documentation

## 2017-12-04 DIAGNOSIS — M19011 Primary osteoarthritis, right shoulder: Secondary | ICD-10-CM | POA: Diagnosis not present

## 2017-12-04 DIAGNOSIS — S46211A Strain of muscle, fascia and tendon of other parts of biceps, right arm, initial encounter: Secondary | ICD-10-CM | POA: Diagnosis not present

## 2017-12-04 DIAGNOSIS — M25511 Pain in right shoulder: Secondary | ICD-10-CM | POA: Diagnosis not present

## 2017-12-04 DIAGNOSIS — X58XXXA Exposure to other specified factors, initial encounter: Secondary | ICD-10-CM | POA: Insufficient documentation

## 2017-12-04 DIAGNOSIS — M7581 Other shoulder lesions, right shoulder: Secondary | ICD-10-CM | POA: Diagnosis not present

## 2017-12-04 DIAGNOSIS — M25521 Pain in right elbow: Secondary | ICD-10-CM | POA: Diagnosis not present

## 2017-12-11 ENCOUNTER — Other Ambulatory Visit: Payer: Self-pay | Admitting: Internal Medicine

## 2017-12-11 DIAGNOSIS — R911 Solitary pulmonary nodule: Secondary | ICD-10-CM

## 2017-12-11 DIAGNOSIS — D518 Other vitamin B12 deficiency anemias: Secondary | ICD-10-CM

## 2017-12-11 DIAGNOSIS — D519 Vitamin B12 deficiency anemia, unspecified: Secondary | ICD-10-CM

## 2017-12-11 DIAGNOSIS — R17 Unspecified jaundice: Secondary | ICD-10-CM

## 2017-12-14 DIAGNOSIS — S46101A Unspecified injury of muscle, fascia and tendon of long head of biceps, right arm, initial encounter: Secondary | ICD-10-CM | POA: Insufficient documentation

## 2017-12-14 DIAGNOSIS — M75121 Complete rotator cuff tear or rupture of right shoulder, not specified as traumatic: Secondary | ICD-10-CM | POA: Insufficient documentation

## 2017-12-21 DIAGNOSIS — M62121 Other rupture of muscle (nontraumatic), right upper arm: Secondary | ICD-10-CM | POA: Diagnosis not present

## 2017-12-21 DIAGNOSIS — D510 Vitamin B12 deficiency anemia due to intrinsic factor deficiency: Secondary | ICD-10-CM | POA: Diagnosis not present

## 2017-12-21 DIAGNOSIS — I481 Persistent atrial fibrillation: Secondary | ICD-10-CM | POA: Diagnosis not present

## 2017-12-21 DIAGNOSIS — E782 Mixed hyperlipidemia: Secondary | ICD-10-CM | POA: Diagnosis not present

## 2017-12-21 DIAGNOSIS — I10 Essential (primary) hypertension: Secondary | ICD-10-CM | POA: Diagnosis not present

## 2017-12-21 DIAGNOSIS — M75111 Incomplete rotator cuff tear or rupture of right shoulder, not specified as traumatic: Secondary | ICD-10-CM | POA: Diagnosis not present

## 2017-12-21 DIAGNOSIS — M1712 Unilateral primary osteoarthritis, left knee: Secondary | ICD-10-CM | POA: Diagnosis not present

## 2017-12-21 DIAGNOSIS — E039 Hypothyroidism, unspecified: Secondary | ICD-10-CM | POA: Diagnosis not present

## 2017-12-25 DIAGNOSIS — Z23 Encounter for immunization: Secondary | ICD-10-CM | POA: Diagnosis not present

## 2017-12-29 ENCOUNTER — Other Ambulatory Visit: Payer: Self-pay | Admitting: Legal Medicine

## 2017-12-29 DIAGNOSIS — Z139 Encounter for screening, unspecified: Secondary | ICD-10-CM

## 2018-01-08 DIAGNOSIS — M5440 Lumbago with sciatica, unspecified side: Secondary | ICD-10-CM | POA: Diagnosis not present

## 2018-01-08 DIAGNOSIS — M47816 Spondylosis without myelopathy or radiculopathy, lumbar region: Secondary | ICD-10-CM | POA: Diagnosis not present

## 2018-01-15 ENCOUNTER — Ambulatory Visit
Admission: RE | Admit: 2018-01-15 | Discharge: 2018-01-15 | Disposition: A | Discharge status: Home / Self Care | Payer: Medicare Other | Source: Ambulatory Visit | Attending: Legal Medicine | Admitting: Legal Medicine

## 2018-01-15 DIAGNOSIS — Z1231 Encounter for screening mammogram for malignant neoplasm of breast: Secondary | ICD-10-CM | POA: Diagnosis not present

## 2018-01-15 DIAGNOSIS — Z139 Encounter for screening, unspecified: Secondary | ICD-10-CM

## 2018-03-07 ENCOUNTER — Other Ambulatory Visit: Payer: Self-pay | Admitting: Internal Medicine

## 2018-03-31 DIAGNOSIS — I10 Essential (primary) hypertension: Secondary | ICD-10-CM | POA: Diagnosis not present

## 2018-03-31 DIAGNOSIS — I480 Paroxysmal atrial fibrillation: Secondary | ICD-10-CM | POA: Diagnosis not present

## 2018-05-04 DIAGNOSIS — I481 Persistent atrial fibrillation: Secondary | ICD-10-CM | POA: Diagnosis not present

## 2018-05-04 DIAGNOSIS — I10 Essential (primary) hypertension: Secondary | ICD-10-CM | POA: Diagnosis not present

## 2018-05-04 DIAGNOSIS — E782 Mixed hyperlipidemia: Secondary | ICD-10-CM | POA: Diagnosis not present

## 2018-05-04 DIAGNOSIS — M1712 Unilateral primary osteoarthritis, left knee: Secondary | ICD-10-CM | POA: Diagnosis not present

## 2018-05-04 DIAGNOSIS — M62121 Other rupture of muscle (nontraumatic), right upper arm: Secondary | ICD-10-CM | POA: Diagnosis not present

## 2018-05-04 DIAGNOSIS — M75111 Incomplete rotator cuff tear or rupture of right shoulder, not specified as traumatic: Secondary | ICD-10-CM | POA: Diagnosis not present

## 2018-05-04 DIAGNOSIS — D510 Vitamin B12 deficiency anemia due to intrinsic factor deficiency: Secondary | ICD-10-CM | POA: Diagnosis not present

## 2018-05-04 DIAGNOSIS — E039 Hypothyroidism, unspecified: Secondary | ICD-10-CM | POA: Diagnosis not present

## 2018-09-01 DIAGNOSIS — I10 Essential (primary) hypertension: Secondary | ICD-10-CM | POA: Diagnosis not present

## 2018-09-01 DIAGNOSIS — E039 Hypothyroidism, unspecified: Secondary | ICD-10-CM | POA: Diagnosis not present

## 2018-09-01 DIAGNOSIS — I481 Persistent atrial fibrillation: Secondary | ICD-10-CM | POA: Diagnosis not present

## 2018-09-01 DIAGNOSIS — I700 Atherosclerosis of aorta: Secondary | ICD-10-CM | POA: Diagnosis not present

## 2018-09-01 DIAGNOSIS — D510 Vitamin B12 deficiency anemia due to intrinsic factor deficiency: Secondary | ICD-10-CM | POA: Diagnosis not present

## 2018-09-01 DIAGNOSIS — E782 Mixed hyperlipidemia: Secondary | ICD-10-CM | POA: Diagnosis not present

## 2018-09-01 DIAGNOSIS — Z6826 Body mass index (BMI) 26.0-26.9, adult: Secondary | ICD-10-CM | POA: Diagnosis not present

## 2018-09-15 DIAGNOSIS — K922 Gastrointestinal hemorrhage, unspecified: Secondary | ICD-10-CM | POA: Diagnosis not present

## 2018-09-15 DIAGNOSIS — I480 Paroxysmal atrial fibrillation: Secondary | ICD-10-CM | POA: Diagnosis not present

## 2018-09-15 DIAGNOSIS — I10 Essential (primary) hypertension: Secondary | ICD-10-CM | POA: Diagnosis not present

## 2018-10-11 DIAGNOSIS — Z23 Encounter for immunization: Secondary | ICD-10-CM | POA: Diagnosis not present

## 2018-11-19 ENCOUNTER — Ambulatory Visit: Payer: Medicare Other | Admitting: Internal Medicine

## 2018-11-22 DIAGNOSIS — Z23 Encounter for immunization: Secondary | ICD-10-CM | POA: Diagnosis not present

## 2018-11-23 ENCOUNTER — Ambulatory Visit: Payer: Medicare Other | Attending: Internal Medicine

## 2018-11-29 DIAGNOSIS — M65311 Trigger thumb, right thumb: Secondary | ICD-10-CM | POA: Diagnosis not present

## 2018-11-29 DIAGNOSIS — M65342 Trigger finger, left ring finger: Secondary | ICD-10-CM | POA: Diagnosis not present

## 2018-11-29 DIAGNOSIS — M65321 Trigger finger, right index finger: Secondary | ICD-10-CM | POA: Diagnosis not present

## 2018-11-29 DIAGNOSIS — M1712 Unilateral primary osteoarthritis, left knee: Secondary | ICD-10-CM | POA: Diagnosis not present

## 2018-11-29 DIAGNOSIS — M65312 Trigger thumb, left thumb: Secondary | ICD-10-CM | POA: Diagnosis not present

## 2018-11-30 ENCOUNTER — Telehealth: Payer: Self-pay | Admitting: *Deleted

## 2018-11-30 NOTE — Telephone Encounter (Signed)
Sydney Ayala, please r/s apts and ct scan

## 2018-11-30 NOTE — Telephone Encounter (Signed)
Patient did not have ct scan of chest scheduled. I spoke with patient she would like to have lab/md/ct scan rescheduled. Per md - Ok to have apts pushed out by a month if needed. Colette, please contact patient to r/s apts. pls contact patient's cell phone as pt no longer has landline. [phone]).

## 2018-12-01 ENCOUNTER — Inpatient Hospital Stay: Payer: Medicare Other | Admitting: Internal Medicine

## 2018-12-01 ENCOUNTER — Inpatient Hospital Stay: Payer: Medicare Other

## 2018-12-01 DIAGNOSIS — M1712 Unilateral primary osteoarthritis, left knee: Secondary | ICD-10-CM | POA: Diagnosis not present

## 2018-12-01 DIAGNOSIS — R51 Headache: Secondary | ICD-10-CM | POA: Diagnosis not present

## 2018-12-01 DIAGNOSIS — Z6824 Body mass index (BMI) 24.0-24.9, adult: Secondary | ICD-10-CM | POA: Diagnosis not present

## 2018-12-27 ENCOUNTER — Ambulatory Visit
Admission: RE | Admit: 2018-12-27 | Discharge: 2018-12-27 | Disposition: A | Discharge status: Home / Self Care | Payer: Medicare Other | Source: Ambulatory Visit | Attending: Internal Medicine | Admitting: Internal Medicine

## 2018-12-27 DIAGNOSIS — E538 Deficiency of other specified B group vitamins: Secondary | ICD-10-CM | POA: Insufficient documentation

## 2018-12-27 DIAGNOSIS — R911 Solitary pulmonary nodule: Secondary | ICD-10-CM

## 2018-12-27 DIAGNOSIS — K909 Intestinal malabsorption, unspecified: Secondary | ICD-10-CM | POA: Insufficient documentation

## 2018-12-27 DIAGNOSIS — R918 Other nonspecific abnormal finding of lung field: Secondary | ICD-10-CM | POA: Diagnosis not present

## 2019-01-03 ENCOUNTER — Inpatient Hospital Stay: Payer: Medicare Other | Attending: Internal Medicine

## 2019-01-03 ENCOUNTER — Inpatient Hospital Stay (HOSPITAL_BASED_OUTPATIENT_CLINIC_OR_DEPARTMENT_OTHER): Payer: Medicare Other | Admitting: Internal Medicine

## 2019-01-03 VITALS — BP 157/75 | HR 58 | Temp 97.4°F | Resp 16 | Wt 155.9 lb

## 2019-01-03 DIAGNOSIS — K909 Intestinal malabsorption, unspecified: Secondary | ICD-10-CM | POA: Insufficient documentation

## 2019-01-03 DIAGNOSIS — E538 Deficiency of other specified B group vitamins: Secondary | ICD-10-CM

## 2019-01-03 DIAGNOSIS — R918 Other nonspecific abnormal finding of lung field: Secondary | ICD-10-CM

## 2019-01-03 DIAGNOSIS — R911 Solitary pulmonary nodule: Secondary | ICD-10-CM

## 2019-01-03 DIAGNOSIS — Z79899 Other long term (current) drug therapy: Secondary | ICD-10-CM

## 2019-01-03 DIAGNOSIS — Z87891 Personal history of nicotine dependence: Secondary | ICD-10-CM | POA: Diagnosis not present

## 2019-01-03 DIAGNOSIS — R030 Elevated blood-pressure reading, without diagnosis of hypertension: Secondary | ICD-10-CM | POA: Diagnosis not present

## 2019-01-03 DIAGNOSIS — D518 Other vitamin B12 deficiency anemias: Secondary | ICD-10-CM

## 2019-01-03 DIAGNOSIS — D519 Vitamin B12 deficiency anemia, unspecified: Secondary | ICD-10-CM | POA: Insufficient documentation

## 2019-01-03 DIAGNOSIS — R17 Unspecified jaundice: Secondary | ICD-10-CM

## 2019-01-03 LAB — CBC WITH DIFFERENTIAL/PLATELET
Abs Immature Granulocytes: 0.01 10*3/uL (ref 0.00–0.07)
BASOS ABS: 0 10*3/uL (ref 0.0–0.1)
Basophils Relative: 0 %
EOS ABS: 0.1 10*3/uL (ref 0.0–0.5)
EOS PCT: 2 %
HCT: 44.2 % (ref 36.0–46.0)
Hemoglobin: 14.1 g/dL (ref 12.0–15.0)
Immature Granulocytes: 0 %
Lymphocytes Relative: 31 %
Lymphs Abs: 1.9 10*3/uL (ref 0.7–4.0)
MCH: 29.1 pg (ref 26.0–34.0)
MCHC: 31.9 g/dL (ref 30.0–36.0)
MCV: 91.3 fL (ref 80.0–100.0)
Monocytes Absolute: 0.6 10*3/uL (ref 0.1–1.0)
Monocytes Relative: 10 %
NRBC: 0 % (ref 0.0–0.2)
Neutro Abs: 3.6 10*3/uL (ref 1.7–7.7)
Neutrophils Relative %: 57 %
Platelets: 239 10*3/uL (ref 150–400)
RBC: 4.84 MIL/uL (ref 3.87–5.11)
RDW: 13.4 % (ref 11.5–15.5)
WBC: 6.2 10*3/uL (ref 4.0–10.5)

## 2019-01-03 LAB — COMPREHENSIVE METABOLIC PANEL
ALT: 17 U/L (ref 0–44)
ANION GAP: 10 (ref 5–15)
AST: 21 U/L (ref 15–41)
Albumin: 4.4 g/dL (ref 3.5–5.0)
Alkaline Phosphatase: 86 U/L (ref 38–126)
BUN: 25 mg/dL — ABNORMAL HIGH (ref 8–23)
CALCIUM: 9.4 mg/dL (ref 8.9–10.3)
CHLORIDE: 104 mmol/L (ref 98–111)
CO2: 27 mmol/L (ref 22–32)
Creatinine, Ser: 0.74 mg/dL (ref 0.44–1.00)
Glucose, Bld: 94 mg/dL (ref 70–99)
Potassium: 4.4 mmol/L (ref 3.5–5.1)
Sodium: 141 mmol/L (ref 135–145)
Total Bilirubin: 0.8 mg/dL (ref 0.3–1.2)
Total Protein: 7.5 g/dL (ref 6.5–8.1)

## 2019-01-03 LAB — VITAMIN B12: VITAMIN B 12: 423 pg/mL (ref 180–914)

## 2019-01-03 MED ORDER — "TUBERCULIN SYRINGE 25G X 5/8"" 1 ML MISC": 0 refills | Status: DC

## 2019-01-03 MED ORDER — CYANOCOBALAMIN 1000 MCG/ML IJ SOLN
INTRAMUSCULAR | 0 refills | Status: DC
Start: 2019-01-03 — End: 2020-01-24

## 2019-01-03 NOTE — Assessment & Plan Note (Addendum)
#   B12 deficiency anemia- autoimmune related on B12 IM on a monthly basis.  Hemoglobin normal 13-14.  Stable.  #Continue B12 injections monthly basis at home/rest of life.  No further recommendations from hematology standpoint.  Patient will continue follow-up with PCP for refills/follow-up.  # Left lower lobe lung nodule 7 mm- STABLE; reviewed the Jan 2020- STABLE; benign lesion.   # Elevated blood pressure systolic 017B/93J- stable/improved.   # I reviewed the blood work- with the patient in detail; also reviewed the imaging independently [as summarized above]; and with the patient in detail.   # 25 minutes face-to-face with the patient discussing the above plan of care; more than 50% of time spent on prognosis/ natural history; counseling and coordination.  # DISPOSITION: # follow up as needed.   Cc; Dr.Perry

## 2019-01-03 NOTE — Progress Notes (Signed)
[place] NOTE  Patient Care Team: Lillard Anes, MD as PCP - General (Family Medicine)  CHIEF COMPLAINTS/PURPOSE OF CONSULTATION:   #April 2017-B12 DEF MACROCYTIC ANEMIA [B12 =50; positive IF/anti-parietal AB] autoimmune related; B12 IM monthly [at home]  # LLL lung nodule 41mm [april 2017]; prior hx of smoking; jan 2020- STABLE [no futher follow ups]  # A.fib with RVR- not on anti-coag; hypothyroidism [s/p surgery]; Peripheral neuropathy- ? B 12 def  HISTORY OF PRESENTING ILLNESS:  Sydney Ayala 73 y.o.  female history of B12 deficiency/ macrocytic anemia is here for follow-up; history of lung nodules is here to review the results of the CT scan.  In the interim patient was evaluated by orthopedics had left knee arthroscopic surgery.  She also had injection in her hands for small joint arthritis.  She continues to be on B12 injections at home.  Denies any unusual shortness of breath or cough.  Appetite is good.  No weight loss.  Review of Systems  Constitutional: Negative for chills, diaphoresis, fever, malaise/fatigue and weight loss.  HENT: Negative for nosebleeds and sore throat.   Eyes: Negative for double vision.  Respiratory: Negative for cough, hemoptysis, sputum production, shortness of breath and wheezing.   Cardiovascular: Negative for chest pain, palpitations, orthopnea and leg swelling.  Gastrointestinal: Negative for abdominal pain, blood in stool, constipation, diarrhea, heartburn, melena, nausea and vomiting.  Genitourinary: Negative for dysuria, frequency and urgency.  Musculoskeletal: Positive for back pain and joint pain.  Skin: Negative.  Negative for itching and rash.  Neurological: Negative for dizziness, tingling, focal weakness, weakness and headaches.  Endo/Heme/Allergies: Does not bruise/bleed easily.  Psychiatric/Behavioral: Negative for depression. The patient is not nervous/anxious and does not have insomnia.       MEDICAL HISTORY:  Past Medical History:  Diagnosis Date  . Anemia   . Arthritis     Back, shoulders, arms  . Chronic low back pain LUMBAR   RELEIVE W/ TENS UNIT PRN AND CHRIOPACTOR  . Chronic venous insufficiency   . Dyspnea   . Dysrhythmia    A Fib during hospital stay 04/03/16  . Ganglion cyst LEFT RING FINGER  . H/O hiatal hernia   . H/O tenderness in limb BILATEARL LEGS AND HEELS DUE TO TENDINITIS AND SPURS  . Heart murmur ASYMPTOMATIC   s/p rheumatic fever  . History of rheumatic fever CHILDHOOD  . Hypothyroidism   . Left hand weakness   . Mild acid reflux   . Motion sickness    cars  . Numbness and tingling BILATERAL ARMS SECONDARY TO CERVICAL PINCHED NERVE  . Peripheral vascular disease (HCC)    legs.   . Pinched nerve in neck   . Short of breath on exertion   . Snores   . Urge urinary incontinence   . Varicose veins    Left Leg    SURGICAL HISTORY: Past Surgical History:  Procedure Laterality Date  . ABDOMINAL HYSTERECTOMY    . CARDIOVASCULAR STRESS TEST  APRIL 2009   NORMAL LVSF/ EF 61%/ NO ISCHEMIA  . COLONOSCOPY WITH PROPOFOL N/A 04/17/2016   Procedure: COLONOSCOPY WITH PROPOFOL;  Surgeon: Lucilla Lame, MD;  Location: [place];  Service: Endoscopy;  Laterality: N/A;  . DILATION AND CURETTAGE OF UTERUS    . ESOPHAGOGASTRODUODENOSCOPY (EGD) WITH PROPOFOL N/A 04/04/2016   Procedure: ESOPHAGOGASTRODUODENOSCOPY (EGD) WITH PROPOFOL;  Surgeon: Lucilla Lame, MD;  Location: [facility];  Service: Endoscopy;  Laterality: N/A;  . GANGLION CYST EXCISION  06/09/2012   Procedure: REMOVAL GANGLION OF WRIST;  Surgeon: Magnus Sinning, MD;  Location: [place];  Service: Orthopedics;  Laterality: Left;  EXCISION OF GANGLION CYST LEFT RING FINGER  IV REGIONAL ANESTHESIA  . [place]  . KNEE ARTHROSCOPY WITH MEDIAL MENISECTOMY Left 11/26/2017   Procedure: KNEE ARTHROSCOPY WITH MEDIAL MENISECTOMY;  Surgeon:  Corky Mull, MD;  Location: [facility];  Service: Orthopedics;  Laterality: Left;  . LAPAROSCOPIC CHOLECYSTECTOMY  05-13-2002   Gall Bladder  . LEFT BREAST DUCTAL EXCISION  10-11-2009   BENIGN  . LEFT HAND SURG.  1980'S   EXTENSIVE REPAIR AND REMOVAL OF LIGAMENT/TENDON  (?CANCER)  . LUMBAR LAMINECTOMY  1990  &  1976   RIGHT SIDE L4 - 5  & L5 - S1  . POLYPECTOMY  04/17/2016   Procedure: POLYPECTOMY;  Surgeon: Lucilla Lame, MD;  Location: [facility];  Service: Endoscopy;;  . REMOVAL RIGHT GOIN MASS  1980'S   BENIGN  . REMOVAL TOE NAILBED  AGE 73   ALL 10 TOES-- DEFORMED  . RIGHT BREAST LUMPECTOMY     BENIGN  . RIGHT FOOT SURG  X3   REPAIR OF 2 TOES  . ROTATOR CUFF REPAIR  04-11-2008   LEFT SHOULDER  . SPINE SURGERY    . THYROID GOITER REMOVED  1970'S  . TONSILLECTOMY  CHILD  . VAGINAL HYSTERECTOMY/ ANTERIOR & POSTERIOR REPAIR/ TRANSOBTURATOR [facility]  04-25-2005   CYSTOCELE/ RECTOCELE/ UTERINE PROLAPSE/ SUI    SOCIAL HISTORY: Social History   Socioeconomic History  . Marital status: Divorced    Spouse name: Not on file  . Number of children: Not on file  . Years of education: Not on file  . Highest education level: Not on file  Occupational History  . Not on file  Social Needs  . Financial resource strain: Not on file  . Food insecurity:    Worry: Not on file    Inability: Not on file  . Transportation needs:    Medical: Not on file    Non-medical: Not on file  Tobacco Use  . Smoking status: Former Smoker    Years: 40.00    Types: Cigarettes    Last attempt to quit: 06/04/1991    Years since quitting: 27.6  . Smokeless tobacco: Never Used  Substance and Sexual Activity  . Alcohol use: No  . Drug use: No  . Sexual activity: Not on file  Lifestyle  . Physical activity:    Days per week: Not on file    Minutes per session: Not on file  . Stress: Not on file  Relationships  . Social connections:    Talks on phone: Not on file    Gets together: Not on file     Attends religious service: Not on file    Active member of club or organization: Not on file    Attends meetings of clubs or organizations: Not on file    Relationship status: Not on file  . Intimate partner violence:    Fear of current or ex partner: Not on file    Emotionally abused: Not on file    Physically abused: Not on file    Forced sexual activity: Not on file  Other Topics Concern  . Not on file  Social History Narrative  . Not on file    FAMILY HISTORY: Family History  Problem Relation Age of Onset  . Deep vein thrombosis Mother  Right Leg  . Heart disease Mother        Before age 63-  CHF  . Varicose Veins Mother   . Heart attack Mother   . Heart disease Father        Before age 60-  PVD  . Heart attack Father   . Arthritis Father        Gout  . Hypertension Father   . Cancer Sister        Thyroid-Back  . Cancer Sister        Breast and Lung  . Heart disease Sister   . Heart disease Sister     ALLERGIES:  is allergic to latex; codeine; contrast media [iodinated diagnostic agents]; indomethacin; and adhesive [tape].  MEDICATIONS:  Current Outpatient Medications  Medication Sig Dispense Refill  . acetaminophen (TYLENOL) 500 MG tablet Take 1,000 mg by mouth every 8 (eight) hours as needed for mild pain.    . brompheniramine-pseudoephedrine-dextromethorphan (DIMETAPP DM) 15-1-5 MG/5ML ELIX Take 5 mLs by mouth every 4 (four) hours as needed (for cough for 2 days).    . cyanocobalamin (,VITAMIN B-12,) 1000 MCG/ML injection 1 TIME PER MONTH 12 mL 0  . diltiazem (CARDIZEM CD) 120 MG 24 hr capsule TAKE ONE CAPSULE BY MOUTH EVERY DAY    . Multiple Vitamin (MULTIVITAMIN WITH MINERALS) TABS tablet Take 1 tablet by mouth daily. 30 tablet 0  . naproxen (NAPROSYN) 500 MG tablet Take 500 mg by mouth daily.    . potassium chloride SA (K-DUR,KLOR-CON) 20 MEQ tablet Take 1 tablet (20 mEq total) by mouth once. (Patient taking differently: Take 20 mEq by mouth daily.  ) 30 tablet 3  . SYNTHROID 100 MCG tablet Take 100 mcg by mouth daily.  4  . Syringe/Needle, Disp, (SYRINGE 3CC/22GX1") 22G X 1" 3 ML MISC 1 Syringe by Does not apply route once. Please dispense syringe/needle for b12 injections. Pt will self administer her own injections. 50 each 4  . traMADol (ULTRAM) 50 MG tablet Take 1-2 tablets (50-100 mg total) by mouth every 6 (six) hours as needed for moderate pain. 40 tablet 0  . TUBERCULIN SYR 1CC/25GX5/8" (B-D TB SYRINGE 1CC/25GX5/8") 25G X 5/8" 1 ML MISC Once a month 12 each 0   No current facility-administered medications for this visit.       Marland Kitchen  PHYSICAL EXAMINATION:   Vitals:   01/03/19 1022  BP: (!) 157/75  Pulse: (!) 58  Resp: 16  Temp: (!) 97.4 F (36.3 C)   Filed Weights   01/03/19 1022  Weight: 155 lb 14.4 oz (70.7 kg)    Physical Exam  Constitutional: She is oriented to person, place, and time and well-developed, well-nourished, and in no distress.  HENT:  Head: Normocephalic and atraumatic.  Mouth/Throat: Oropharynx is clear and moist. No oropharyngeal exudate.  Eyes: Pupils are equal, round, and reactive to light.  Neck: Normal range of motion. Neck supple.  Cardiovascular: Normal rate and regular rhythm.  Pulmonary/Chest: No respiratory distress. She has no wheezes.  Abdominal: Soft. Bowel sounds are normal. She exhibits no distension and no mass. There is no abdominal tenderness. There is no rebound and no guarding.  Musculoskeletal: Normal range of motion.        General: No tenderness or edema.  Neurological: She is alert and oriented to person, place, and time.  Skin: Skin is warm.  Hyperpigmentation in the dorsum of the hand bilaterally.  Chronic.  Psychiatric: Affect normal.     LABORATORY DATA:  I have reviewed the data as listed Lab Results  Component Value Date   WBC 6.2 01/03/2019   HGB 14.1 01/03/2019   HCT 44.2 01/03/2019   MCV 91.3 01/03/2019   PLT 239 01/03/2019   Recent Labs     01/03/19 0942  NA 141  K 4.4  CL 104  CO2 27  GLUCOSE 94  BUN 25*  CREATININE 0.74  CALCIUM 9.4  GFRNONAA >60  GFRAA >60  PROT 7.5  ALBUMIN 4.4  AST 21  ALT 17  ALKPHOS 86  BILITOT 0.8      ASSESSMENT & PLAN:  Vitamin B12 deficiency due to intestinal malabsorption # B12 deficiency anemia- autoimmune related on B12 IM on a monthly basis.  Hemoglobin normal 13-14.  Stable.  #Continue B12 injections monthly basis at home/rest of life.  No further recommendations from hematology standpoint.  Patient will continue follow-up with PCP for refills/follow-up.  # Left lower lobe lung nodule 7 mm- STABLE; reviewed the Jan 2020- STABLE; benign lesion.   # Elevated blood pressure systolic 048G/89V- stable/improved.   # I reviewed the blood work- with the patient in detail; also reviewed the imaging independently [as summarized above]; and with the patient in detail.   # 25 minutes face-to-face with the patient discussing the above plan of care; more than 50% of time spent on prognosis/ natural history; counseling and coordination.  # DISPOSITION: # follow up as needed.   Cc; Dr.Perry   # I reviewed the blood work- with the patient in detail; also reviewed the imaging independently [as summarized above]; and with the patient in detail.    Cammie Sickle, MD 01/03/2019 11:17 AM

## 2019-03-01 DIAGNOSIS — I10 Essential (primary) hypertension: Secondary | ICD-10-CM | POA: Diagnosis not present

## 2019-03-01 DIAGNOSIS — E782 Mixed hyperlipidemia: Secondary | ICD-10-CM | POA: Diagnosis not present

## 2019-03-01 DIAGNOSIS — E038 Other specified hypothyroidism: Secondary | ICD-10-CM | POA: Diagnosis not present

## 2019-03-02 DIAGNOSIS — E039 Hypothyroidism, unspecified: Secondary | ICD-10-CM | POA: Diagnosis not present

## 2019-03-02 DIAGNOSIS — M4726 Other spondylosis with radiculopathy, lumbar region: Secondary | ICD-10-CM | POA: Diagnosis not present

## 2019-03-02 DIAGNOSIS — Z6826 Body mass index (BMI) 26.0-26.9, adult: Secondary | ICD-10-CM | POA: Diagnosis not present

## 2019-03-02 DIAGNOSIS — I10 Essential (primary) hypertension: Secondary | ICD-10-CM | POA: Diagnosis not present

## 2019-03-02 DIAGNOSIS — N3941 Urge incontinence: Secondary | ICD-10-CM | POA: Diagnosis not present

## 2019-03-02 DIAGNOSIS — E782 Mixed hyperlipidemia: Secondary | ICD-10-CM | POA: Diagnosis not present

## 2019-03-02 DIAGNOSIS — I700 Atherosclerosis of aorta: Secondary | ICD-10-CM | POA: Diagnosis not present

## 2019-03-30 DIAGNOSIS — R079 Chest pain, unspecified: Secondary | ICD-10-CM | POA: Diagnosis not present

## 2019-03-30 DIAGNOSIS — R0602 Shortness of breath: Secondary | ICD-10-CM | POA: Diagnosis not present

## 2019-03-30 DIAGNOSIS — I480 Paroxysmal atrial fibrillation: Secondary | ICD-10-CM | POA: Diagnosis not present

## 2019-03-30 DIAGNOSIS — I4891 Unspecified atrial fibrillation: Secondary | ICD-10-CM | POA: Diagnosis not present

## 2019-03-30 DIAGNOSIS — I10 Essential (primary) hypertension: Secondary | ICD-10-CM | POA: Diagnosis not present

## 2019-04-20 DIAGNOSIS — R0602 Shortness of breath: Secondary | ICD-10-CM | POA: Diagnosis not present

## 2019-04-20 DIAGNOSIS — R079 Chest pain, unspecified: Secondary | ICD-10-CM | POA: Diagnosis not present

## 2019-04-28 DIAGNOSIS — K922 Gastrointestinal hemorrhage, unspecified: Secondary | ICD-10-CM | POA: Diagnosis not present

## 2019-04-28 DIAGNOSIS — I480 Paroxysmal atrial fibrillation: Secondary | ICD-10-CM | POA: Diagnosis not present

## 2019-04-28 DIAGNOSIS — M542 Cervicalgia: Secondary | ICD-10-CM | POA: Diagnosis not present

## 2019-04-28 DIAGNOSIS — I10 Essential (primary) hypertension: Secondary | ICD-10-CM | POA: Diagnosis not present

## 2019-05-05 DIAGNOSIS — M5412 Radiculopathy, cervical region: Secondary | ICD-10-CM | POA: Diagnosis not present

## 2019-05-17 DIAGNOSIS — M48061 Spinal stenosis, lumbar region without neurogenic claudication: Secondary | ICD-10-CM | POA: Diagnosis not present

## 2019-05-17 DIAGNOSIS — M5136 Other intervertebral disc degeneration, lumbar region: Secondary | ICD-10-CM | POA: Diagnosis not present

## 2019-05-17 DIAGNOSIS — M5412 Radiculopathy, cervical region: Secondary | ICD-10-CM | POA: Diagnosis not present

## 2019-05-17 DIAGNOSIS — M4802 Spinal stenosis, cervical region: Secondary | ICD-10-CM | POA: Diagnosis not present

## 2019-05-17 DIAGNOSIS — M4312 Spondylolisthesis, cervical region: Secondary | ICD-10-CM | POA: Diagnosis not present

## 2019-05-17 DIAGNOSIS — M5416 Radiculopathy, lumbar region: Secondary | ICD-10-CM | POA: Diagnosis not present

## 2019-05-19 DIAGNOSIS — D692 Other nonthrombocytopenic purpura: Secondary | ICD-10-CM | POA: Diagnosis not present

## 2019-05-19 DIAGNOSIS — Z1159 Encounter for screening for other viral diseases: Secondary | ICD-10-CM | POA: Diagnosis not present

## 2019-05-31 DIAGNOSIS — I10 Essential (primary) hypertension: Secondary | ICD-10-CM | POA: Diagnosis not present

## 2019-05-31 DIAGNOSIS — R55 Syncope and collapse: Secondary | ICD-10-CM | POA: Diagnosis not present

## 2019-06-02 IMAGING — MR MR ELBOW*R* W/O CM
6 series · 39 of 40 positions shown · non-contrast
Comparison: None.

CLINICAL DATA: Right elbow pain and possible biceps tendon tear
since an injury picking up a skillet 11/20/2017 and 11/23/2017.

EXAM:
MRI OF THE RIGHT ELBOW WITHOUT CONTRAST
TECHNIQUE: Multiplanar, multisequence MR imaging of the elbow was performed. No
intravenous contrast was administered.

[Series 8: T1 · axial · 4.0mm · 0.62mm/px · z∈[-81,+44]mm · 6 of 25 slices shown]
[im 1/25]
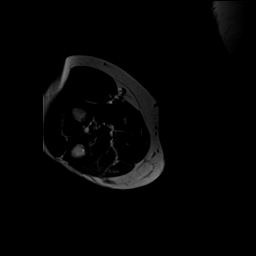
[im 5/25]
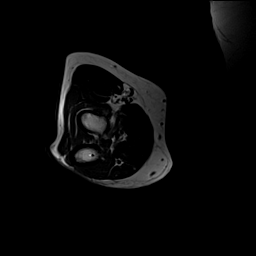
[im 10/25]
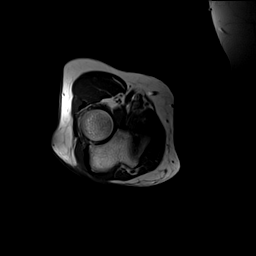
[im 15/25]
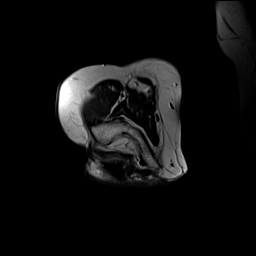
[im 20/25]
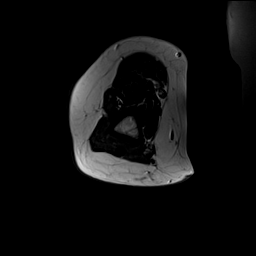
[im 25/25]
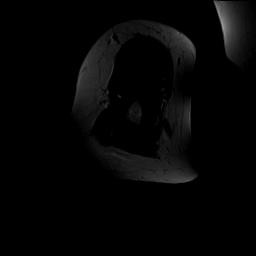

[Series 9: T2 fat-sat · axial · 4.0mm · 0.62mm/px · z∈[-81,+44]mm · 7 of 25 slices shown (1 of 2)]
[im 1/25]
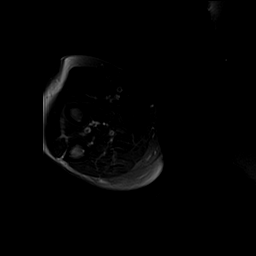
[im 5/25]
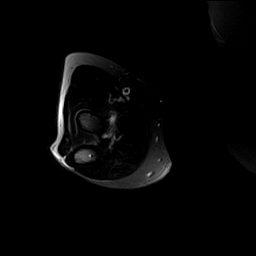
[im 9/25]
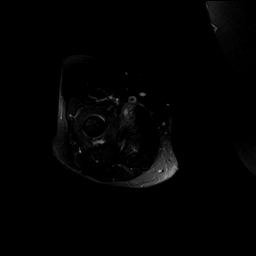
[im 13/25]
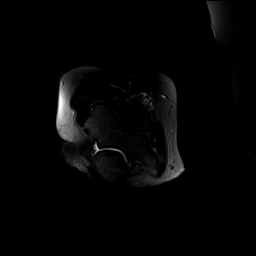
[im 17/25]
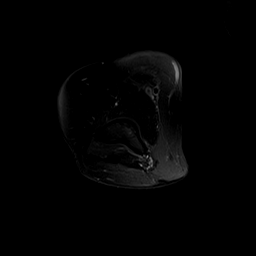
[im 21/25]
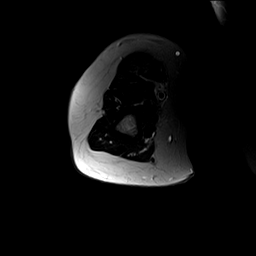
[im 25/25]
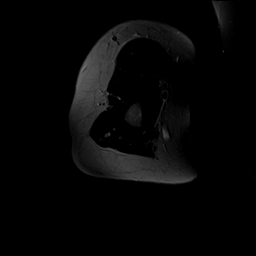

[Series 10: T2 fat-sat · coronal · 4.0mm · 0.70mm/px · 6 of 22 slices shown (2 of 2)]
[im 1/22]
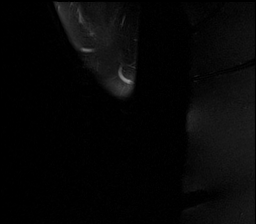
[im 5/22]
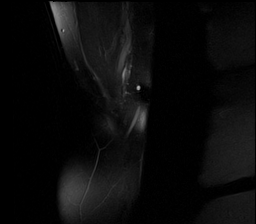
[im 9/22]
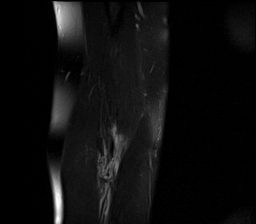
[im 13/22]
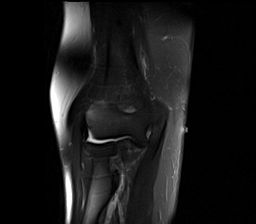
[im 17/22]
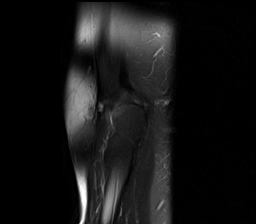
[im 22/22]
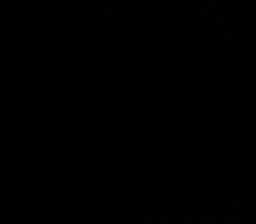

[Series 11: PD fat-sat · sagittal · 3.0mm · 0.62mm/px · 8 of 30 slices shown]
[im 1/30]
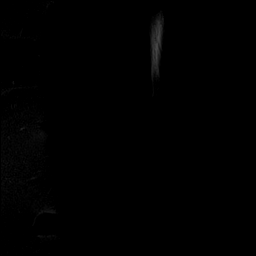
[im 5/30]
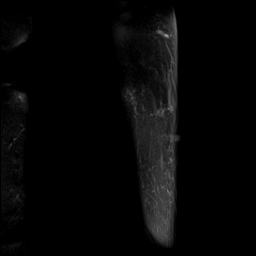
[im 9/30]
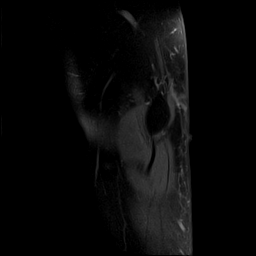
[im 13/30]
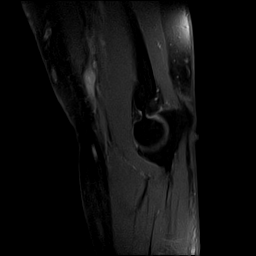
[im 17/30]
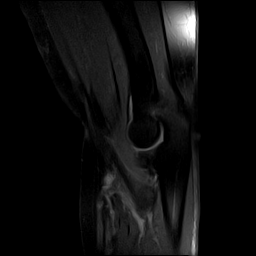
[im 21/30]
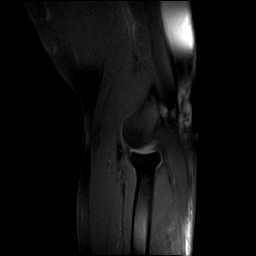
[im 25/30]
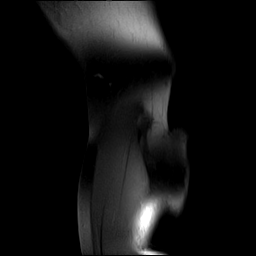
[im 30/30]
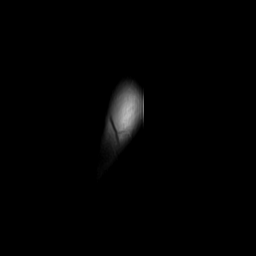

[Series 12: STIR · axial · 4.0mm · 0.31mm/px · z∈[-81,+44]mm · 7 of 25 slices shown (1 of 2)]
[im 1/25]
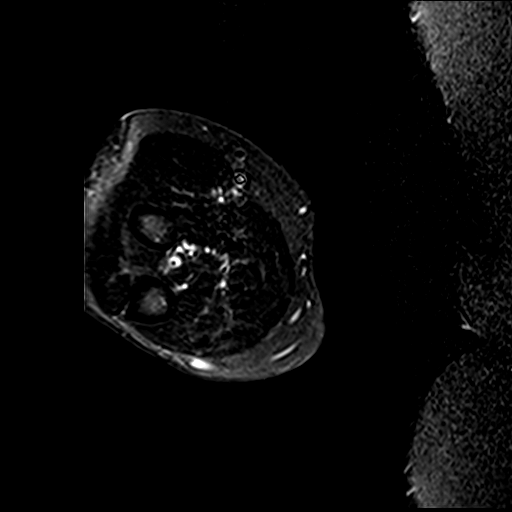
[im 5/25]
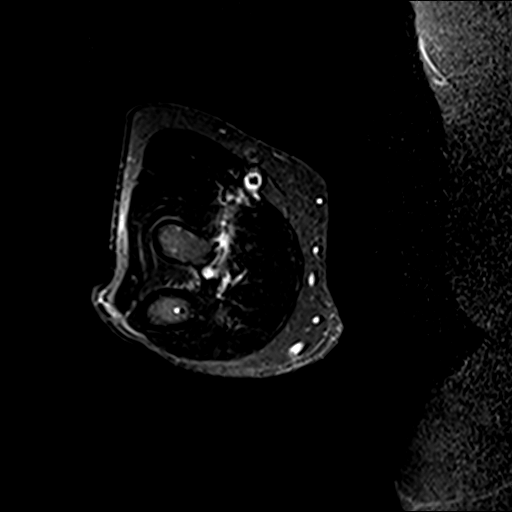
[im 9/25]
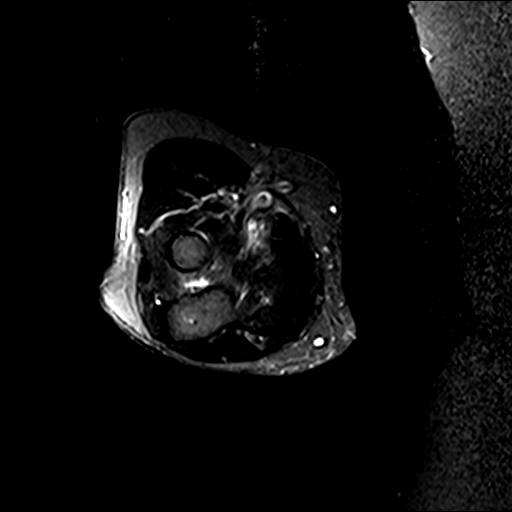
[im 13/25]
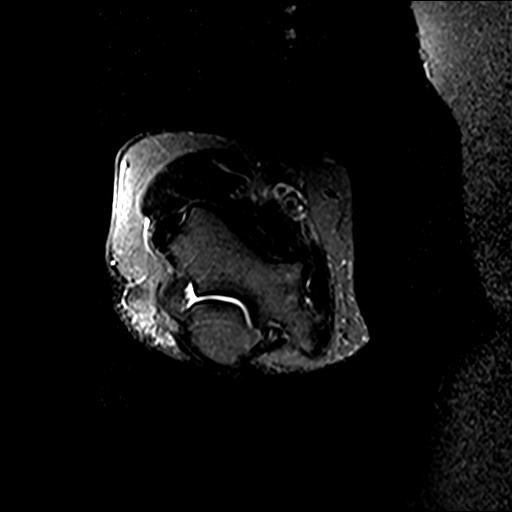
[im 17/25]
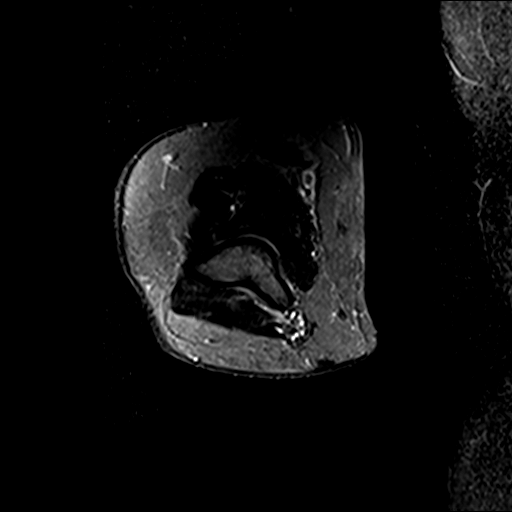
[im 21/25]
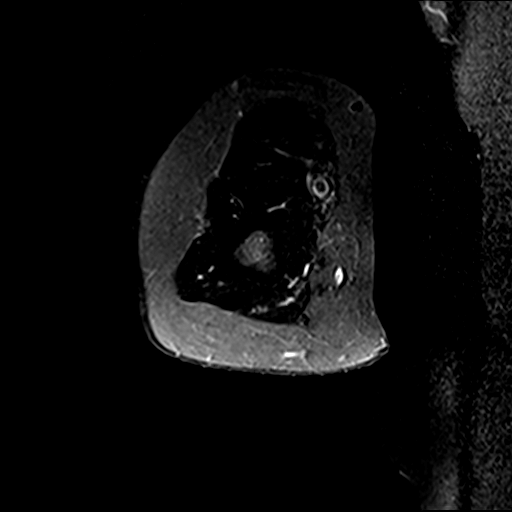
[im 25/25]
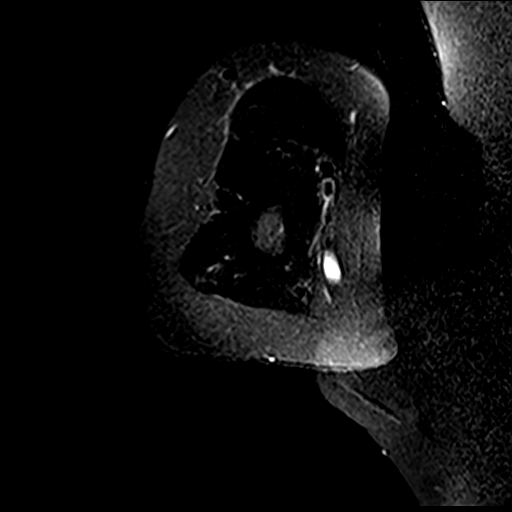

[Series 13: STIR · coronal · 4.0mm · 0.35mm/px · 5 of 22 slices shown (2 of 2)]
[im 1/22]
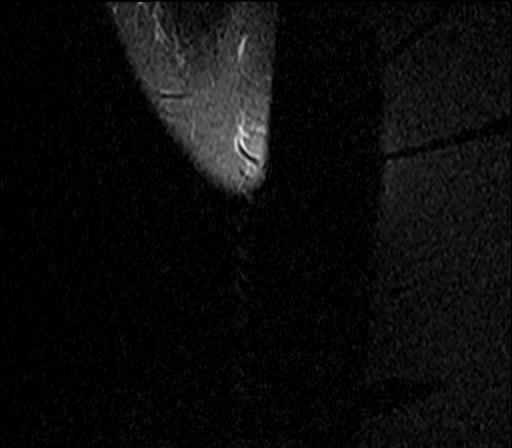
[im 5/22]
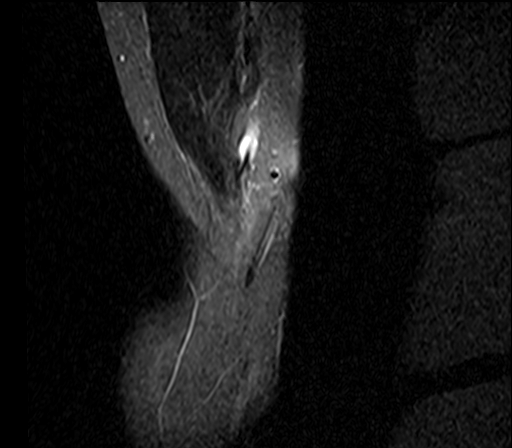
[im 9/22]
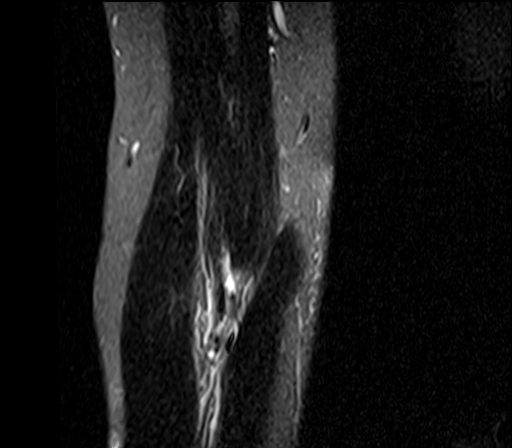
[im 13/22]
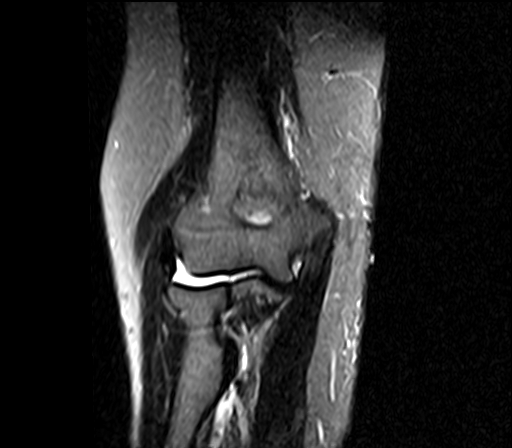
[im 17/22]
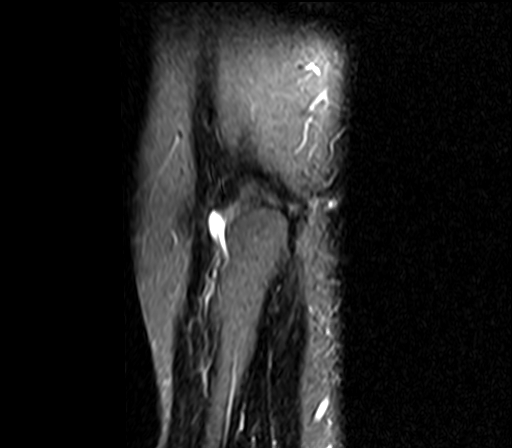

[39 of 40 positions shown; findings below may reference images not displayed]

FINDINGS: TENDONS

Common forearm flexor origin: Intact and normal in appearance.

Common forearm extensor origin: Intact and normal in appearance.

Biceps: Intact and normal in appearance.

Triceps: Intact and normal in appearance.

LIGAMENTS

Medial stabilizers: Intact and normal in appearance.

Lateral stabilizers:  Intact and normal in appearance.

Cartilage: Unremarkable.

Joint: No effusion.

Cubital tunnel: Normal.

Bones: Normal marrow signal throughout.
IMPRESSION: Negative for biceps tendon tear.  Normal right elbow MRI.

## 2019-06-28 ENCOUNTER — Ambulatory Visit (INDEPENDENT_AMBULATORY_CARE_PROVIDER_SITE_OTHER): Payer: Medicare Other | Admitting: Neurology

## 2019-06-28 ENCOUNTER — Telehealth: Payer: Self-pay | Admitting: Neurology

## 2019-06-28 ENCOUNTER — Other Ambulatory Visit: Payer: Self-pay

## 2019-06-28 ENCOUNTER — Encounter: Payer: Self-pay | Admitting: Neurology

## 2019-06-28 VITALS — BP 153/85 | HR 77 | Temp 97.3°F | Ht 65.0 in | Wt 158.0 lb

## 2019-06-28 DIAGNOSIS — R42 Dizziness and giddiness: Secondary | ICD-10-CM

## 2019-06-28 DIAGNOSIS — R55 Syncope and collapse: Secondary | ICD-10-CM

## 2019-06-28 DIAGNOSIS — A881 Epidemic vertigo: Secondary | ICD-10-CM

## 2019-06-28 DIAGNOSIS — H81399 Other peripheral vertigo, unspecified ear: Secondary | ICD-10-CM

## 2019-06-28 DIAGNOSIS — G450 Vertebro-basilar artery syndrome: Secondary | ICD-10-CM

## 2019-06-28 DIAGNOSIS — H8112 Benign paroxysmal vertigo, left ear: Secondary | ICD-10-CM | POA: Diagnosis not present

## 2019-06-28 DIAGNOSIS — H814 Vertigo of central origin: Secondary | ICD-10-CM | POA: Diagnosis not present

## 2019-06-28 MED ORDER — GABAPENTIN 100 MG PO CAPS: 100.0000 mg | ORAL_CAPSULE | Freq: Every evening | ORAL | 6 refills | Status: DC | PRN

## 2019-06-28 NOTE — Telephone Encounter (Signed)
Medicare/medicaid order sent to GI. No auth they will reach out to the patient to schedule.  °

## 2019-06-28 NOTE — Progress Notes (Addendum)
[facility]    Provider:  Dr Jaynee Eagles Requesting Provider: Dr. Annette Stable Primary Care Provider:  Lillard Anes, MD  CC:  Vertigo and left leg pain  HPI:  Sydney Ayala is a 73 y.o. female here as requested by Dr. Annette Stable for pre-syncope. When she puts her head back and arms up at the same time he feels like she is going to fall. This started at the chiropractor 5 years ago. She has leg pain and radiculopathy likely chronic from her radiculopathy. She has a lot of pain. When she goes to get she has pain and radiculopathy down the leg. The leg symptoms started after the surgery.   Near syncope: If she extends her neck there is not any symptoms. They seem to subside and then come back. Raising arms alone will not cause the symptoms. But when she raises her arms and extends her neck like reaching for something or looking up. If she drops her arms or head it will go away. Everything goes black and she feels like she is spinning. Also happens in bed when she rolls over can't roll to her left side.   Left leg pain. She had her knee surgery prior to 2018 and possibly around that time she started having these symptoms but she is a poor historian. She had torn meniscus and went to the hospital, they put her in a leg brace from the thigh to the foot. It was very tight and she could not bend her leg. She fell in the brace and couldn't get up. The leg hurts every night and during the day sometimes. Sitting on it makes it worse, moving helps.   Reviewed notes, labs and imaging from outside physicians, which showed:   This is a patient sent over from Dr. Irven Baltimore office for syncope and collapse.  She was last seen May 31, 2019.  Per that note the patient returned, continues to complain of symptoms of blacking out with certain head positions or arm positions, some occasional numbness and tingling in her hands, no new symptoms of weakness.  She has chronic stable left lower extremity radicular  symptoms.  She has no new lower extremity weakness.  She has no bowel or bladder complaints.  She has recently had a follow-up MRI scan both of her cervical and lumbar spine.  She presented for review of those.  Examination including physical exam, neurologic, musculoskeletal, motor, sensory were normal.  MRI scan of her cervical spine demonstrated evidence of significant disc degeneration with associated spondylosis at C4-C5 and C5-C6, moderate neural foraminal narrowing, no high-grade spinal stenosis, no evidence of significant cord compression, no indication of instability.  MRI of the lumbar spine demonstrates decompressive surgery previously done at L4-L5, she has significant clumping of her nerve roots consistent with arachnoiditis, she has no evidence of new disc herniation or stenosis.  No compressive lesions within her cervical spine, her vertebral arteries appear patent.  I reviewed MRI reports of the cervical and the lumbar spine as well and agree with the assessment above from Dr. Irven Baltimore notes. No abdominal pain or swelling, fluid in the abdomen.  Review of Systems: Patient complains of symptoms per HPI as well as the following symptoms: Swelling in legs, spinning sensation, joint pain, joint swelling, cramps, aching muscles, numbness, weakness, dizziness, restless legs, not enough sleep. Pertinent negatives and positives per HPI. All others negative.   Social History   Socioeconomic History  . Marital status: Divorced    Spouse name: Not  on file  . Number of children: 3  . Years of education: Not on file  . Highest education level: 8th grade  Occupational History  . Not on file  Social Needs  . Financial resource strain: Not on file  . Food insecurity    Worry: Not on file    Inability: Not on file  . Transportation needs    Medical: Not on file    Non-medical: Not on file  Tobacco Use  . Smoking status: Former Smoker    Years: 40.00    Types: Cigarettes    Quit date: 1993     Years since quitting: 27.5  . Smokeless tobacco: Never Used  Substance and Sexual Activity  . Alcohol use: Never    Frequency: Never  . Drug use: Never  . Sexual activity: Not on file  Lifestyle  . Physical activity    Days per week: Not on file    Minutes per session: Not on file  . Stress: Not on file  Relationships  . Social Herbalist on phone: Not on file    Gets together: Not on file    Attends religious service: Not on file    Active member of club or organization: Not on file    Attends meetings of clubs or organizations: Not on file    Relationship status: Not on file  . Intimate partner violence    Fear of current or ex partner: Not on file    Emotionally abused: Not on file    Physically abused: Not on file    Forced sexual activity: Not on file  Other Topics Concern  . Not on file  Social History Narrative   Lives alone   Retired   Caffeine: coffee 3 cups daily    Family History  Problem Relation Age of Onset  . Deep vein thrombosis Mother        Right Leg  . Heart disease Mother        Before age 3-  CHF  . Varicose Veins Mother   . Heart attack Mother   . Heart disease Father        Before age 61-  PVD  . Heart attack Father   . Arthritis Father        Gout  . Hypertension Father   . Cancer Sister        Thyroid-Back  . Cancer Sister        Breast and Lung  . Heart disease Sister   . Heart disease Sister   . Other Sister        platelet disorder  . Colon cancer Maternal Grandmother   . Breast cancer Maternal Grandmother   . Heart attack Other        father's side multiple family members  . Stroke Other        father's side multiple family members  . Colon cancer Maternal Uncle   . Diabetes Maternal Uncle   . Diabetes Maternal Aunt     Past Medical History:  Diagnosis Date  . Anemia   . Arthritis     Back, shoulders, arms  . Atrial fibrillation ([place])   . B12 deficiency   . Chronic low back pain LUMBAR   RELEIVE W/  TENS UNIT PRN AND CHRIOPACTOR  . Chronic venous insufficiency   . Dyspnea   . Dysrhythmia    A Fib during hospital stay 04/03/16  . Ganglion cyst LEFT RING FINGER  .  H/O hiatal hernia    pt denies any knowledge of this  . H/O tenderness in limb BILATEARL LEGS AND HEELS DUE TO TENDINITIS AND SPURS  . Heart murmur ASYMPTOMATIC   s/p rheumatic fever  . History of rheumatic fever CHILDHOOD  . Hypothyroidism   . Injury of tendon of long head of right biceps 2018  . Left hand weakness   . Mild acid reflux    pt says she does not have a problem with this  . Motion sickness    cars  . Numbness and tingling BILATERAL ARMS SECONDARY TO CERVICAL PINCHED NERVE  . Peripheral vascular disease ([facility])    legs.   . Pinched nerve in neck   . Short of breath on exertion   . Snores   . Urge urinary incontinence   . Varicose veins    Left Leg    Patient Active Problem List   Diagnosis Date Noted  . Vitamin B12 deficiency due to intestinal malabsorption 11/20/2016  . Lung nodule, solitary 11/20/2016  . Other fecal abnormalities   . Iron deficiency anemia, unspecified   . Benign neoplasm of descending colon   . Blood in stool   . Intractable cyclical vomiting with nausea   . Acute GI bleeding   . Diarrhea   . Nausea, vomiting and diarrhea 04/03/2016  . Paroxysmal atrial fibrillation with RVR ([place]) 04/03/2016  . Hypothyroidism 04/03/2016  . GERD (gastroesophageal reflux disease) 04/03/2016  . [place] 04/03/2016  . Varicose veins of leg with complications 20/94/7096  . Bleeding from varicose vein 06/22/2015  . Chronic venous insufficiency 06/22/2015    Past Surgical History:  Procedure Laterality Date  . ABDOMINAL HYSTERECTOMY    . CARDIOVASCULAR STRESS TEST  APRIL 2009   NORMAL LVSF/ EF 61%/ NO ISCHEMIA  . COLONOSCOPY WITH PROPOFOL N/A 04/17/2016   Procedure: COLONOSCOPY WITH PROPOFOL;  Surgeon: Lucilla Lame, MD;  Location: [facility];  Service: Endoscopy;  Laterality: N/A;  .  DILATION AND CURETTAGE OF UTERUS    . ESOPHAGOGASTRODUODENOSCOPY (EGD) WITH PROPOFOL N/A 04/04/2016   Procedure: ESOPHAGOGASTRODUODENOSCOPY (EGD) WITH PROPOFOL;  Surgeon: Lucilla Lame, MD;  Location: [facility];  Service: Endoscopy;  Laterality: N/A;  . GANGLION CYST EXCISION  06/09/2012   Procedure: REMOVAL GANGLION OF WRIST;  Surgeon: Magnus Sinning, MD;  Location: [facility];  Service: Orthopedics;  Laterality: Left;  EXCISION OF GANGLION CYST LEFT RING FINGER  IV REGIONAL ANESTHESIA  . [place]  . KNEE ARTHROSCOPY WITH MEDIAL MENISECTOMY Left 11/26/2017   Procedure: KNEE ARTHROSCOPY WITH MEDIAL MENISECTOMY;  Surgeon: Corky Mull, MD;  Location: [facility];  Service: Orthopedics;  Laterality: Left;  . LAPAROSCOPIC CHOLECYSTECTOMY  05-13-2002   Gall Bladder  . LEFT BREAST DUCTAL EXCISION  10-11-2009   BENIGN  . LEFT HAND SURG.  1980'S   EXTENSIVE REPAIR AND REMOVAL OF LIGAMENT/TENDON  (?CANCER)  . LUMBAR LAMINECTOMY  1990  &  1976   RIGHT SIDE L4 - 5  & L5 - S1  . POLYPECTOMY  04/17/2016   Procedure: POLYPECTOMY;  Surgeon: Lucilla Lame, MD;  Location[facility];  Service: Endoscopy;;  . REMOVAL RIGHT GOIN MASS  1980'S   BENIGN  . REMOVAL TOE NAILBED  AGE 89   ALL 10 TOES-- DEFORMED  . RIGHT BREAST LUMPECTOMY     BENIGN  . RIGHT FOOT SURG  X3   REPAIR OF 2 TOES  . ROTATOR CUFF REPAIR  04-11-2008  LEFT SHOULDER  . SPINE SURGERY    . THYROID GOITER REMOVED  1970'S  . TONSILLECTOMY  CHILD  . VAGINAL HYSTERECTOMY/ ANTERIOR & POSTERIOR REPAIR/ TRANSOBTURATOR SLING  04-25-2005   CYSTOCELE/ RECTOCELE/ UTERINE PROLAPSE/ SUI    Current Outpatient Medications  Medication Sig Dispense Refill  . acetaminophen (TYLENOL) 500 MG tablet Take 1,000 mg by mouth every 8 (eight) hours as needed for mild pain.    . cyanocobalamin (,VITAMIN B-12,) 1000 MCG/ML injection 1 TIME PER MONTH 12 mL 0  . diltiazem (CARDIZEM CD) 120 MG 24 hr  capsule TAKE ONE CAPSULE BY MOUTH EVERY DAY    . Multiple Vitamins-Minerals ([employer] SPECTRAVITE ADULT 50+) TABS Take 1 tablet by mouth daily.    . naproxen (NAPROSYN) 500 MG tablet Take 500 mg by mouth daily.    . potassium chloride SA (K-DUR,KLOR-CON) 20 MEQ tablet Take 1 tablet (20 mEq total) by mouth once. (Patient taking differently: Take 20 mEq by mouth daily. ) 30 tablet 3  . Probiotic Product (DIGESTIVE ADVANTAGE PO) Take 1 each by mouth every other day.    Marland Kitchen SYNTHROID 100 MCG tablet Take 125 mcg by mouth daily.   4  . Syringe/Needle, Disp, (SYRINGE 3CC/22GX1") 22G X 1" 3 ML MISC 1 Syringe by Does not apply route once. Please dispense syringe/needle for b12 injections. Pt will self administer her own injections. 50 each 4  . TUBERCULIN SYR 1CC/25GX5/8" (B-D TB SYRINGE 1CC/25GX5/8") 25G X 5/8" 1 ML MISC Once a month 12 each 0  . gabapentin (NEURONTIN) 100 MG capsule Take 1 capsule (100 mg total) by mouth at bedtime as needed. 30 capsule 6   No current facility-administered medications for this visit.     Allergies as of 06/28/2019 - Review Complete 06/28/2019  Allergen Reaction Noted  . Latex Rash 04/30/2015  . Codeine Nausea And Vomiting 06/03/2012  . Contrast media [iodinated diagnostic agents] Nausea And Vomiting and Other (See Comments) 06/03/2012  . Indomethacin Nausea And Vomiting and Other (See Comments) 04/15/2016  . Adhesive [tape] Rash and Other (See Comments) 06/02/2012    Vitals: BP (!) 153/85 (BP Location: Left Arm, Patient Position: Sitting)   Pulse 77   Temp (!) 97.3 F (36.3 C)   Ht 5\' 5"  (1.651 m)   Wt 158 lb (71.7 kg)   BMI 26.29 kg/m  Last Weight:  Wt Readings from Last 1 Encounters:  06/28/19 158 lb (71.7 kg)   Last Height:   Ht Readings from Last 1 Encounters:  06/28/19 5\' 5"  (1.651 m)     Physical exam: Exam: Gen: NAD, conversant, tangential                  CV: RRR, +SEM. No Carotid Bruits. No peripheral edema, warm, nontender Eyes: Conjunctivae  clear without exudates or hemorrhage Abdomen and pelvis: soft and non tender  Neuro: Detailed Neurologic Exam  Speech:    Speech is normal; fluent and spontaneous with normal comprehension.  Cognition:    The patient is oriented to person, place, and time;     recent and remote memory intact;     language fluent;     Impaired attention, concentration, fund of knowledge Cranial Nerves:    The pupils are equal, round, and reactive to light.  Attempted funduscopic exam could not visualize due to small pupils.  Visual fields are full to finger confrontation. Extraocular movements are intact. Trigeminal sensation is intact and the muscles of mastication are normal. The face is symmetric. The palate  elevates in the midline. Hearing intact. Voice is normal. Shoulder shrug is normal. The tongue has normal motion without fasciculations.   Coordination:    No dysmetria  Gait:    Not ataxic  Motor Observation:    No asymmetry, no atrophy, and no involuntary movements noted. Tone:    Normal muscle tone.    Posture:    Posture is normal. normal erect    Strength: Left hip flexion 4+ min out of 5 otherwise strength is V/V in the upper and lower limbs.      Sensation: intact to LT     Reflex Exam:  DTR's:    Absent AJs otherwise deep tendon reflexes in the upper and lower extremities are 2+ bilaterally.   Toes:    The toes are downgoing bilaterally.   Clonus:    Clonus is absent.    Assessment/Plan:   73 y.o. female here as requested by Dr. Annette Stable for near-syncope.  Near syncope: Ongoing for 5 years or longer. sounds vertiginous. But need to check blood vessels and brain. Would prefer CTA of the head and neck but she cannot take the contrast will order MRI of the brain w/wo contrast to evaluate for any other lesions, strokes, masses causing vertigo and MRA of the head for vertebrobasilar insufficiency given loss of vision with head extension. Will also send to vestibular therapy. Will  send for carotid doppler of blood vessels of the neck as well Dr[facility]olome Bill at [facility], patient and I discussed this at the end of the appointment and she is aware they will call her or to call us if she does not hear within 2 weeks, she agreed.  She wanted to also discuss Left leg radiculopathy medial thigh: May be from prior radiculopathy which is what Dr. Annette Stable suggested as well ,or from her arachnoiditis or compression injury from a tight brace she wore all the way up to the thigh when she had knee injury. She has thigh flexion weakness and sensory changes In the L3 distribution. Recommend epidural steroid injections or sympathetic block in the medial lateral cutaneous distribution may see Dr. Maryjean Ka but she declines. At this time will just try medications, very low dose Gabapentin. She sees Dr. Henrene Pastor every 3-6 months and he can titrate the dose as needed. Abdomen and pelvis soft and tender, no masses felt that could compress the nerve in the pelvis.  She sees Dr. Henrene Pastor every 3-6 months and he can titrate the dose as needed. No follow up in neuro needed unless symptoms worsen or new symptoms.   Addendum: MRI/MRA normal. We started Gabapentin and she feels she sleeps better and her cramps are better and the shooting pain into the thigh isn't as bad. But she feel that maybe the gabapentin is causing some confusion. I recommend stopping the gabapentin and see how she does and then maybe consider very low dose Lyrica. She started Vestibular therapy. She wants to try the Gabapentin again just as needed, I advised for any side effects please stop.  Orders Placed This Encounter  Procedures  . MR BRAIN W WO CONTRAST  . MR ANGIO HEAD WO CONTRAST  . Basic Metabolic Panel  . Ambulatory referral to Physical Therapy   Meds ordered this encounter  Medications  . gabapentin (NEURONTIN) 100 MG capsule    Sig: Take 1 capsule (100 mg total) by mouth at bedtime as needed.    Dispense:  30 capsule    Refill:  6  Cc: Lillard Anes,*,  Dr. Pool([facility])  1. Pre-syncope   2. Epidemic vertigo   3. Vertigo of central origin   4. Dizziness   5. Benign paroxysmal positional vertigo of left ear   6. Other peripheral vertigo, unspecified ear   7. Vertebrobasilar insufficiency      Sarina Ill, MD  [facility] [address] [place], [place] [address]  Phone [phone] Fax [phone]

## 2019-06-28 NOTE — Patient Instructions (Signed)
The leg pain likely coming from the low back. Options include injections or medication. Try low dose of gabapentin at night. Dr. Henrene Pastor can increase it if needed.  Sounds like Benign Positional Peripheral Vertigo. Will send to Vestibular therapy.  Will order CT Scan of the head and neck to ensure the blood vessels are ok    Benign Positional Vertigo Vertigo is the feeling that you or your surroundings are moving when they are not. Benign positional vertigo is the most common form of vertigo. This is usually a harmless condition (benign). This condition is positional. This means that symptoms are triggered by certain movements and positions. This condition can be dangerous if it occurs while you are doing something that could cause harm to you or others. This includes activities such as driving or operating machinery. What are the causes? In many cases, the cause of this condition is not known. It may be caused by a disturbance in an area of the inner ear that helps your brain to sense movement and balance. This disturbance can be caused by:  Viral infection (labyrinthitis).  Head injury.  Repetitive motion, such as jumping, dancing, or running. What increases the risk? You are more likely to develop this condition if:  You are a woman.  You are 67 years of age or older. What are the signs or symptoms? Symptoms of this condition usually happen when you move your head or your eyes in different directions. Symptoms may start suddenly, and usually last for less than a minute. They include:  Loss of balance and falling.  Feeling like you are spinning or moving.  Feeling like your surroundings are spinning or moving.  Nausea and vomiting.  Blurred vision.  Dizziness.  Involuntary eye movement (nystagmus). Symptoms can be mild and cause only minor problems, or they can be severe and interfere with daily life. Episodes of benign positional vertigo may return (recur) over time. Symptoms  may improve over time. How is this diagnosed? This condition may be diagnosed based on:  Your medical history.  Physical exam of the head, neck, and ears.  Tests, such as: ? MRI. ? CT scan. ? Eye movement tests. Your health care provider may ask you to change positions quickly while he or she watches you for symptoms of benign positional vertigo, such as nystagmus. Eye movement may be tested with a variety of exams that are designed to evaluate or stimulate vertigo. ? An electroencephalogram (EEG). This records electrical activity in your brain. ? Hearing tests. You may be referred to a health care provider who specializes in ear, nose, and throat (ENT) problems (otolaryngologist) or a provider who specializes in disorders of the nervous system (neurologist). How is this treated?  This condition may be treated in a session in which your health care provider moves your head in specific positions to adjust your inner ear back to normal. Treatment for this condition may take several sessions. Surgery may be needed in severe cases, but this is rare. In some cases, benign positional vertigo may resolve on its own in 2-4 weeks. Follow these instructions at home: Safety  Move slowly. Avoid sudden body or head movements or certain positions, as told by your health care provider.  Avoid driving until your health care provider says it is safe for you to do so.  Avoid operating heavy machinery until your health care provider says it is safe for you to do so.  Avoid doing any tasks that would be dangerous to you  or others if vertigo occurs.  If you have trouble walking or keeping your balance, try using a cane for stability. If you feel dizzy or unstable, sit down right away.  Return to your normal activities as told by your health care provider. Ask your health care provider what activities are safe for you. General instructions  Take over-the-counter and prescription medicines only as told  by your health care provider.  Drink enough fluid to keep your urine pale yellow.  Keep all follow-up visits as told by your health care provider. This is important. Contact a health care provider if:  You have a fever.  Your condition gets worse or you develop new symptoms.  Your family or friends notice any behavioral changes.  You have nausea or vomiting that gets worse.  You have numbness or a "pins and needles" sensation. Get help right away if you:  Have difficulty speaking or moving.  Are always dizzy.  Faint.  Develop severe headaches.  Have weakness in your legs or arms.  Have changes in your hearing or vision.  Develop a stiff neck.  Develop sensitivity to light. Summary  Vertigo is the feeling that you or your surroundings are moving when they are not. Benign positional vertigo is the most common form of vertigo.  The cause of this condition is not known. It may be caused by a disturbance in an area of the inner ear that helps your brain to sense movement and balance.  Symptoms include loss of balance and falling, feeling that you or your surroundings are moving, nausea and vomiting, and blurred vision.  This condition can be diagnosed based on symptoms, physical exam, and other tests, such as MRI, CT scan, eye movement tests, and hearing tests.  Follow safety instructions as told by your health care provider. You will also be told when to contact your health care provider in case of problems. This information is not intended to replace advice given to you by your health care provider. Make sure you discuss any questions you have with your health care provider. Document Released: 09/08/2006 Document Revised: 05/12/2018 Document Reviewed: 05/12/2018 Elsevier Patient Education  [place].  Gabapentin capsules or tablets What is this medicine? GABAPENTIN (GA ba pen tin) is used to control seizures in certain types of epilepsy. It is also used to  treat certain types of nerve pain. This medicine may be used for other purposes; ask your health care provider or pharmacist if you have questions. COMMON BRAND NAME(S): Active-PAC with Gabapentin, Gabarone, Neurontin What should I tell my health care provider before I take this medicine? They need to know if you have any of these conditions:  history of drug abuse or alcohol abuse problem  kidney disease  lung or breathing disease  suicidal thoughts, plans, or attempt; a previous suicide attempt by you or a family member  an unusual or allergic reaction to gabapentin, other medicines, foods, dyes, or preservatives  pregnant or trying to get pregnant  breast-feeding How should I use this medicine? Take this medicine by mouth with a glass of water. Follow the directions on the prescription label. You can take it with or without food. If it upsets your stomach, take it with food. Take your medicine at regular intervals. Do not take it more often than directed. Do not stop taking except on your doctor's advice. If you are directed to break the 600 or 800 mg tablets in half as part of your dose, the extra half  tablet should be used for the next dose. If you have not used the extra half tablet within 28 days, it should be thrown away. A special MedGuide will be given to you by the pharmacist with each prescription and refill. Be sure to read this information carefully each time. Talk to your pediatrician regarding the use of this medicine in children. While this drug may be prescribed for children as young as 3 years for selected conditions, precautions do apply. Overdosage: If you think you have taken too much of this medicine contact a poison control center or emergency room at once. NOTE: This medicine is only for you. Do not share this medicine with others. What if I miss a dose? If you miss a dose, take it as soon as you can. If it is almost time for your next dose, take only that dose. Do  not take double or extra doses. What may interact with this medicine? This medicine may interact with the following medications:  alcohol  antihistamines for allergy, cough, and cold  certain medicines for anxiety or sleep  certain medicines for depression like amitriptyline, fluoxetine, sertraline  certain medicines for seizures like phenobarbital, primidone  certain medicines for stomach problems  general anesthetics like halothane, isoflurane, methoxyflurane, propofol  local anesthetics like lidocaine, pramoxine, tetracaine  medicines that relax muscles for surgery  narcotic medicines for pain  phenothiazines like chlorpromazine, mesoridazine, prochlorperazine, thioridazine This list may not describe all possible interactions. Give your health care provider a list of all the medicines, herbs, non-prescription drugs, or dietary supplements you use. Also tell them if you smoke, drink alcohol, or use illegal drugs. Some items may interact with your medicine. What should I watch for while using this medicine? Visit your doctor or health care provider for regular checks on your progress. You may want to keep a record at home of how you feel your condition is responding to treatment. You may want to share this information with your doctor or health care provider at each visit. You should contact your doctor or health care provider if your seizures get worse or if you have any new types of seizures. Do not stop taking this medicine or any of your seizure medicines unless instructed by your doctor or health care provider. Stopping your medicine suddenly can increase your seizures or their severity. This medicine may cause serious skin reactions. They can happen weeks to months after starting the medicine. Contact your health care provider right away if you notice fevers or flu-like symptoms with a rash. The rash may be red or purple and then turn into blisters or peeling of the skin. Or, you  might notice a red rash with swelling of the face, lips or lymph nodes in your neck or under your arms. Wear a medical identification bracelet or chain if you are taking this medicine for seizures, and carry a card that lists all your medications. You may get drowsy, dizzy, or have blurred vision. Do not drive, use machinery, or do anything that needs mental alertness until you know how this medicine affects you. To reduce dizzy or fainting spells, do not sit or stand up quickly, especially if you are an older patient. Alcohol can increase drowsiness and dizziness. Avoid alcoholic drinks. Your mouth may get dry. Chewing sugarless gum or sucking hard candy, and drinking plenty of water will help. The use of this medicine may increase the chance of suicidal thoughts or actions. Pay special attention to how you are responding  while on this medicine. Any worsening of mood, or thoughts of suicide or dying should be reported to your health care provider right away. Women who become pregnant while using this medicine may enroll in the [place] Pregnancy Registry by calling [phone]. This registry collects information about the safety of antiepileptic drug use during pregnancy. What side effects may I notice from receiving this medicine? Side effects that you should report to your doctor or health care professional as soon as possible:  allergic reactions like skin rash, itching or hives, swelling of the face, lips, or tongue  breathing problems  rash, fever, and swollen lymph nodes  redness, blistering, peeling or loosening of the skin, including inside the mouth  suicidal thoughts, mood changes Side effects that usually do not require medical attention (report to your doctor or health care professional if they continue or are bothersome):  dizziness  drowsiness  headache  nausea, vomiting  swelling of ankles, feet, hands  tiredness This list may not describe  all possible side effects. Call your doctor for medical advice about side effects. You may report side effects to FDA at 1-800-FDA-1088. Where should I keep my medicine? Keep out of reach of children. This medicine may cause accidental overdose and death if it taken by other adults, children, or pets. Mix any unused medicine with a substance like cat litter or coffee grounds. Then throw the medicine away in a sealed container like a sealed bag or a coffee can with a lid. Do not use the medicine after the expiration date. Store at room temperature between 15 and 30 degrees C (59 and 86 degrees F). NOTE: This sheet is a summary. It may not cover all possible information. If you have questions about this medicine, talk to your doctor, pharmacist, or health care provider.  2020 Elsevier/Gold Standard (2019-03-04 14:16:43)

## 2019-06-30 ENCOUNTER — Telehealth: Payer: Self-pay | Admitting: Neurology

## 2019-06-30 NOTE — Telephone Encounter (Signed)
Pt has called to inform that after reviewing her AVS she has questions re: her   potassium chloride SA (K-DUR,KLOR-CON) 20 MEQ tablet   And how to take it, please call

## 2019-06-30 NOTE — Telephone Encounter (Signed)
I spoke with patient. She does take the potassium chloride 20 meq once daily which is what we have on record. All of her questions were answered. She was advised to contact PCP if she were to have any dosage questions. She verbalized appreciation.

## 2019-07-07 ENCOUNTER — Ambulatory Visit: Payer: Medicare Other | Attending: Neurology

## 2019-07-07 ENCOUNTER — Other Ambulatory Visit: Payer: Self-pay

## 2019-07-07 DIAGNOSIS — M6281 Muscle weakness (generalized): Secondary | ICD-10-CM | POA: Diagnosis not present

## 2019-07-07 DIAGNOSIS — R2689 Other abnormalities of gait and mobility: Secondary | ICD-10-CM | POA: Diagnosis not present

## 2019-07-07 DIAGNOSIS — R296 Repeated falls: Secondary | ICD-10-CM

## 2019-07-07 DIAGNOSIS — H8112 Benign paroxysmal vertigo, left ear: Secondary | ICD-10-CM | POA: Insufficient documentation

## 2019-07-07 DIAGNOSIS — R2681 Unsteadiness on feet: Secondary | ICD-10-CM | POA: Diagnosis not present

## 2019-07-07 NOTE — Therapy (Addendum)
[facility] [address] [place], [place], [address] Phone: [phone]   Fax:  [phone]  Physical Therapy Evaluation  Patient Details  Name: Sydney Ayala MRN: [id number] Date of Birth: 1946-01-25 Referring Provider (PT): Sarina Ill, MD   Encounter Date: 07/07/2019  PT End of Session - 07/07/19 1545    Visit Number  1    Number of Visits  9    Date for PT Re-Evaluation  09/05/19    Authorization Type  Medicare: progress note every 10th visit.    PT Start Time  1102    PT Stop Time  1157    PT Time Calculation (min)  55 min    Equipment Utilized During Treatment  Other (comment)   min guard to S prn   Activity Tolerance  Other (comment)   pt with nausea/dizziness after positional testing   Behavior During Therapy  [facility] for tasks assessed/performed       Past Medical History:  Diagnosis Date  . Anemia   . Arthritis     Back, shoulders, arms  . Atrial fibrillation ([place])   . B12 deficiency   . Chronic low back pain LUMBAR   RELEIVE W/ TENS UNIT PRN AND CHRIOPACTOR  . Chronic venous insufficiency   . Dyspnea   . Dysrhythmia    A Fib during hospital stay 04/03/16  . Ganglion cyst LEFT RING FINGER  . H/O hiatal hernia    pt denies any knowledge of this  . H/O tenderness in limb BILATEARL LEGS AND HEELS DUE TO TENDINITIS AND SPURS  . Heart murmur ASYMPTOMATIC   s/p rheumatic fever  . History of rheumatic fever CHILDHOOD  . Hypothyroidism   . Injury of tendon of long head of right biceps 2018  . Left hand weakness   . Mild acid reflux    pt says she does not have a problem with this  . Motion sickness    cars  . Numbness and tingling BILATERAL ARMS SECONDARY TO CERVICAL PINCHED NERVE  . Peripheral vascular disease (HCC)    legs.   . Pinched nerve in neck   . Short of breath on exertion   . Snores   . Urge urinary incontinence   . Varicose veins    Left Leg    Past Surgical History:   Procedure Laterality Date  . ABDOMINAL HYSTERECTOMY    . CARDIOVASCULAR STRESS TEST  APRIL 2009   NORMAL LVSF/ EF 61%/ NO ISCHEMIA  . COLONOSCOPY WITH PROPOFOL N/A 04/17/2016   Procedure: COLONOSCOPY WITH PROPOFOL;  Surgeon: Lucilla Lame, MD;  Location: [facility];  Service: Endoscopy;  Laterality: N/A;  . DILATION AND CURETTAGE OF UTERUS    . ESOPHAGOGASTRODUODENOSCOPY (EGD) WITH PROPOFOL N/A 04/04/2016   Procedure: ESOPHAGOGASTRODUODENOSCOPY (EGD) WITH PROPOFOL;  Surgeon: Lucilla Lame, MD;  Location: [facility];  Service: Endoscopy;  Laterality: N/A;  . GANGLION CYST EXCISION  06/09/2012   Procedure: REMOVAL GANGLION OF WRIST;  Surgeon: Magnus Sinning, MD;  Location: [facility];  Service: Orthopedics;  Laterality: Left;  EXCISION OF GANGLION CYST LEFT RING FINGER  IV REGIONAL ANESTHESIA  . [place]  . KNEE ARTHROSCOPY WITH MEDIAL MENISECTOMY Left 11/26/2017   Procedure: KNEE ARTHROSCOPY WITH MEDIAL MENISECTOMY;  Surgeon: Corky Mull, MD;  Location: [facility];  Service: Orthopedics;  Laterality: Left;  . LAPAROSCOPIC CHOLECYSTECTOMY  05-13-2002   Gall Bladder  . LEFT BREAST DUCTAL EXCISION  10-11-2009  BENIGN  . LEFT HAND SURG.  1980'S   EXTENSIVE REPAIR AND REMOVAL OF LIGAMENT/TENDON  (?CANCER)  . LUMBAR LAMINECTOMY  1990  &  1976   RIGHT SIDE L4 - 5  & L5 - S1  . POLYPECTOMY  04/17/2016   Procedure: POLYPECTOMY;  Surgeon: Lucilla Lame, MD;  Location: [facility];  Service: Endoscopy;;  . REMOVAL RIGHT GOIN MASS  1980'S   BENIGN  . REMOVAL TOE NAILBED  AGE 25   ALL 10 TOES-- DEFORMED  . RIGHT BREAST LUMPECTOMY     BENIGN  . RIGHT FOOT SURG  X3   REPAIR OF 2 TOES  . ROTATOR CUFF REPAIR  04-11-2008   LEFT SHOULDER  . SPINE SURGERY    . THYROID GOITER REMOVED  1970'S  . TONSILLECTOMY  CHILD  . VAGINAL HYSTERECTOMY/ ANTERIOR & POSTERIOR REPAIR/ TRANSOBTURATOR SLING  04-25-2005   CYSTOCELE/ RECTOCELE/ UTERINE  PROLAPSE/ SUI    There were no vitals filed for this visit.   Subjective Assessment - 07/07/19 1115    Subjective  Pt stated dizziness/black out spells began about 8 months ago. When she performs B shoulder flexion and Cx ext, everything goes black and she feels woozy/dizzy. It quickly stops when pt returns to neutral position. Pt reported she does have dizziness when rolling to the L side. Pt describes dizziness as black out, lightheadedness/woozy not spinning. Pt reported intermittent "high pitch" sound in R ear. Pt denied HA, new onset of weakness, LOH, or pain with dizziness. Pt denied N/V with dizziness. Pt reported she has balance issues, she staggers (hx of LLE N/T and Lx spine decompression) during gait but pt has been told to see a neurologist for peripheral nerve issues.    Pertinent History  Anemia, Arthritis, a-fib, chronic LBP, B12 deficiency, hypothyroidism, venous insufficiency, hiatal hernia, hx of rheumatic fever (with murmur), PVD, urge incontinence, L torn meniscus surgery in 2018, L torn bicep in 2018, intermittent trigger finger (mainly R hand), Lumbar decompression (L4-L5)    Patient Stated Goals  To stop the black out spells and sleep better.    Currently in Pain?  Yes    Pain Score  --   7-8/10   Pain Location  Leg    Pain Orientation  Left    Pain Descriptors / Indicators  Burning    Pain Type  Chronic pain    Pain Onset  More than a month ago    Pain Frequency  Constant    Aggravating Factors   nothing    Pain Relieving Factors  nothing         [facility] PT Assessment - 07/07/19 1127      Assessment   Medical Diagnosis  L BPPV    Referring Provider (PT)  Sarina Ill, MD    Onset Date/Surgical Date  10/15/18   approx. 8 months ago   Hand Dominance  Right    Prior Therapy  none for dizziness      Precautions   Precautions  Fall    Precaution Comments  based on hx.       Restrictions   Weight Bearing Restrictions  No      Balance Screen   Has the  patient fallen in the past 6 months  Yes    How many times?  5   in the last year   Has the patient had a decrease in activity level because of a fear of falling?   Yes   riding horses  Is the patient reluctant to leave their home because of a fear of falling?   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 sway, self corrected with stepping strategy (during turns and STS txfs).     Ambulation Distance (Feet)  100 Feet    Assistive device  None    Gait Pattern  Step-through pattern;Narrow base of support    Ambulation Surface  Level;Indoor    Gait velocity  3.77f/sec. no AD           Vestibular Assessment - 07/07/19 1142      Symptom Behavior   Subjective history of current problem  See subjective assessment    Type of Dizziness   Comment   black out/woozy   Frequency of Dizziness  When B UEs overhead with Cx ext    Duration of Dizziness  As long as BUEs overhead and Cx ext    Symptom Nature  Positional    Aggravating Factors  Rolling to left   B shoulder flex with Cx ext   Relieving Factors  --   change  position   Progression of Symptoms  No change since onset      Oculomotor Exam   Oculomotor Alignment  Normal    Spontaneous  Absent    Gaze-induced   Absent    Smooth Pursuits  Intact    Saccades  Intact    Comment  No dizziness reported. Convergence: reported diplopia at 1.5 inches from nose.      Oculomotor Exam-Fixation Suppressed    Left Head Impulse  Negative    Right Head Impulse  Negative      Vestibulo-Ocular Reflex   VOR 1 Head Only (x 1 viewing)  WNL, no dizziness or corrective saccades.       Positional Testing   Sidelying Test  Sidelying Right;Sidelying Left      Sidelying Right   Sidelying Right Duration  0    Sidelying Right Symptoms  No nystagmus   wooziness returning to upright position     Sidelying Left   Sidelying Left Duration  10 sec.     Sidelying Left Symptoms  Upbeat, left rotatory nystagmus          Objective measurements completed on examination: See above findings.       Vestibular Treatment/Exercise - 07/07/19 1542      Vestibular Treatment/Exercise   Vestibular Treatment Provided  Canalith Repositioning  Canalith Repositioning  Semont Procedure Left Posterior      Semont Procedure Left Posterior   Number of Reps   1    Overall Response  --   see details   Response Details   Pt reported incr. in nausea after L sidelying testing and treatment. Pt drove herself, therefore, PT did not reassess. Pt was able to amb. to check out at end of session. Pt unable to rate dizziness, just stated I feel like I might black out and get sick.             PT Education - 07/07/19 1544    Education Details  PT discussed exam finding, PT POC, duration, and frequency. PT educated pt on the importance of keeping imaging appt., as dizziness appears to be multi-factorial as pt did experience dizziness consistent with BPPV. However, pt's "black out" wooziness s/s more consistent with vascular issues.    Person(s) Educated  Patient    Methods   Explanation    Comprehension  Verbalized understanding       PT Short Term Goals - 07/07/19 1600      PT SHORT TERM GOAL #1   Title  same as LTGs        PT Long Term Goals - 07/07/19 1600      PT LONG TERM GOAL #1   Title  Pt will be IND in HEP to improve dizziness and balance. TARGET DATE FOR ALL LTGS:08/04/19    Time  4    Period  Weeks    Status  New      PT LONG TERM GOAL #2   Title  Perform FGA and write goal as indicated.    Time  4    Period  Weeks    Status  New      PT LONG TERM GOAL #3   Title  Pt will report no dizziness during positional testing in order to perform bed mobility and ADLs safely.    Time  4    Period  Weeks    Status  New      PT LONG TERM GOAL #4   Title  Pt will amb. 1000' over uneven terrain, IND, in order to improve functional mobility.    Time  4    Period  Weeks    Status  New             Plan - 07/07/19 1548    Clinical Impression Statement  Pt is a pleasant 73y/o female presenting to [facility] neuro for L BPPV. Pt's PMH significant for the following: Anemia, Arthritis, a-fib, chronic LBP, B12 deficiency, hypothyroidism, venous insufficiency, hiatal hernia, hx of rheumatic fever (with murmur), PVD, urge incontinence, L torn meniscus surgery in 2018, L torn bicep in 2018, intermittent trigger finger (mainly R hand), Lumbar decompression (L4-L5).  PT did not have pt perform B Dix-Hallpike or Epley's manuever 2/2 pt awaiting MRA and MRI of brain to determine if pt has vascular issues causing black out spells. Pt did experience concordant dizziness (when rolling to L side) and L upbeating rotational nystagmus during L sidelying testing. Therefore, PT had pt perform L Semont but was unable to reassess 2/2 pt's c/o of nausea and incr. dizziness-pt drove herself to appt. Pt's dizziness likely multi-factorial as pt's black out spells (with B overhead reach and Cx spine ext) appear to be vascular in nature (awaiting imaging) but pt's positional  testing consistent with L pBPPV. Pt's gait speed was WNL,however, pt  noted to experience intermittent narrow BOS and incr. postural sway during dynamic gait activities (turns). PT will formally assess balance next session. Please see below for deficits noted upon exam. Strength not formally assessed but weakness also suspected 2/2 gait deviations. Pt would benefit from skilled PT to improve safety and dizziness during functional mobility.    Personal Factors and Comorbidities  Comorbidity 3+    Examination-Activity Limitations  Bed Mobility;Locomotion Level;Reach Overhead;Transfers;Stairs;Stand    Examination-Participation Restrictions  Other   riding horses   Stability/Clinical Decision Making  Evolving/Moderate complexity    Clinical Decision Making  Moderate    Rehab Potential  Good    PT Frequency  2x / week    PT Duration  4 weeks   POC for 60 days, as pt unable to get a f/u appt. for 2 weeks due to full schedules   PT Treatment/Interventions  ADLs/Self Care Home Management;Biofeedback;Canalith Repostioning;DME Instruction;Balance training;Therapeutic exercise;Therapeutic activities;Functional mobility training;Stair training;Gait training;Neuromuscular re-education;Patient/family education;Vestibular    PT Next Visit Plan  Reassess for L pBPPV and treat as indicated (sidelying only and not Cx ext/rot as pt awaiting MRA/MRI of brain to determine if black out spells/wooziness is vascular). Perform FGA and write goal as indicated.       Patient will benefit from skilled therapeutic intervention in order to improve the following deficits and impairments:  Abnormal gait, Decreased balance, Decreased knowledge of use of DME, Decreased mobility, Impaired sensation, Impaired flexibility, Dizziness, Decreased strength, Pain(will not directly treat pain but will monitor closely)  Visit Diagnosis: 1. BPPV (benign paroxysmal positional vertigo), left   2. Unsteadiness on feet   3. Other abnormalities  of gait and mobility   4. Repeated falls   5. Muscle weakness (generalized)        Problem List Patient Active Problem List   Diagnosis Date Noted  . Vitamin B12 deficiency due to intestinal malabsorption 11/20/2016  . Lung nodule, solitary 11/20/2016  . Other fecal abnormalities   . Iron deficiency anemia, unspecified   . Benign neoplasm of descending colon   . Blood in stool   . Intractable cyclical vomiting with nausea   . Acute GI bleeding   . Diarrhea   . Nausea, vomiting and diarrhea 04/03/2016  . Paroxysmal atrial fibrillation with RVR ([place]) 04/03/2016  . Hypothyroidism 04/03/2016  . GERD (gastroesophageal reflux disease) 04/03/2016  . [place] 04/03/2016  . Varicose veins of leg with complications 90/08/2003  . Bleeding from varicose vein 06/22/2015  . Chronic venous insufficiency 06/22/2015    Arcenio Mullaly L 07/07/2019, 4:18 PM  [facility] [address] [place], [place], [address] Phone: [phone]   Fax:  [phone]  Name: Sydney Ayala MRN: [id number] Date of Birth: 10/09/1946  Geoffry Paradise, PT,DPT 07/07/19 4:18 PM Phone: [phone] Fax: [phone]

## 2019-07-26 ENCOUNTER — Ambulatory Visit
Admission: RE | Admit: 2019-07-26 | Discharge: 2019-07-26 | Disposition: A | Discharge status: Home / Self Care | Payer: Medicare Other | Source: Ambulatory Visit | Attending: Neurology | Admitting: Neurology

## 2019-07-26 ENCOUNTER — Other Ambulatory Visit: Payer: Self-pay

## 2019-07-26 DIAGNOSIS — A881 Epidemic vertigo: Secondary | ICD-10-CM | POA: Diagnosis not present

## 2019-07-26 DIAGNOSIS — G450 Vertebro-basilar artery syndrome: Secondary | ICD-10-CM

## 2019-07-26 DIAGNOSIS — H81399 Other peripheral vertigo, unspecified ear: Secondary | ICD-10-CM

## 2019-07-26 DIAGNOSIS — R55 Syncope and collapse: Secondary | ICD-10-CM

## 2019-07-26 DIAGNOSIS — H814 Vertigo of central origin: Secondary | ICD-10-CM

## 2019-07-26 DIAGNOSIS — R42 Dizziness and giddiness: Secondary | ICD-10-CM

## 2019-07-26 MED ORDER — GADOBENATE DIMEGLUMINE 529 MG/ML IV SOLN
14.0000 mL | Freq: Once | INTRAVENOUS | Status: AC | PRN
  Administered 2019-07-26: 14:00:00 14 mL via INTRAVENOUS

## 2019-07-28 ENCOUNTER — Ambulatory Visit: Payer: Medicare Other | Attending: Neurology | Admitting: Rehabilitative and Restorative Service Providers"

## 2019-07-28 ENCOUNTER — Telehealth: Payer: Self-pay | Admitting: *Deleted

## 2019-07-28 ENCOUNTER — Encounter: Payer: Self-pay | Admitting: Rehabilitative and Restorative Service Providers"

## 2019-07-28 ENCOUNTER — Other Ambulatory Visit: Payer: Self-pay

## 2019-07-28 VITALS — BP 174/102 | HR 76

## 2019-07-28 DIAGNOSIS — R2689 Other abnormalities of gait and mobility: Secondary | ICD-10-CM

## 2019-07-28 DIAGNOSIS — H8112 Benign paroxysmal vertigo, left ear: Secondary | ICD-10-CM

## 2019-07-28 DIAGNOSIS — M6281 Muscle weakness (generalized): Secondary | ICD-10-CM | POA: Diagnosis not present

## 2019-07-28 DIAGNOSIS — R2681 Unsteadiness on feet: Secondary | ICD-10-CM

## 2019-07-28 DIAGNOSIS — R296 Repeated falls: Secondary | ICD-10-CM | POA: Diagnosis not present

## 2019-07-28 NOTE — Telephone Encounter (Addendum)
I spoke with the patient and discussed her MRI & MRA results. We had a lengthy conversation and all of her questions were answered. She verbalized understanding. She has not had her carotid dopplers yet. I advised I would send a message to the referrals dept to follow-up. She verbalized appreciation.    ----- Message from Melvenia Beam, MD sent at 07/27/2019  9:24 PM EDT ----- Blood vessels in the head are normal, thanks  Notes recorded by Melvenia Beam, MD on 07/27/2019 at 9:23 PM EDT  MRI of the brain is normal for age thanks

## 2019-07-28 NOTE — Therapy (Signed)
[facility] [address] [place], [place], [address] Phone: [phone]   Fax:  [phone]  Physical Therapy Treatment  Patient Details  Name: Sydney Ayala MRN: [id number] Date of Birth: 1946/04/22 Referring Provider (PT): Sarina Ill, MD  CLINIC OPERATION CHANGES: [facility] is open at lower capacity following universal masking, social distancing, and patient screening.  The patient's COVID risk of complications score is 2.  Encounter Date: 07/28/2019  PT End of Session - 07/28/19 1708    Visit Number  2    Number of Visits  9    Date for PT Re-Evaluation  09/05/19    Authorization Type  Medicare: progress note every 10th visit.    PT Start Time  1705    PT Stop Time  1750    PT Time Calculation (min)  45 min    Activity Tolerance  Other (comment)   pt with nausea/dizziness after positional testing   Behavior During Therapy  [facility] for tasks assessed/performed       Past Medical History:  Diagnosis Date  . Anemia   . Arthritis     Back, shoulders, arms  . Atrial fibrillation ([place])   . B12 deficiency   . Chronic low back pain LUMBAR   RELEIVE W/ TENS UNIT PRN AND CHRIOPACTOR  . Chronic venous insufficiency   . Dyspnea   . Dysrhythmia    A Fib during hospital stay 04/03/16  . Ganglion cyst LEFT RING FINGER  . H/O hiatal hernia    pt denies any knowledge of this  . H/O tenderness in limb BILATEARL LEGS AND HEELS DUE TO TENDINITIS AND SPURS  . Heart murmur ASYMPTOMATIC   s/p rheumatic fever  . History of rheumatic fever CHILDHOOD  . Hypothyroidism   . Injury of tendon of long head of right biceps 2018  . Left hand weakness   . Mild acid reflux    pt says she does not have a problem with this  . Motion sickness    cars  . Numbness and tingling BILATERAL ARMS SECONDARY TO CERVICAL PINCHED NERVE  . Peripheral vascular disease (HCC)    legs.   . Pinched nerve in neck   . Short of breath on  exertion   . Snores   . Urge urinary incontinence   . Varicose veins    Left Leg    Past Surgical History:  Procedure Laterality Date  . ABDOMINAL HYSTERECTOMY    . CARDIOVASCULAR STRESS TEST  APRIL 2009   NORMAL LVSF/ EF 61%/ NO ISCHEMIA  . COLONOSCOPY WITH PROPOFOL N/A 04/17/2016   Procedure: COLONOSCOPY WITH PROPOFOL;  Surgeon: Lucilla Lame, MD;  Location: [facility];  Service: Endoscopy;  Laterality: N/A;  . DILATION AND CURETTAGE OF UTERUS    . ESOPHAGOGASTRODUODENOSCOPY (EGD) WITH PROPOFOL N/A 04/04/2016   Procedure: ESOPHAGOGASTRODUODENOSCOPY (EGD) WITH PROPOFOL;  Surgeon: Lucilla Lame, MD;  Location: [facility];  Service: Endoscopy;  Laterality: N/A;  . GANGLION CYST EXCISION  06/09/2012   Procedure: REMOVAL GANGLION OF WRIST;  Surgeon: Magnus Sinning, MD;  Location: [facility];  Service: Orthopedics;  Laterality: Left;  EXCISION OF GANGLION CYST LEFT RING FINGER  IV REGIONAL ANESTHESIA  . [place]  . KNEE ARTHROSCOPY WITH MEDIAL MENISECTOMY Left 11/26/2017   Procedure: KNEE ARTHROSCOPY WITH MEDIAL MENISECTOMY;  Surgeon: Corky Mull, MD;  Location: [facility];  Service: Orthopedics;  Laterality: Left;  . LAPAROSCOPIC CHOLECYSTECTOMY  05-13-2002   Gall Bladder  . LEFT BREAST DUCTAL EXCISION  10-11-2009   BENIGN  . LEFT HAND SURG.  1980'S   EXTENSIVE REPAIR AND REMOVAL OF LIGAMENT/TENDON  (?CANCER)  . LUMBAR LAMINECTOMY  1990  &  1976   RIGHT SIDE L4 - 5  & L5 - S1  . POLYPECTOMY  04/17/2016   Procedure: POLYPECTOMY;  Surgeon: Lucilla Lame, MD;  Location: [facility];  Service: Endoscopy;;  . REMOVAL RIGHT GOIN MASS  1980'S   BENIGN  . REMOVAL TOE NAILBED  AGE 73   ALL 10 TOES-- DEFORMED  . RIGHT BREAST LUMPECTOMY     BENIGN  . RIGHT FOOT SURG  X3   REPAIR OF 2 TOES  . ROTATOR CUFF REPAIR  04-11-2008   LEFT SHOULDER  . SPINE SURGERY    . THYROID GOITER REMOVED  1970'S  . TONSILLECTOMY  CHILD  .  VAGINAL HYSTERECTOMY/ ANTERIOR & POSTERIOR REPAIR/ TRANSOBTURATOR SLING  04-25-2005   CYSTOCELE/ RECTOCELE/ UTERINE PROLAPSE/ SUI    Vitals:   07/28/19 1742  BP: (!) 174/102  Pulse: 76    Subjective Assessment - 07/28/19 1708    Subjective  The patient reports she had to have a neighbor drive her due to not driving herself.  The patient reports she gets spinning and "blacking out" sensation with rolling in bed.  She reports L leg numbness with burning constantly (describes groin to knee, then 2-3 toes).    Pertinent History  Anemia, Arthritis, a-fib, chronic LBP, B12 deficiency, hypothyroidism, venous insufficiency, hiatal hernia, hx of rheumatic fever (with murmur), PVD, urge incontinence, L torn meniscus surgery in 2018, L torn bicep in 2018, intermittent trigger finger (mainly R hand), Lumbar decompression (L4-L5)    Patient Stated Goals  To stop the black out spells and sleep better.    Currently in Pain?  Yes    Pain Descriptors / Indicators  Burning;Numbness    Pain Type  Chronic pain    Pain Onset  More than a month ago    Pain Frequency  Constant    Aggravating Factors   nothing    Pain Relieving Factors  nothing         [facility] PT Assessment - 07/28/19 1734      Ambulation/Gait   Ambulation/Gait  Yes    Ambulation/Gait Assistance  5: Supervision    Ambulation/Gait Assistance Details  The patient has narrow base of support gait with occasional side stepping and crossover stepping to correct for imbalance.    Ambulation Distance (Feet)  200 Feet    Assistive device  None    Gait Pattern  Step-through pattern;Narrow base of support    Ambulation Surface  Level;Indoor    Gait Comments  *PT was planning to assess FGA, however with short distance gait, the patient had nausea.  Sat to rest.        Functional Gait  Assessment   Gait assessed   --         Vestibular Assessment - 07/28/19 1717      Vestibular Assessment   General Observation  The patient walks into clinic  without device indep.  She requests to remove mask due to nausea wihen she gets vertigo, so PT wore mask + face shield.      Positional Testing   Dix-Hallpike  Dix-Hallpike Left    Sidelying Test  Sidelying Right;Sidelying Left    Horizontal Canal Testing  Horizontal Canal Right;Horizontal Canal Left  Dix-Hallpike Left   Dix-Hallpike Left Duration  8 seconds    Dix-Hallpike Left Symptoms  Upbeat, left rotatory nystagmus      Sidelying Right   Sidelying Right Duration  0    Sidelying Right Symptoms  No nystagmus      Sidelying Left   Sidelying Left Duration  *did sidelying after epley's maneuver and no nystagmus noted, however has general nausea "felt like I was going to get dizzy"    Sidelying Left Symptoms  No nystagmus      Horizontal Canal Right   Horizontal Canal Right Duration  none    Horizontal Canal Right Symptoms  Normal      Horizontal Canal Left   Horizontal Canal Left Duration  none    Horizontal Canal Left Symptoms  Normal               [facility] Adult PT Treatment/Exercise - 07/28/19 1734      Self-Care   Self-Care  Other Self-Care Comments    Other Self-Care Comments   Discussed symptoms and avoidance of L sidelying.  The patient was able to tolerate L sidelying and rolling to the L after CRT.  PT and patient discussed that she should be able to sleep on right or left side.  She still notes some mild nausea after maneuver and may have motion sensitivity (will continue to assess at next visit).      Vestibular Treatment/Exercise - 07/28/19 1727      Vestibular Treatment/Exercise   Vestibular Treatment Provided  Canalith Repositioning    Canalith Repositioning  Epley Manuever Left       EPLEY MANUEVER LEFT   Number of Reps   2    Overall Response   Symptoms Resolved              PT Short Term Goals - 07/07/19 1600      PT SHORT TERM GOAL #1   Title  same as LTGs        PT Long Term Goals - 07/07/19 1600      PT LONG TERM GOAL #1    Title  Pt will be IND in HEP to improve dizziness and balance. TARGET DATE FOR ALL LTGS:08/04/19    Time  4    Period  Weeks    Status  New      PT LONG TERM GOAL #2   Title  Perform FGA and write goal as indicated.    Time  4    Period  Weeks    Status  New      PT LONG TERM GOAL #3   Title  Pt will report no dizziness during positional testing in order to perform bed mobility and ADLs safely.    Time  4    Period  Weeks    Status  New      PT LONG TERM GOAL #4   Title  Pt will amb. 1000' over uneven terrain, IND, in order to improve functional mobility.    Time  4    Period  Weeks    Status  New            Plan - 07/28/19 1728    Clinical Impression Statement  The patient had a positive L dix hallpike test and responded well to Epley's maneuver.  She was able to tolerate L sidelying and rolling after CRT without nystagmus or dizziness.  PT deferred FGA today as patient had nausea and wanted to limit  further mobility.    PT Treatment/Interventions  ADLs/Self Care Home Management;Biofeedback;Canalith Repostioning;DME Instruction;Balance training;Therapeutic exercise;Therapeutic activities;Functional mobility training;Stair training;Gait training;Neuromuscular re-education;Patient/family education;Vestibular    PT Next Visit Plan  CHECK BLOOD PRESSURE; Recheck for BPPV, check FGA, work on safety with gait, add hip abduction to HEP to widen base of support with gait.    Consulted and Agree with Plan of Care  Patient       Patient will benefit from skilled therapeutic intervention in order to improve the following deficits and impairments:  Abnormal gait, Decreased balance, Decreased knowledge of use of DME, Decreased mobility, Impaired sensation, Impaired flexibility, Dizziness, Decreased strength, Pain  Visit Diagnosis: 1. BPPV (benign paroxysmal positional vertigo), left   2. Unsteadiness on feet   3. Other abnormalities of gait and mobility   4. Repeated falls   5. Muscle  weakness (generalized)        Problem List Patient Active Problem List   Diagnosis Date Noted  . Vitamin B12 deficiency due to intestinal malabsorption 11/20/2016  . Lung nodule, solitary 11/20/2016  . Other fecal abnormalities   . Iron deficiency anemia, unspecified   . Benign neoplasm of descending colon   . Blood in stool   . Intractable cyclical vomiting with nausea   . Acute GI bleeding   . Diarrhea   . Nausea, vomiting and diarrhea 04/03/2016  . Paroxysmal atrial fibrillation with RVR ([place]) 04/03/2016  . Hypothyroidism 04/03/2016  . GERD (gastroesophageal reflux disease) 04/03/2016  . [place] 04/03/2016  . Varicose veins of leg with complications 85/27/7824  . Bleeding from varicose vein 06/22/2015  . Chronic venous insufficiency 06/22/2015    Chyenne Sobczak, PT 07/28/2019, 9:11 PM  [facility] [address] [place], [place], [address] Phone: [phone]   Fax:  [phone]  Name: JOHNNETTA HOLSTINE MRN: [id number] Date of Birth: 15-Feb-1946

## 2019-07-28 NOTE — Telephone Encounter (Signed)
Pt has called back asking for RN Romelle Starcher to call her back to discuss concerns about prescribed medication

## 2019-07-28 NOTE — Telephone Encounter (Signed)
I called the pt back. She stated ever since she started the Gabapentin she noticed some confusion and her neighbors have noticed it too. She gave an example of having difficulty in a game she plays with them. She stated it also isn't really helping her leg. I advised this medication is only prn so she does not have to take it if she does not want to. Informed pt I would send a message to Dr. Jaynee Eagles for advice. She verbalized appreciation.

## 2019-08-01 NOTE — Telephone Encounter (Signed)
We started Gabapentin and she feels she sleeps better and her cramps are better and the shooting pain into the thigh isn't as bad. But she feel that maybe the gabapentin is causing some confusion. I recommend stopping the gabapentin and see how she does and then maybe consider very low dose Lyrica. She started Vestibular.

## 2019-08-02 NOTE — Telephone Encounter (Signed)
Sydney Ayala I have tried to figure this out through [employer], can you follow-up? Dr. Ubaldo Glassing at [facility] is who should be doing the test.    They want tell me anything because we did not do the referral . Patient does have a upcoming apt with this Dr. Ubaldo Glassing.

## 2019-08-02 NOTE — Telephone Encounter (Signed)
We are having trouble getting her scheduled and office own;t discuss with [place]. If she wants this test there, Patient has to follow up with her cardiologist (she requested this test be performed his office [facility]) Or we can just order them to be done here in [place].

## 2019-08-04 ENCOUNTER — Encounter: Payer: Self-pay | Admitting: Rehabilitative and Restorative Service Providers"

## 2019-08-04 ENCOUNTER — Other Ambulatory Visit: Payer: Self-pay

## 2019-08-04 ENCOUNTER — Ambulatory Visit: Payer: Medicare Other | Admitting: Rehabilitative and Restorative Service Providers"

## 2019-08-04 DIAGNOSIS — R2689 Other abnormalities of gait and mobility: Secondary | ICD-10-CM

## 2019-08-04 DIAGNOSIS — R2681 Unsteadiness on feet: Secondary | ICD-10-CM

## 2019-08-04 DIAGNOSIS — M6281 Muscle weakness (generalized): Secondary | ICD-10-CM | POA: Diagnosis not present

## 2019-08-04 DIAGNOSIS — R296 Repeated falls: Secondary | ICD-10-CM

## 2019-08-04 DIAGNOSIS — H8112 Benign paroxysmal vertigo, left ear: Secondary | ICD-10-CM

## 2019-08-04 NOTE — Telephone Encounter (Signed)
Noted, thanks so much, Dana.

## 2019-08-04 NOTE — Telephone Encounter (Signed)
Called Dr. Bartholome Bill office back they relayed that Dr. Ubaldo Glassing can order the doppler and see her Monday 08/08/2019 at 9:00 am . Patient is aware and she will be there . I have faxed Dr. Cathren Laine notes Telephone [phone]- fax [phone]

## 2019-08-04 NOTE — Therapy (Signed)
[facility] [address] [place], [place], [address] Phone: [phone]   Fax:  [phone]  Physical Therapy Treatment  Patient Details  Name: Sydney Ayala MRN: [id number] Date of Birth: [birth year] Referring Provider (PT): Sarina Ill, MD  CLINIC OPERATION CHANGES: [facility] is open at lower capacity following universal masking, social distancing, and patient screening.  The patient's COVID risk of complications score is 2.  Encounter Date: 08/04/2019  PT End of Session - 08/04/19 1625    Visit Number  3    Number of Visits  9    Date for PT Re-Evaluation  09/05/19    Authorization Type  Medicare: progress note every 10th visit.    PT Start Time  1618    PT Stop Time  1700    PT Time Calculation (min)  42 min    Activity Tolerance  Other (comment)   pt with nausea/dizziness after positional testing   Behavior During Therapy  [facility] for tasks assessed/performed       Past Medical History:  Diagnosis Date  . Anemia   . Arthritis     Back, shoulders, arms  . Atrial fibrillation ([place])   . B12 deficiency   . Chronic low back pain LUMBAR   RELEIVE W/ TENS UNIT PRN AND CHRIOPACTOR  . Chronic venous insufficiency   . Dyspnea   . Dysrhythmia    A Fib during hospital stay 04/03/16  . Ganglion cyst LEFT RING FINGER  . H/O hiatal hernia    pt denies any knowledge of this  . H/O tenderness in limb BILATEARL LEGS AND HEELS DUE TO TENDINITIS AND SPURS  . Heart murmur ASYMPTOMATIC   s/p rheumatic fever  . History of rheumatic fever CHILDHOOD  . Hypothyroidism   . Injury of tendon of long head of right biceps 2018  . Left hand weakness   . Mild acid reflux    pt says she does not have a problem with this  . Motion sickness    cars  . Numbness and tingling BILATERAL ARMS SECONDARY TO CERVICAL PINCHED NERVE  . Peripheral vascular disease (HCC)    legs.   . Pinched nerve in neck   . Short of breath on  exertion   . Snores   . Urge urinary incontinence   . Varicose veins    Left Leg    Past Surgical History:  Procedure Laterality Date  . ABDOMINAL HYSTERECTOMY    . CARDIOVASCULAR STRESS TEST  APRIL 2009   NORMAL LVSF/ EF 61%/ NO ISCHEMIA  . COLONOSCOPY WITH PROPOFOL N/A 04/17/2016   Procedure: COLONOSCOPY WITH PROPOFOL;  Surgeon: Lucilla Lame, MD;  Location: [facility];  Service: Endoscopy;  Laterality: N/A;  . DILATION AND CURETTAGE OF UTERUS    . ESOPHAGOGASTRODUODENOSCOPY (EGD) WITH PROPOFOL N/A 04/04/2016   Procedure: ESOPHAGOGASTRODUODENOSCOPY (EGD) WITH PROPOFOL;  Surgeon: Lucilla Lame, MD;  Location: [facility];  Service: Endoscopy;  Laterality: N/A;  . GANGLION CYST EXCISION  06/09/2012   Procedure: REMOVAL GANGLION OF WRIST;  Surgeon: Magnus Sinning, MD;  Location: [facility];  Service: Orthopedics;  Laterality: Left;  EXCISION OF GANGLION CYST LEFT RING FINGER  IV REGIONAL ANESTHESIA  . [place]  . KNEE ARTHROSCOPY WITH MEDIAL MENISECTOMY Left 11/26/2017   Procedure: KNEE ARTHROSCOPY WITH MEDIAL MENISECTOMY;  Surgeon: Corky Mull, MD;  Location: [facility];  Service: Orthopedics;  Laterality: Left;  . LAPAROSCOPIC CHOLECYSTECTOMY  05-13-2002   Gall Bladder  . LEFT BREAST DUCTAL EXCISION  10-11-2009   BENIGN  . LEFT HAND SURG.  1980'S   EXTENSIVE REPAIR AND REMOVAL OF LIGAMENT/TENDON  (?CANCER)  . LUMBAR LAMINECTOMY  1990  &  1976   RIGHT SIDE L4 - 5  & L5 - S1  . POLYPECTOMY  04/17/2016   Procedure: POLYPECTOMY;  Surgeon: Lucilla Lame, MD;  Location: [facility];  Service: Endoscopy;;  . REMOVAL RIGHT GOIN MASS  1980'S   BENIGN  . REMOVAL TOE NAILBED  AGE 76   ALL 10 TOES-- DEFORMED  . RIGHT BREAST LUMPECTOMY     BENIGN  . RIGHT FOOT SURG  X3   REPAIR OF 2 TOES  . ROTATOR CUFF REPAIR  04-11-2008   LEFT SHOULDER  . SPINE SURGERY    . THYROID GOITER REMOVED  1970'S  . TONSILLECTOMY  CHILD  .  VAGINAL HYSTERECTOMY/ ANTERIOR & POSTERIOR REPAIR/ TRANSOBTURATOR SLING  04-25-2005   CYSTOCELE/ RECTOCELE/ UTERINE PROLAPSE/ SUI    There were no vitals filed for this visit.  Subjective Assessment - 08/04/19 1620    Subjective  The patient reports she felt bad for 2 days after she left last time.  She has n/v by the time she got home.  She is doing better overall and reports she is not putting her head back.  She notes she could lay on her left side since last visit.  The patient reports that she has monitored BP and last weekend (Sunday) it was 180/102 as her highest measure, and then it was 135/60 at it's lowest.  She denies HA.    Pertinent History  Anemia, Arthritis, a-fib, chronic LBP, B12 deficiency, hypothyroidism, venous insufficiency, hiatal hernia, hx of rheumatic fever (with murmur), PVD, urge incontinence, L torn meniscus surgery in 2018, L torn bicep in 2018, intermittent trigger finger (mainly R hand), Lumbar decompression (L4-L5)    Patient Stated Goals  To stop the black out spells and sleep better.    Currently in Pain?  Yes    Pain Location  Leg    Pain Orientation  Left    Pain Radiating Towards  numbness in thigh    Pain Onset  More than a month ago    Pain Frequency  Constant    Aggravating Factors   describes it as numbness and cramping in hip adductor region    Pain Relieving Factors  nothing         [facility] PT Assessment - 08/04/19 1627      Ambulation/Gait   Ambulation/Gait  Yes    Ambulation/Gait Assistance  5: Supervision    Ambulation/Gait Assistance Details  Narrow base of support, some instability    Ambulation Distance (Feet)  150 Feet    Assistive device  None    Gait Pattern  Step-through pattern;Narrow base of support    Ambulation Surface  Level;Indoor    Gait velocity  2.40 ft/sec      Functional Gait  Assessment   Gait assessed   Yes    Gait Level Surface  Walks 20 ft in less than 7 sec but greater than 5.5 sec, uses assistive device, slower  speed, mild gait deviations, or deviates 6-10 in outside of the 12 in walkway width.    Change in Gait Speed  Makes only minor adjustments to walking speed, or accomplishes a change in speed with significant gait deviations, deviates 10-15 in outside the 12 in walkway width, or changes speed but  loses balance but is able to recover and continue walking.    Gait with Horizontal Head Turns  Performs head turns smoothly with slight change in gait velocity (eg, minor disruption to smooth gait path), deviates 6-10 in outside 12 in walkway width, or uses an assistive device.    Gait with Vertical Head Turns  Performs task with moderate change in gait velocity, slows down, deviates 10-15 in outside 12 in walkway width but recovers, can continue to walk.    Gait and Pivot Turn  Turns slowly, requires verbal cueing, or requires several small steps to catch balance following turn and stop    Step Over Obstacle  Is able to step over one shoe box (4.5 in total height) but must slow down and adjust steps to clear box safely. May require verbal cueing.    Gait with Narrow Base of Support  Ambulates less than 4 steps heel to toe or cannot perform without assistance.    Gait with Eyes Closed  Walks 20 ft, slow speed, abnormal gait pattern, evidence for imbalance, deviates 10-15 in outside 12 in walkway width. Requires more than 9 sec to ambulate 20 ft.    Ambulating Backwards  Walks 20 ft, slow speed, abnormal gait pattern, evidence for imbalance, deviates 10-15 in outside 12 in walkway width.    Steps  Alternating feet, must use rail.    Total Score  12    FGA comment:  12/30         Vestibular Assessment - 08/04/19 1635      Positional Testing   Dix-Hallpike  Dix-Hallpike Right;Dix-Hallpike Left    Horizontal Canal Testing  Horizontal Canal Right;Horizontal Canal Left      Dix-Hallpike Left   Dix-Hallpike Left Duration  4 seconds    Dix-Hallpike Left Symptoms  Upbeat, left rotatory nystagmus       Sidelying Right   Sidelying Right Duration  0    Sidelying Right Symptoms  No nystagmus      Sidelying Left   Sidelying Left Duration  Patient has nausea moving into L sidelying and gets a lightheaded sensation returning to sitting edge of mat.    Sidelying Left Symptoms  No nystagmus      Horizontal Canal Right   Horizontal Canal Right Duration  none    Horizontal Canal Right Symptoms  Normal      Horizontal Canal Left   Horizontal Canal Left Duration  none    Horizontal Canal Left Symptoms  Normal                Vestibular Treatment/Exercise - 08/04/19 1903      Vestibular Treatment/Exercise   Vestibular Treatment Provided  Canalith Repositioning;Habituation    Canalith Repositioning  Epley Manuever Left    Habituation Exercises  Brandt Daroff       EPLEY MANUEVER LEFT   Number of Reps   1    Overall Response   Improved Symptoms      Nestor Lewandowsky   Number of Reps   2    Symptom Description   patient did not have nystagmus after epley's maneuver, however continues to get nausea and has to stop and rest.  PT has not provided this for home due to n/v last visit and nausea today.              PT Short Term Goals - 07/07/19 1600      PT SHORT TERM GOAL #1   Title  same as LTGs  PT Long Term Goals - 08/04/19 1654      PT LONG TERM GOAL #1   Title  Pt will be IND in HEP to improve dizziness and balance. TARGET DATE FOR ALL LTGS:08/04/19    Time  4    Period  Weeks    Status  On-going      PT LONG TERM GOAL #2   Title  Perform FGA and write goal as indicated.    Baseline  FGA=12/30    Time  4    Period  Weeks    Status  Achieved      PT LONG TERM GOAL #3   Title  Pt will report no dizziness during positional testing in order to perform bed mobility and ADLs safely.    Time  4    Period  Weeks    Status  On-going      PT LONG TERM GOAL #4   Title  Pt will amb. 1000' over uneven terrain, IND, in order to improve functional mobility.     Time  4    Period  Weeks    Status  On-going      PT LONG TERM GOAL #5   Title  Improve FGA from 12/30 to > or equal to 18/30    Time  4    Period  Weeks    Status  New            Plan - 08/04/19 1904    Clinical Impression Statement  The patient has been seen for 3 visits and we have not established HEP at this time due to n/v.  PT wants to treat L BPPV and then be able to provide habituation, however habituation increases nausea in therapy and limits further activity.  In addition, the patient has narrow base of support due to hip weakness and this needs to be addressed via HEP.  Will check LTGs and update plan of cae at next session.    PT Treatment/Interventions  ADLs/Self Care Home Management;Biofeedback;Canalith Repostioning;DME Instruction;Balance training;Therapeutic exercise;Therapeutic activities;Functional mobility training;Stair training;Gait training;Neuromuscular re-education;Patient/family education;Vestibular    PT Next Visit Plan  check BP if needed, check LTGs, establish HEP for hip strengthening to decrease narrow base during gait.    Consulted and Agree with Plan of Care  Patient       Patient will benefit from skilled therapeutic intervention in order to improve the following deficits and impairments:  Abnormal gait, Decreased balance, Decreased knowledge of use of DME, Decreased mobility, Impaired sensation, Impaired flexibility, Dizziness, Decreased strength, Pain  Visit Diagnosis: BPPV (benign paroxysmal positional vertigo), left  Unsteadiness on feet  Other abnormalities of gait and mobility  Repeated falls     Problem List Patient Active Problem List   Diagnosis Date Noted  . Vitamin B12 deficiency due to intestinal malabsorption 11/20/2016  . Lung nodule, solitary 11/20/2016  . Other fecal abnormalities   . Iron deficiency anemia, unspecified   . Benign neoplasm of descending colon   . Blood in stool   . Intractable cyclical vomiting with  nausea   . Acute GI bleeding   . Diarrhea   . Nausea, vomiting and diarrhea 04/03/2016  . Paroxysmal atrial fibrillation with RVR ([place]) 04/03/2016  . Hypothyroidism 04/03/2016  . GERD (gastroesophageal reflux disease) 04/03/2016  . [place] 04/03/2016  . Varicose veins of leg with complications 46/50/3546  . Bleeding from varicose vein 06/22/2015  . Chronic venous insufficiency 06/22/2015    Calliope Delangel,PT 08/04/2019, 7:06 PM  [facility] [address] [place], [place], [address] Phone: [phone]   Fax:  [phone]  Name: Sydney Ayala MRN: [id number] Date of Birth: Apr 30, 1946

## 2019-08-08 DIAGNOSIS — R55 Syncope and collapse: Secondary | ICD-10-CM | POA: Diagnosis not present

## 2019-08-08 DIAGNOSIS — I10 Essential (primary) hypertension: Secondary | ICD-10-CM | POA: Diagnosis not present

## 2019-08-08 DIAGNOSIS — I480 Paroxysmal atrial fibrillation: Secondary | ICD-10-CM | POA: Diagnosis not present

## 2019-08-11 ENCOUNTER — Ambulatory Visit: Payer: Medicare Other | Admitting: Rehabilitative and Restorative Service Providers"

## 2019-08-18 ENCOUNTER — Encounter: Payer: Self-pay | Admitting: Rehabilitative and Restorative Service Providers"

## 2019-08-18 ENCOUNTER — Ambulatory Visit: Payer: Medicare Other | Attending: Neurology | Admitting: Rehabilitative and Restorative Service Providers"

## 2019-08-18 ENCOUNTER — Other Ambulatory Visit: Payer: Self-pay

## 2019-08-18 DIAGNOSIS — R2681 Unsteadiness on feet: Secondary | ICD-10-CM

## 2019-08-18 DIAGNOSIS — H8112 Benign paroxysmal vertigo, left ear: Secondary | ICD-10-CM | POA: Diagnosis not present

## 2019-08-18 DIAGNOSIS — R296 Repeated falls: Secondary | ICD-10-CM | POA: Diagnosis not present

## 2019-08-18 DIAGNOSIS — R55 Syncope and collapse: Secondary | ICD-10-CM | POA: Diagnosis not present

## 2019-08-18 DIAGNOSIS — M6281 Muscle weakness (generalized): Secondary | ICD-10-CM | POA: Diagnosis not present

## 2019-08-18 DIAGNOSIS — R2689 Other abnormalities of gait and mobility: Secondary | ICD-10-CM

## 2019-08-18 NOTE — Therapy (Signed)
[facility] [address] [place], [place], [address] Phone: [phone]   Fax:  [phone]  Physical Therapy Treatment and Goal Update  Patient Details  Name: Sydney Ayala MRN: [id number] Date of Birth: [birth year] Referring Provider (PT): Sarina Ill, MD   Encounter Date: 08/18/2019  PT End of Session - 08/18/19 6195    Visit Number  4    Number of Visits  9    Date for PT Re-Evaluation  09/05/19    Authorization Type  Medicare: progress note every 10th visit.    PT Start Time  1616    PT Stop Time  1656    PT Time Calculation (min)  40 min    Activity Tolerance  Patient tolerated treatment well    Behavior During Therapy  [facility] for tasks assessed/performed       Past Medical History:  Diagnosis Date  . Anemia   . Arthritis     Back, shoulders, arms  . Atrial fibrillation ([place])   . B12 deficiency   . Chronic low back pain LUMBAR   RELEIVE W/ TENS UNIT PRN AND CHRIOPACTOR  . Chronic venous insufficiency   . Dyspnea   . Dysrhythmia    A Fib during hospital stay 04/03/16  . Ganglion cyst LEFT RING FINGER  . H/O hiatal hernia    pt denies any knowledge of this  . H/O tenderness in limb BILATEARL LEGS AND HEELS DUE TO TENDINITIS AND SPURS  . Heart murmur ASYMPTOMATIC   s/p rheumatic fever  . History of rheumatic fever CHILDHOOD  . Hypothyroidism   . Injury of tendon of long head of right biceps 2018  . Left hand weakness   . Mild acid reflux    pt says she does not have a problem with this  . Motion sickness    cars  . Numbness and tingling BILATERAL ARMS SECONDARY TO CERVICAL PINCHED NERVE  . Peripheral vascular disease (HCC)    legs.   . Pinched nerve in neck   . Short of breath on exertion   . Snores   . Urge urinary incontinence   . Varicose veins    Left Leg    Past Surgical History:  Procedure Laterality Date  . ABDOMINAL HYSTERECTOMY    . CARDIOVASCULAR STRESS TEST  APRIL 2009   NORMAL  LVSF/ EF 61%/ NO ISCHEMIA  . COLONOSCOPY WITH PROPOFOL N/A 04/17/2016   Procedure: COLONOSCOPY WITH PROPOFOL;  Surgeon: Lucilla Lame, MD;  Location: [facility];  Service: Endoscopy;  Laterality: N/A;  . DILATION AND CURETTAGE OF UTERUS    . ESOPHAGOGASTRODUODENOSCOPY (EGD) WITH PROPOFOL N/A 04/04/2016   Procedure: ESOPHAGOGASTRODUODENOSCOPY (EGD) WITH PROPOFOL;  Surgeon: Lucilla Lame, MD;  Location: [facility];  Service: Endoscopy;  Laterality: N/A;  . GANGLION CYST EXCISION  06/09/2012   Procedure: REMOVAL GANGLION OF WRIST;  Surgeon: Magnus Sinning, MD;  Location: [facility];  Service: Orthopedics;  Laterality: Left;  EXCISION OF GANGLION CYST LEFT RING FINGER  IV REGIONAL ANESTHESIA  . [place]  . KNEE ARTHROSCOPY WITH MEDIAL MENISECTOMY Left 11/26/2017   Procedure: KNEE ARTHROSCOPY WITH MEDIAL MENISECTOMY;  Surgeon: Corky Mull, MD;  Location: [facility];  Service: Orthopedics;  Laterality: Left;  . LAPAROSCOPIC CHOLECYSTECTOMY  05-13-2002   Gall Bladder  . LEFT BREAST DUCTAL EXCISION  10-11-2009   BENIGN  . LEFT HAND SURG.  1980'S   EXTENSIVE REPAIR AND REMOVAL OF LIGAMENT/TENDON  (?  CANCER)  . LUMBAR LAMINECTOMY  1990  &  1976   RIGHT SIDE L4 - 5  & L5 - S1  . POLYPECTOMY  04/17/2016   Procedure: POLYPECTOMY;  Surgeon: Lucilla Lame, MD;  Location: [facility];  Service: Endoscopy;;  . REMOVAL RIGHT GOIN MASS  1980'S   BENIGN  . REMOVAL TOE NAILBED  AGE 73   ALL 10 TOES-- DEFORMED  . RIGHT BREAST LUMPECTOMY     BENIGN  . RIGHT FOOT SURG  X3   REPAIR OF 2 TOES  . ROTATOR CUFF REPAIR  04-11-2008   LEFT SHOULDER  . SPINE SURGERY    . THYROID GOITER REMOVED  1970'S  . TONSILLECTOMY  CHILD  . VAGINAL HYSTERECTOMY/ ANTERIOR & POSTERIOR REPAIR/ TRANSOBTURATOR SLING  04-25-2005   CYSTOCELE/ RECTOCELE/ UTERINE PROLAPSE/ SUI    There were no vitals filed for this visit.  Subjective Assessment - 08/18/19 1621     Subjective  The pateint reports she has been monitoring her blood pressure at home and it is running 140/70 range.  She reports if she looks down at hte counter to do paperwork, she gets dizzy when she comes up and she also gets dizzy when looking back.  She feels laying on the left side is better.    Pertinent History  Anemia, Arthritis, a-fib, chronic LBP, B12 deficiency, hypothyroidism, venous insufficiency, hiatal hernia, hx of rheumatic fever (with murmur), PVD, urge incontinence, L torn meniscus surgery in 2018, L torn bicep in 2018, intermittent trigger finger (mainly R hand), Lumbar decompression (L4-L5)    Patient Stated Goals  To stop the black out spells and sleep better.    Currently in Pain?  Yes   in her hands; is getting trigger finger        [facility] PT Assessment - 08/18/19 1645      Functional Gait  Assessment   Gait assessed   Yes    Gait Level Surface  Walks 20 ft in less than 5.5 sec, no assistive devices, good speed, no evidence for imbalance, normal gait pattern, deviates no more than 6 in outside of the 12 in walkway width.    Change in Gait Speed  Able to smoothly change walking speed without loss of balance or gait deviation. Deviate no more than 6 in outside of the 12 in walkway width.    Gait with Horizontal Head Turns  Performs head turns smoothly with slight change in gait velocity (eg, minor disruption to smooth gait path), deviates 6-10 in outside 12 in walkway width, or uses an assistive device.    Gait with Vertical Head Turns  Performs task with slight change in gait velocity (eg, minor disruption to smooth gait path), deviates 6 - 10 in outside 12 in walkway width or uses assistive device    Gait and Pivot Turn  Pivot turns safely within 3 sec and stops quickly with no loss of balance.    Step Over Obstacle  Is able to step over one shoe box (4.5 in total height) without changing gait speed. No evidence of imbalance.    Gait with Narrow Base of Support  Ambulates  7-9 steps.    Gait with Eyes Closed  Walks 20 ft, slow speed, abnormal gait pattern, evidence for imbalance, deviates 10-15 in outside 12 in walkway width. Requires more than 9 sec to ambulate 20 ft.    Ambulating Backwards  Walks 20 ft, uses assistive device, slower speed, mild gait deviations, deviates 6-10 in outside  12 in walkway width.    Steps  Alternating feet, must use rail.    Total Score  22    FGA comment:  22/30         Vestibular Assessment - 08/18/19 1632      Vestibular Assessment   General Observation  The patient notes her vision feels more fuzzy today      Positional Testing   Dix-Hallpike  Dix-Hallpike Right;Dix-Hallpike Left    Horizontal Canal Testing  Horizontal Canal Right;Horizontal Canal Left      Dix-Hallpike Right   Dix-Hallpike Right Duration  0    Dix-Hallpike Right Symptoms  No nystagmus      Dix-Hallpike Left   Dix-Hallpike Left Duration  trace- 1 to 2 beats of nystagmus viewed with sense dizziness is going to come on, but did not    Dix-Hallpike Left Symptoms  No nystagmus      Horizontal Canal Right   Horizontal Canal Right Duration  0    Horizontal Canal Right Symptoms  Normal      Horizontal Canal Left   Horizontal Canal Left Duration  0    Horizontal Canal Left Symptoms  Normal               [facility] Adult PT Treatment/Exercise - 08/18/19 0001      Exercises   Exercises  Other Exercises    Other Exercises   HEP established for hip abduction x 10 reps, hip extension x 10 reps.  Stretching hamstrings in seated position.      Vestibular Treatment/Exercise - 08/18/19 1633      Vestibular Treatment/Exercise   Vestibular Treatment Provided  Habituation    Habituation Exercises  Legrand Como Daroff   Number of Reps   2    Symptom Description   no dizziness or nausea today with this task.            PT Education - 08/18/19 1916    Education Details  established HEP for LE strengthening    Person(s) Educated   Patient    Methods  Explanation;Demonstration;Handout    Comprehension  Verbalized understanding;Returned demonstration       PT Short Term Goals - 07/07/19 1600      PT SHORT TERM GOAL #1   Title  same as LTGs        PT Long Term Goals - 08/18/19 1643      PT LONG TERM GOAL #1   Title  Pt will be IND in HEP to improve dizziness and balance, general mobility.    Baseline  Established HEP today.    Time  4    Period  Weeks    Status  Revised    Target Date  09/17/19      PT LONG TERM GOAL #2   Title  The patient will be independent on community surfaces x 1000 ft    Baseline  --    Time  4    Period  Weeks    Status  New    Target Date  09/17/19      PT LONG TERM GOAL #3   Title  Pt will report no dizziness during positional testing in order to perform bed mobility and ADLs safely.    Baseline  Patient feels a mild lightheadedness    Time  4    Period  Weeks    Status  Revised    Target Date  09/17/19  PT LONG TERM GOAL #4   Title  .    Time  --    Period  --    Status  --      PT LONG TERM GOAL #5   Title  --    Baseline  --    Time  --    Period  --    Status  --          UPDATED LONG TERM GOALS: PT Long Term Goals - 08/18/19 1643      PT LONG TERM GOAL #1   Title  Pt will be IND in HEP to improve dizziness and balance, general mobility.    Baseline  Established HEP today.    Time  4    Period  Weeks    Status  Revised    Target Date  09/17/19      PT LONG TERM GOAL #2   Title  The patient will be independent on community surfaces x 1000 ft    Baseline  --    Time  4    Period  Weeks    Status  New    Target Date  09/17/19      PT LONG TERM GOAL #3   Title  Pt will report no dizziness during positional testing in order to perform bed mobility and ADLs safely.    Baseline  Patient feels a mild lightheadedness    Time  4    Period  Weeks    Status  Revised    Target Date  09/17/19      PT LONG TERM GOAL #4   Title  .    Time   --    Period  --    Status  --      PT LONG TERM GOAL #5   Title  --    Baseline  --    Time  --    Period  --    Status  --        Plan - 08/18/19 1918    Clinical Impression Statement  The patient showed improvement today and was able to tolerate positional testing without nausea.  She did experience a trace moment of sensation that vertigo would come on with L dix hallpike, but it did not begin.  She tolerated sidelying and rolling today too.  The patient has partially met LTGS, some not met due to dec'd frequency as she has only been able to attend 1x/week and had persistent L BPPV.    Examination-Activity Limitations  Bed Mobility;Locomotion Level;Reach Overhead;Transfers;Stairs;Stand    PT Frequency  1x / week    PT Duration  4 weeks    PT Treatment/Interventions  ADLs/Self Care Home Management;Biofeedback;Canalith Repostioning;DME Instruction;Balance training;Therapeutic exercise;Therapeutic activities;Functional mobility training;Stair training;Gait training;Neuromuscular re-education;Patient/family education;Vestibular    PT Next Visit Plan  check HEP, check dizziness if needed, d/c planning.    Consulted and Agree with Plan of Care  Patient       Patient will benefit from skilled therapeutic intervention in order to improve the following deficits and impairments:  Abnormal gait, Decreased balance, Decreased knowledge of use of DME, Decreased mobility, Impaired sensation, Impaired flexibility, Dizziness, Decreased strength, Pain  Visit Diagnosis: BPPV (benign paroxysmal positional vertigo), left  Unsteadiness on feet  Other abnormalities of gait and mobility  Repeated falls  Muscle weakness (generalized)     Problem List Patient Active Problem List   Diagnosis Date Noted  . Vitamin B12 deficiency due  to intestinal malabsorption 11/20/2016  . Lung nodule, solitary 11/20/2016  . Other fecal abnormalities   . Iron deficiency anemia, unspecified   . Benign  neoplasm of descending colon   . Blood in stool   . Intractable cyclical vomiting with nausea   . Acute GI bleeding   . Diarrhea   . Nausea, vomiting and diarrhea 04/03/2016  . Paroxysmal atrial fibrillation with RVR ([place]) 04/03/2016  . Hypothyroidism 04/03/2016  . GERD (gastroesophageal reflux disease) 04/03/2016  . [place] 04/03/2016  . Varicose veins of leg with complications 25/63/8937  . Bleeding from varicose vein 06/22/2015  . Chronic venous insufficiency 06/22/2015    Naydelin Ziegler, PT 08/18/2019, 7:26 PM  [facility] [address] [place], [place], [address] Phone: [phone]   Fax:  [phone]  Name: ANITTA TENNY MRN: [id number] Date of Birth: [birth year]

## 2019-08-18 NOTE — Addendum Note (Signed)
Addended by: Rudell Cobb M on: 08/18/2019 07:28 PM   Modules accepted: Orders

## 2019-08-18 NOTE — Patient Instructions (Signed)
Access Code: [email or web address]  URL: [email or web address]  Date: 08/18/2019  Prepared by: Rudell Cobb   Exercises Standing Hip Abduction - 10 reps - 1 sets - 5 seconds hold - 2x daily - 7x weekly Standing Hip Extension - 10 reps - 1 sets - 5 seconds hold - 2x daily - 7x weekly Seated Hamstring Stretch with Chair - 3 reps - 1 sets - 30 seconds hold - 2x daily - 7x weekly

## 2019-08-24 ENCOUNTER — Ambulatory Visit: Payer: Medicare Other | Admitting: Rehabilitative and Restorative Service Providers"

## 2019-08-24 ENCOUNTER — Other Ambulatory Visit: Payer: Self-pay

## 2019-08-24 ENCOUNTER — Encounter: Payer: Self-pay | Admitting: Rehabilitative and Restorative Service Providers"

## 2019-08-24 DIAGNOSIS — M6281 Muscle weakness (generalized): Secondary | ICD-10-CM | POA: Diagnosis not present

## 2019-08-24 DIAGNOSIS — H8112 Benign paroxysmal vertigo, left ear: Secondary | ICD-10-CM | POA: Diagnosis not present

## 2019-08-24 DIAGNOSIS — R2681 Unsteadiness on feet: Secondary | ICD-10-CM | POA: Diagnosis not present

## 2019-08-24 DIAGNOSIS — R2689 Other abnormalities of gait and mobility: Secondary | ICD-10-CM

## 2019-08-24 DIAGNOSIS — R296 Repeated falls: Secondary | ICD-10-CM | POA: Diagnosis not present

## 2019-08-24 NOTE — Patient Instructions (Addendum)
Habituation - Tip Card  1.The goal of habituation training is to assist in decreasing symptoms of vertigo, dizziness, or nausea provoked by specific head and body motions. 2.These exercises may initially increase symptoms; however, be persistent and work through symptoms. With repetition and time, the exercises will assist in reducing or eliminating symptoms. 3.Exercises should be stopped and discussed with the therapist if you experience any of the following: - Sudden change or fluctuation in hearing - New onset of ringing in the ears, or increase in current intensity - Any fluid discharge from the ear - Severe pain in neck or back - Extreme nausea  Copyright  VHI. All rights reserved.    SAFETY PRECAUTIONS:  1) Use the bathroom and have a small trash can or bag nearby in case you get sick. 2) Have your phone nearby. 3) After you do this exercise, plan to sit for 15-20 minutes and let everything settle.   Habituation - Sit to Side-Lying   Sit on edge of bed. Lie down onto the right side and hold until dizziness stops, plus 20 seconds.  Return to sitting and wait until dizziness stops, plus 20 seconds.  Repeat to the left side. Repeat sequence 5 times per session. Do 2 sessions per day.  Copyright  VHI. All rights reserved.

## 2019-08-24 NOTE — Therapy (Signed)
[facility] [address] [place], [place], [address] Phone: [phone]   Fax:  [phone]  Physical Therapy Treatment  Patient Details  Name: Sydney Ayala MRN: [id number] Date of Birth: January 14, 1946 Referring Provider (PT): Sarina Ill, MD   Encounter Date: 08/24/2019  PT End of Session - 08/24/19 1227    Visit Number  5    Number of Visits  9    Date for PT Re-Evaluation  09/05/19    Authorization Type  Medicare: progress note every 10th visit.    PT Start Time  1230    PT Stop Time  1315    PT Time Calculation (min)  45 min    Activity Tolerance  Patient tolerated treatment well    Behavior During Therapy  WFL for tasks assessed/performed       Past Medical History:  Diagnosis Date  . Anemia   . Arthritis     Back, shoulders, arms  . Atrial fibrillation ([place])   . B12 deficiency   . Chronic low back pain LUMBAR   RELEIVE W/ TENS UNIT PRN AND CHRIOPACTOR  . Chronic venous insufficiency   . Dyspnea   . Dysrhythmia    A Fib during hospital stay 04/03/16  . Ganglion cyst LEFT RING FINGER  . H/O hiatal hernia    pt denies any knowledge of this  . H/O tenderness in limb BILATEARL LEGS AND HEELS DUE TO TENDINITIS AND SPURS  . Heart murmur ASYMPTOMATIC   s/p rheumatic fever  . History of rheumatic fever CHILDHOOD  . Hypothyroidism   . Injury of tendon of long head of right biceps 2018  . Left hand weakness   . Mild acid reflux    pt says she does not have a problem with this  . Motion sickness    cars  . Numbness and tingling BILATERAL ARMS SECONDARY TO CERVICAL PINCHED NERVE  . Peripheral vascular disease (HCC)    legs.   . Pinched nerve in neck   . Short of breath on exertion   . Snores   . Urge urinary incontinence   . Varicose veins    Left Leg    Past Surgical History:  Procedure Laterality Date  . ABDOMINAL HYSTERECTOMY    . CARDIOVASCULAR STRESS TEST  APRIL 2009   NORMAL LVSF/ EF 61%/  NO ISCHEMIA  . COLONOSCOPY WITH PROPOFOL N/A 04/17/2016   Procedure: COLONOSCOPY WITH PROPOFOL;  Surgeon: Lucilla Lame, MD;  Location: [facility];  Service: Endoscopy;  Laterality: N/A;  . DILATION AND CURETTAGE OF UTERUS    . ESOPHAGOGASTRODUODENOSCOPY (EGD) WITH PROPOFOL N/A 04/04/2016   Procedure: ESOPHAGOGASTRODUODENOSCOPY (EGD) WITH PROPOFOL;  Surgeon: Lucilla Lame, MD;  Location: [facility];  Service: Endoscopy;  Laterality: N/A;  . GANGLION CYST EXCISION  06/09/2012   Procedure: REMOVAL GANGLION OF WRIST;  Surgeon: Magnus Sinning, MD;  Location: [facility];  Service: Orthopedics;  Laterality: Left;  EXCISION OF GANGLION CYST LEFT RING FINGER  IV REGIONAL ANESTHESIA  . [place]  . KNEE ARTHROSCOPY WITH MEDIAL MENISECTOMY Left 11/26/2017   Procedure: KNEE ARTHROSCOPY WITH MEDIAL MENISECTOMY;  Surgeon: Corky Mull, MD;  Location: [facility];  Service: Orthopedics;  Laterality: Left;  . LAPAROSCOPIC CHOLECYSTECTOMY  05-13-2002   Gall Bladder  . LEFT BREAST DUCTAL EXCISION  10-11-2009   BENIGN  . LEFT HAND SURG.  1980'S   EXTENSIVE REPAIR AND REMOVAL OF LIGAMENT/TENDON  (?CANCER)  .  LUMBAR LAMINECTOMY  1990  &  1976   RIGHT SIDE L4 - 5  & L5 - S1  . POLYPECTOMY  04/17/2016   Procedure: POLYPECTOMY;  Surgeon: Lucilla Lame, MD;  Location[facility];  Service: Endoscopy;;  . REMOVAL RIGHT GOIN MASS  1980'S   BENIGN  . REMOVAL TOE NAILBED  AGE 41   ALL 10 TOES-- DEFORMED  . RIGHT BREAST LUMPECTOMY     BENIGN  . RIGHT FOOT SURG  X3   REPAIR OF 2 TOES  . ROTATOR CUFF REPAIR  04-11-2008   LEFT SHOULDER  . SPINE SURGERY    . THYROID GOITER REMOVED  1970'S  . TONSILLECTOMY  CHILD  . VAGINAL HYSTERECTOMY/ ANTERIOR & POSTERIOR REPAIR/ TRANSOBTURATOR SLING  04-25-2005   CYSTOCELE/ RECTOCELE/ UTERINE PROLAPSE/ SUI    There were no vitals filed for this visit.  Subjective Assessment - 08/24/19 1230    Subjective  The  patient reports she got dizzy when she was standing looking down at the countertop.  She reports it is not as bad today, however her neck is sore.  She demonstrates she is able to look up and reach overhead without difficulty.    Pertinent History  Anemia, Arthritis, a-fib, chronic LBP, B12 deficiency, hypothyroidism, venous insufficiency, hiatal hernia, hx of rheumatic fever (with murmur), PVD, urge incontinence, L torn meniscus surgery in 2018, L torn bicep in 2018, intermittent trigger finger (mainly R hand), Lumbar decompression (L4-L5)    Patient Stated Goals  To stop the black out spells and sleep better.    Currently in Pain?  Yes   she had a bad cramp in left inner thigh over the weekend   Pain Score  --   unable to reate today   Pain Location  Leg    Pain Orientation  Left    Pain Descriptors / Indicators  Sore    Pain Onset  More than a month ago    Pain Frequency  Constant    Aggravating Factors   cramping sensation    Pain Relieving Factors  nothing             Vestibular Assessment - 08/24/19 1238      Positional Testing   Dix-Hallpike  Dix-Hallpike Right;Dix-Hallpike Left    Horizontal Canal Testing  Horizontal Canal Right;Horizontal Canal Left      Dix-Hallpike Right   Dix-Hallpike Right Duration  no    Dix-Hallpike Right Symptoms  No nystagmus      Dix-Hallpike Left   Dix-Hallpike Left Duration  8    Dix-Hallpike Left Symptoms  Upbeat, left rotatory nystagmus      Horizontal Canal Right   Horizontal Canal Right Duration  0    Horizontal Canal Right Symptoms  Normal      Horizontal Canal Left   Horizontal Canal Left Duration  5    Horizontal Canal Left Symptoms  Nystagmus   upbeat, left seen in this position              [facility] Adult PT Treatment/Exercise - 08/24/19 1301      Ambulation/Gait   Ambulation/Gait  Yes    Ambulation/Gait Assistance  5: Supervision    Ambulation Distance (Feet)  150 Feet    Assistive device  None    Ambulation  Surface  Level;Indoor      Self-Care   Self-Care  Other Self-Care Comments    Other Self-Care Comments   Discussed self treatment for BPPV and ways to be  safe when performing habituation as part of HEP.      Exercises   Exercises  Other Exercises    Other Exercises   hip adductor stretch bilaterally in standing and then in seated.        Vestibular Treatment/Exercise - 08/24/19 1242      Vestibular Treatment/Exercise   Vestibular Treatment Provided  Canalith Repositioning;Habituation    Canalith Repositioning  Epley Manuever Left    Habituation Exercises  Brandt Daroff       EPLEY MANUEVER LEFT   Number of Reps   2    Overall Response   Symptoms Resolved     RESPONSE DETAILS LEFT  No nystagmus second repetition      Nestor Lewandowsky   Number of Reps   1    Symptom Description   No dizziness or nystagmus             PT Education - 08/24/19 2104    Education Details  Added HEP for habitaution for self mgmt of BPPV    Person(s) Educated  Patient    Methods  Explanation;Demonstration;Handout    Comprehension  Verbalized understanding;Returned demonstration       PT Short Term Goals - 07/07/19 1600      PT SHORT TERM GOAL #1   Title  same as LTGs        PT Long Term Goals - 08/18/19 1643      PT LONG TERM GOAL #1   Title  Pt will be IND in HEP to improve dizziness and balance, general mobility.    Baseline  Established HEP today.    Time  4    Period  Weeks    Status  Revised    Target Date  09/17/19      PT LONG TERM GOAL #2   Title  The patient will be independent on community surfaces x 1000 ft    Baseline  --    Time  4    Period  Weeks    Status  New    Target Date  09/17/19      PT LONG TERM GOAL #3   Title  Pt will report no dizziness during positional testing in order to perform bed mobility and ADLs safely.    Baseline  Patient feels a mild lightheadedness    Time  4    Period  Weeks    Status  Revised    Target Date  09/17/19      PT LONG  TERM GOAL #4   Title  .    Time  --    Period  --    Status  --      PT LONG TERM GOAL #5   Title  --    Baseline  --    Time  --    Period  --    Status  --            Plan - 08/24/19 2109    Clinical Impression Statement  The patient felt a recurrence of BPPV after having multiple days without vertigo.  She presented today with L posterior canal BPPV.  Due to recurrence, PT discussed habituation for HEP.  Due to h/o nausea with treatment, we discussed strategies for safety.    PT Treatment/Interventions  ADLs/Self Care Home Management;Biofeedback;Canalith Repostioning;DME Instruction;Balance training;Therapeutic exercise;Therapeutic activities;Functional mobility training;Stair training;Gait training;Neuromuscular re-education;Patient/family education;Vestibular    PT Next Visit Plan  check for BPPV, progress HEP, d/c planning as appropriate.  Consulted and Agree with Plan of Care  Patient       Patient will benefit from skilled therapeutic intervention in order to improve the following deficits and impairments:  Abnormal gait, Decreased balance, Decreased knowledge of use of DME, Decreased mobility, Impaired sensation, Impaired flexibility, Dizziness, Decreased strength, Pain  Visit Diagnosis: Unsteadiness on feet  BPPV (benign paroxysmal positional vertigo), left  Other abnormalities of gait and mobility     Problem List Patient Active Problem List   Diagnosis Date Noted  . Vitamin B12 deficiency due to intestinal malabsorption 11/20/2016  . Lung nodule, solitary 11/20/2016  . Other fecal abnormalities   . Iron deficiency anemia, unspecified   . Benign neoplasm of descending colon   . Blood in stool   . Intractable cyclical vomiting with nausea   . Acute GI bleeding   . Diarrhea   . Nausea, vomiting and diarrhea 04/03/2016  . Paroxysmal atrial fibrillation with RVR ([place]) 04/03/2016  . Hypothyroidism 04/03/2016  . GERD (gastroesophageal reflux disease)  04/03/2016  . [place] 04/03/2016  . Varicose veins of leg with complications [id number]  . Bleeding from varicose vein 06/22/2015  . Chronic venous insufficiency 06/22/2015    Talina Pleitez, PT 08/24/2019, 9:13 PM  [facility] [address] [place], [place], [address] Phone: [phone]   Fax:  [phone]  Name: Sydney Ayala MRN: [id number] Date of Birth: [birth year]

## 2019-09-01 ENCOUNTER — Ambulatory Visit: Payer: Medicare Other | Admitting: Rehabilitative and Restorative Service Providers"

## 2019-09-02 DIAGNOSIS — D510 Vitamin B12 deficiency anemia due to intrinsic factor deficiency: Secondary | ICD-10-CM | POA: Diagnosis not present

## 2019-09-02 DIAGNOSIS — I4811 Longstanding persistent atrial fibrillation: Secondary | ICD-10-CM | POA: Diagnosis not present

## 2019-09-02 DIAGNOSIS — N3941 Urge incontinence: Secondary | ICD-10-CM | POA: Diagnosis not present

## 2019-09-02 DIAGNOSIS — Z6826 Body mass index (BMI) 26.0-26.9, adult: Secondary | ICD-10-CM | POA: Diagnosis not present

## 2019-09-02 DIAGNOSIS — Z1159 Encounter for screening for other viral diseases: Secondary | ICD-10-CM | POA: Diagnosis not present

## 2019-09-02 DIAGNOSIS — E782 Mixed hyperlipidemia: Secondary | ICD-10-CM | POA: Diagnosis not present

## 2019-09-02 DIAGNOSIS — M720 Palmar fascial fibromatosis [Dupuytren]: Secondary | ICD-10-CM | POA: Diagnosis not present

## 2019-09-02 DIAGNOSIS — E039 Hypothyroidism, unspecified: Secondary | ICD-10-CM | POA: Diagnosis not present

## 2019-09-07 DIAGNOSIS — I480 Paroxysmal atrial fibrillation: Secondary | ICD-10-CM | POA: Diagnosis not present

## 2019-09-07 DIAGNOSIS — I10 Essential (primary) hypertension: Secondary | ICD-10-CM | POA: Diagnosis not present

## 2019-09-07 DIAGNOSIS — R55 Syncope and collapse: Secondary | ICD-10-CM | POA: Diagnosis not present

## 2019-09-09 ENCOUNTER — Ambulatory Visit: Payer: Medicare Other | Admitting: Rehabilitative and Restorative Service Providers"

## 2019-09-09 ENCOUNTER — Other Ambulatory Visit: Payer: Self-pay

## 2019-09-09 ENCOUNTER — Encounter: Payer: Self-pay | Admitting: Rehabilitative and Restorative Service Providers"

## 2019-09-09 DIAGNOSIS — R2689 Other abnormalities of gait and mobility: Secondary | ICD-10-CM | POA: Diagnosis not present

## 2019-09-09 DIAGNOSIS — H8112 Benign paroxysmal vertigo, left ear: Secondary | ICD-10-CM | POA: Diagnosis not present

## 2019-09-09 DIAGNOSIS — R2681 Unsteadiness on feet: Secondary | ICD-10-CM

## 2019-09-09 DIAGNOSIS — M6281 Muscle weakness (generalized): Secondary | ICD-10-CM | POA: Diagnosis not present

## 2019-09-09 DIAGNOSIS — R296 Repeated falls: Secondary | ICD-10-CM | POA: Diagnosis not present

## 2019-09-09 NOTE — Therapy (Addendum)
[facility] [address] [place], [place], [address] Phone: [phone]   Fax:  [phone]  Physical Therapy Treatment and Addended d/c summary  Patient Details  Name: Sydney Ayala MRN: [id number] Date of Birth: [birth year] Referring Provider (PT): Sarina Ill, MD   Encounter Date: 09/09/2019  PT End of Session - 09/09/19 1430    Visit Number  6    Number of Visits  9    Date for PT Re-Evaluation  09/05/19    Authorization Type  Medicare: progress note every 10th visit.    PT Start Time  1022    PT Stop Time  1102    PT Time Calculation (min)  40 min    Activity Tolerance  Patient tolerated treatment well    Behavior During Therapy  WFL for tasks assessed/performed       Past Medical History:  Diagnosis Date  . Anemia   . Arthritis     Back, shoulders, arms  . Atrial fibrillation ([place])   . B12 deficiency   . Chronic low back pain LUMBAR   RELEIVE W/ TENS UNIT PRN AND CHRIOPACTOR  . Chronic venous insufficiency   . Dyspnea   . Dysrhythmia    A Fib during hospital stay 04/03/16  . Ganglion cyst LEFT RING FINGER  . H/O hiatal hernia    pt denies any knowledge of this  . H/O tenderness in limb BILATEARL LEGS AND HEELS DUE TO TENDINITIS AND SPURS  . Heart murmur ASYMPTOMATIC   s/p rheumatic fever  . History of rheumatic fever CHILDHOOD  . Hypothyroidism   . Injury of tendon of long head of right biceps 2018  . Left hand weakness   . Mild acid reflux    pt says she does not have a problem with this  . Motion sickness    cars  . Numbness and tingling BILATERAL ARMS SECONDARY TO CERVICAL PINCHED NERVE  . Peripheral vascular disease (HCC)    legs.   . Pinched nerve in neck   . Short of breath on exertion   . Snores   . Urge urinary incontinence   . Varicose veins    Left Leg    Past Surgical History:  Procedure Laterality Date  . ABDOMINAL HYSTERECTOMY    . CARDIOVASCULAR STRESS TEST  APRIL 2009    NORMAL LVSF/ EF 61%/ NO ISCHEMIA  . COLONOSCOPY WITH PROPOFOL N/A 04/17/2016   Procedure: COLONOSCOPY WITH PROPOFOL;  Surgeon: Lucilla Lame, MD;  Location: [facility];  Service: Endoscopy;  Laterality: N/A;  . DILATION AND CURETTAGE OF UTERUS    . ESOPHAGOGASTRODUODENOSCOPY (EGD) WITH PROPOFOL N/A 04/04/2016   Procedure: ESOPHAGOGASTRODUODENOSCOPY (EGD) WITH PROPOFOL;  Surgeon: Lucilla Lame, MD;  Location: [facility];  Service: Endoscopy;  Laterality: N/A;  . GANGLION CYST EXCISION  06/09/2012   Procedure: REMOVAL GANGLION OF WRIST;  Surgeon: Magnus Sinning, MD;  Location: [facility];  Service: Orthopedics;  Laterality: Left;  EXCISION OF GANGLION CYST LEFT RING FINGER  IV REGIONAL ANESTHESIA  . [place]  . KNEE ARTHROSCOPY WITH MEDIAL MENISECTOMY Left 11/26/2017   Procedure: KNEE ARTHROSCOPY WITH MEDIAL MENISECTOMY;  Surgeon: Corky Mull, MD;  Location: [facility];  Service: Orthopedics;  Laterality: Left;  . LAPAROSCOPIC CHOLECYSTECTOMY  05-13-2002   Gall Bladder  . LEFT BREAST DUCTAL EXCISION  10-11-2009   BENIGN  . LEFT HAND SURG.  1980'S   EXTENSIVE REPAIR AND REMOVAL OF  LIGAMENT/TENDON  (?CANCER)  . LUMBAR LAMINECTOMY  1990  &  1976   RIGHT SIDE L4 - 5  & L5 - S1  . POLYPECTOMY  04/17/2016   Procedure: POLYPECTOMY;  Surgeon: Lucilla Lame, MD;  Location: [facility];  Service: Endoscopy;;  . REMOVAL RIGHT GOIN MASS  1980'S   BENIGN  . REMOVAL TOE NAILBED  AGE 73   ALL 10 TOES-- DEFORMED  . RIGHT BREAST LUMPECTOMY     BENIGN  . RIGHT FOOT SURG  X3   REPAIR OF 2 TOES  . ROTATOR CUFF REPAIR  04-11-2008   LEFT SHOULDER  . SPINE SURGERY    . THYROID GOITER REMOVED  1970'S  . TONSILLECTOMY  CHILD  . VAGINAL HYSTERECTOMY/ ANTERIOR & POSTERIOR REPAIR/ TRANSOBTURATOR SLING  04-25-2005   CYSTOCELE/ RECTOCELE/ UTERINE PROLAPSE/ SUI    There were no vitals filed for this visit.  Subjective Assessment - 09/09/19  1027    Subjective  The patient reports she has not been able to do the exercises daily because they make her dizzy and sick.  She feels dizzy when she bends forward (as when tying her shoe).    Pertinent History  Anemia, Arthritis, a-fib, chronic LBP, B12 deficiency, hypothyroidism, venous insufficiency, hiatal hernia, hx of rheumatic fever (with murmur), PVD, urge incontinence, L torn meniscus surgery in 2018, L torn bicep in 2018, intermittent trigger finger (mainly R hand), Lumbar decompression (L4-L5)    Patient Stated Goals  To stop the black out spells and sleep better.    Currently in Pain?  Yes    Pain Score  --   "stiffness"   Pain Location  Leg    Pain Orientation  Left    Pain Descriptors / Indicators  --   stiffness   Pain Type  Chronic pain    Pain Onset  More than a month ago    Pain Frequency  Constant    Aggravating Factors   numb in front of left leg             Vestibular Assessment - 09/09/19 1029      Vestibular Assessment   General Observation  The patient reports worsening symptoms with bending forwards.   She also notes some dizziness looking up yesterday.      Positional Testing   Dix-Hallpike  Dix-Hallpike Right;Dix-Hallpike Left    Horizontal Canal Testing  Horizontal Canal Right;Horizontal Canal Left      Dix-Hallpike Right   Dix-Hallpike Right Duration  0    Dix-Hallpike Right Symptoms  No nystagmus      Dix-Hallpike Left   Dix-Hallpike Left Duration  7    Dix-Hallpike Left Symptoms  Upbeat, left rotatory nystagmus      Horizontal Canal Right   Horizontal Canal Right Duration  0    Horizontal Canal Right Symptoms  Normal      Horizontal Canal Left   Horizontal Canal Left Duration  0    Horizontal Canal Left Symptoms  Normal               [facility] Adult PT Treatment/Exercise - 09/09/19 1435      Self-Care   Self-Care  Other Self-Care Comments    Other Self-Care Comments   Discussed continuing HEP for LE strengthening and reviewed  brandt daroff habituation to self manage recurrent BPPV.        Vestibular Treatment/Exercise - 09/09/19 0001      Vestibular Treatment/Exercise   Vestibular Treatment Provided  Canalith Repositioning;Habituation  Canalith Repositioning  Epley Manuever Left    Habituation Exercises  Brandt Daroff       EPLEY MANUEVER LEFT   Number of Reps   1    Overall Response   Symptoms Resolved     RESPONSE DETAILS LEFT  Retested using sidelying test to demo brandt daroff for HEP.  No nystagmus or dizziness with L sidelying.       Nestor Lewandowsky   Number of Reps   4    Symptom Description   No dizziness with habituation.  Reviewed need to perform 2x per day x 5 reps and discussed safety precautions.               PT Short Term Goals - 07/07/19 1600      PT SHORT TERM GOAL #1   Title  same as LTGs        PT Long Term Goals - 09/09/19 1107      PT LONG TERM GOAL #1   Title  Pt will be IND in HEP to improve dizziness and balance, general mobility.    Baseline  Established HEP today.    Time  4    Period  Weeks    Status  On-going      PT LONG TERM GOAL #2   Title  The patient will be independent on community surfaces x 1000 ft    Baseline  Per subjective reports of return to walking around property.    Time  4    Period  Weeks    Status  Achieved      PT LONG TERM GOAL #3   Title  Pt will report no dizziness during positional testing in order to perform bed mobility and ADLs safely.    Baseline  The patient initially had positive L dix hallpike.  After treatment today, she was able to tolerate sit<>sidelying without dizziness.    Time  4    Period  Weeks    Status  Partially Met      PT LONG TERM GOAL #4   Title  .            Plan - 09/09/19 1437    Clinical Impression Statement  The patient had L BPPV short duration with first L dix hallpike.  She responded well to L Epley's maneuver with resolution of symptoms when reviewing bilateral sidelying for habituation  for home.  Patient to continue working on home strengthening HEP and use habituation as needed for self mgmt of recurring BPPV.  Reviewed need for regular frequency of intervention to treat symptoms.    PT Treatment/Interventions  ADLs/Self Care Home Management;Biofeedback;Canalith Repostioning;DME Instruction;Balance training;Therapeutic exercise;Therapeutic activities;Functional mobility training;Stair training;Gait training;Neuromuscular re-education;Patient/family education;Vestibular    PT Next Visit Plan  One visit scheduled, patient to call to cancel if symptoms remain resolved.  If patient returns, reassess BPPV, update HEP.    Consulted and Agree with Plan of Care  Patient       Patient will benefit from skilled therapeutic intervention in order to improve the following deficits and impairments:  Abnormal gait, Decreased balance, Decreased knowledge of use of DME, Decreased mobility, Impaired sensation, Impaired flexibility, Dizziness, Decreased strength, Pain  Visit Diagnosis: BPPV (benign paroxysmal positional vertigo), left  Other abnormalities of gait and mobility  Unsteadiness on feet  PHYSICAL THERAPY DISCHARGE SUMMARY  Visits from Start of Care: 6  Current functional level related to goals / functional outcomes: See above.   Remaining deficits: See above  Education / Equipment: Home program, self mgmt  Plan: Patient agrees to discharge.  Patient goals were partially met. Patient is being discharged due to not returning since the last visit.  ?????    *patient called to cancel last session due to feeling better.    Problem List Patient Active Problem List   Diagnosis Date Noted  . Vitamin B12 deficiency due to intestinal malabsorption 11/20/2016  . Lung nodule, solitary 11/20/2016  . Other fecal abnormalities   . Iron deficiency anemia, unspecified   . Benign neoplasm of descending colon   . Blood in stool   . Intractable cyclical vomiting with nausea   .  Acute GI bleeding   . Diarrhea   . Nausea, vomiting and diarrhea 04/03/2016  . Paroxysmal atrial fibrillation with RVR ([place]) 04/03/2016  . Hypothyroidism 04/03/2016  . GERD (gastroesophageal reflux disease) 04/03/2016  . [place] 04/03/2016  . Varicose veins of leg with complications 26/71/2458  . Bleeding from varicose vein 06/22/2015  . Chronic venous insufficiency 06/22/2015    Helen Cuff, PT 09/09/2019, 2:40 PM  [facility] [address] [place], [place], [address] Phone: [phone]   Fax:  [phone]  Name: Sydney Ayala MRN: [id number] Date of Birth: September 20, 1946

## 2019-09-15 DIAGNOSIS — M65321 Trigger finger, right index finger: Secondary | ICD-10-CM | POA: Diagnosis not present

## 2019-09-15 DIAGNOSIS — M65331 Trigger finger, right middle finger: Secondary | ICD-10-CM | POA: Diagnosis not present

## 2019-09-15 DIAGNOSIS — M720 Palmar fascial fibromatosis [Dupuytren]: Secondary | ICD-10-CM | POA: Diagnosis not present

## 2019-09-22 ENCOUNTER — Ambulatory Visit: Payer: Medicare Other | Admitting: Rehabilitative and Restorative Service Providers"

## 2019-09-28 ENCOUNTER — Other Ambulatory Visit: Payer: Self-pay

## 2019-09-28 DIAGNOSIS — M65331 Trigger finger, right middle finger: Secondary | ICD-10-CM | POA: Diagnosis not present

## 2019-09-28 DIAGNOSIS — M720 Palmar fascial fibromatosis [Dupuytren]: Secondary | ICD-10-CM | POA: Diagnosis not present

## 2019-09-28 DIAGNOSIS — M65321 Trigger finger, right index finger: Secondary | ICD-10-CM | POA: Diagnosis not present

## 2019-10-22 DIAGNOSIS — Z23 Encounter for immunization: Secondary | ICD-10-CM | POA: Diagnosis not present

## 2019-11-08 DIAGNOSIS — Z20828 Contact with and (suspected) exposure to other viral communicable diseases: Secondary | ICD-10-CM | POA: Diagnosis not present

## 2019-12-06 DIAGNOSIS — I340 Nonrheumatic mitral (valve) insufficiency: Secondary | ICD-10-CM | POA: Insufficient documentation

## 2020-01-24 ENCOUNTER — Other Ambulatory Visit: Payer: Self-pay | Admitting: Internal Medicine

## 2020-01-24 DIAGNOSIS — D518 Other vitamin B12 deficiency anemias: Secondary | ICD-10-CM

## 2020-01-24 DIAGNOSIS — R911 Solitary pulmonary nodule: Secondary | ICD-10-CM

## 2020-01-24 DIAGNOSIS — D519 Vitamin B12 deficiency anemia, unspecified: Secondary | ICD-10-CM

## 2020-01-24 DIAGNOSIS — R17 Unspecified jaundice: Secondary | ICD-10-CM

## 2020-01-29 ENCOUNTER — Other Ambulatory Visit: Payer: Self-pay | Admitting: Neurology

## 2020-01-31 DIAGNOSIS — M65331 Trigger finger, right middle finger: Secondary | ICD-10-CM | POA: Diagnosis not present

## 2020-01-31 DIAGNOSIS — M65342 Trigger finger, left ring finger: Secondary | ICD-10-CM | POA: Diagnosis not present

## 2020-01-31 DIAGNOSIS — M65341 Trigger finger, right ring finger: Secondary | ICD-10-CM | POA: Diagnosis not present

## 2020-01-31 DIAGNOSIS — M65321 Trigger finger, right index finger: Secondary | ICD-10-CM | POA: Diagnosis not present

## 2020-01-31 DIAGNOSIS — M65352 Trigger finger, left little finger: Secondary | ICD-10-CM | POA: Diagnosis not present

## 2020-01-31 DIAGNOSIS — M720 Palmar fascial fibromatosis [Dupuytren]: Secondary | ICD-10-CM | POA: Diagnosis not present

## 2020-03-01 ENCOUNTER — Ambulatory Visit (INDEPENDENT_AMBULATORY_CARE_PROVIDER_SITE_OTHER): Payer: Medicare Other | Admitting: Legal Medicine

## 2020-03-01 ENCOUNTER — Other Ambulatory Visit: Payer: Self-pay

## 2020-03-01 ENCOUNTER — Encounter: Payer: Self-pay | Admitting: Legal Medicine

## 2020-03-01 VITALS — BP 140/85 | HR 76 | Temp 97.5°F | Ht 66.0 in | Wt 169.0 lb

## 2020-03-01 DIAGNOSIS — E538 Deficiency of other specified B group vitamins: Secondary | ICD-10-CM

## 2020-03-01 DIAGNOSIS — K219 Gastro-esophageal reflux disease without esophagitis: Secondary | ICD-10-CM

## 2020-03-01 DIAGNOSIS — E039 Hypothyroidism, unspecified: Secondary | ICD-10-CM | POA: Diagnosis not present

## 2020-03-01 DIAGNOSIS — G609 Hereditary and idiopathic neuropathy, unspecified: Secondary | ICD-10-CM | POA: Diagnosis not present

## 2020-03-01 DIAGNOSIS — D509 Iron deficiency anemia, unspecified: Secondary | ICD-10-CM | POA: Diagnosis not present

## 2020-03-01 DIAGNOSIS — E782 Mixed hyperlipidemia: Secondary | ICD-10-CM | POA: Diagnosis not present

## 2020-03-01 DIAGNOSIS — I480 Paroxysmal atrial fibrillation: Secondary | ICD-10-CM | POA: Diagnosis not present

## 2020-03-01 DIAGNOSIS — G629 Polyneuropathy, unspecified: Secondary | ICD-10-CM | POA: Insufficient documentation

## 2020-03-01 DIAGNOSIS — K909 Intestinal malabsorption, unspecified: Secondary | ICD-10-CM

## 2020-03-01 NOTE — Assessment & Plan Note (Signed)
Plan of care was formulated today.  She is doing well.  A plan of care was formulated using patient exam, tests and other sources to optimize care using evidence based information.  Recommend no smoking, no eating after supper, avoid fatty foods, elevate Head of bed, avoid tight fitting clothing.  Continue on no medicines.

## 2020-03-01 NOTE — Assessment & Plan Note (Signed)
AN INDIVIDUAL CARE PLAN was established and reinforced today.  The patient's status was assessed using clinical findings on exam, labs, and other diagnostic testing. Patient's success at meeting treatment goals based on disease specific evidence-bassed guidelines and found to be in good control. RECOMMENDATIONS include maintaining present medicines and treatment. 

## 2020-03-01 NOTE — Progress Notes (Signed)
Established Patient Office Visit  Subjective:  Patient ID: Sydney Ayala, female    DOB: 1946-01-12  Age: 74 y.o. MRN: [id number]  CC:  Chief Complaint  Patient presents with  . Hyperlipidemia  . Hypothyroidism  . Atrial Fibrillation    HPI Sydney Ayala presents for Chronic visit.  Patient presents with hyperlipidemia.  Compliance with treatment has been good; patient takes medicines as directed, maintains low cholesterol diet, follows up as directed, and maintains exercise regimen.  Patient is using diet without problems.  Patient has HYPOTHYROIDISM.  Diagnosed 50 years ago.  Patient has stable thyroid readings.  Patient is having no symptoms.  Last TSH was 4.64.  continue dosage of thyroid medicine. She ordinally had graves disease and had thyroidectomy.  Patient has CAF. Stable rhythm and is on anticoagulant without hemorrhage  Past Medical History:  Diagnosis Date  . Anemia   . Arthritis     Back, shoulders, arms  . Atrial fibrillation ([place])   . B12 deficiency   . Chronic low back pain LUMBAR   RELEIVE W/ TENS UNIT PRN AND CHRIOPACTOR  . Chronic venous insufficiency   . Dyspnea   . Dysrhythmia    A Fib during hospital stay 04/03/16  . Ganglion cyst LEFT RING FINGER  . H/O hiatal hernia    pt denies any knowledge of this  . H/O tenderness in limb BILATEARL LEGS AND HEELS DUE TO TENDINITIS AND SPURS  . Heart murmur ASYMPTOMATIC   s/p rheumatic fever  . History of rheumatic fever CHILDHOOD  . Hypothyroidism   . Injury of tendon of long head of right biceps 2018  . Left hand weakness   . Mild acid reflux    pt says she does not have a problem with this  . Motion sickness    cars  . Numbness and tingling BILATERAL ARMS SECONDARY TO CERVICAL PINCHED NERVE  . Peripheral vascular disease (H[facility])    legs.   . Pinched nerve in neck   . Short of breath on exertion   . Snores   . Urge urinary incontinence   . Varicose veins    Left Leg    Past Surgical History:    Procedure Laterality Date  . ABDOMINAL HYSTERECTOMY    . CARDIOVASCULAR STRESS TEST  APRIL 2009   NORMAL LVSF/ EF 61%/ NO ISCHEMIA  . COLONOSCOPY WITH PROPOFOL N/A 04/17/2016   Procedure: COLONOSCOPY WITH PROPOFOL;  Surgeon: Lucilla Lame, MD;  Location: [facility];  Service: Endoscopy;  Laterality: N/A;  . DILATION AND CURETTAGE OF UTERUS    . ESOPHAGOGASTRODUODENOSCOPY (EGD) WITH PROPOFOL N/A 04/04/2016   Procedure: ESOPHAGOGASTRODUODENOSCOPY (EGD) WITH PROPOFOL;  Surgeon: Lucilla Lame, MD;  Location: [facility];  Service: Endoscopy;  Laterality: N/A;  . GANGLION CYST EXCISION  06/09/2012   Procedure: REMOVAL GANGLION OF WRIST;  Surgeon: Magnus Sinning, MD;  Location: [place];  Service: Orthopedics;  Laterality: Left;  EXCISION OF GANGLION CYST LEFT RING FINGER  IV REGIONAL ANESTHESIA  . [place] [place]  . KNEE ARTHROSCOPY WITH MEDIAL MENISECTOMY Left 11/26/2017   Procedure: KNEE ARTHROSCOPY WITH MEDIAL MENISECTOMY;  Surgeon: Corky Mull, MD;  Location: [facility];  Service: Orthopedics;  Laterality: Left;  . LAPAROSCOPIC CHOLECYSTECTOMY  05-13-2002   Gall Bladder  . LEFT BREAST DUCTAL EXCISION  10-11-2009   BENIGN  . LEFT HAND SURG.  1980'S   EXTENSIVE REPAIR AND REMOVAL OF LIGAMENT/TENDON  (?CANCER)  . LUMBAR  LAMINECTOMY  1990  &  1976   RIGHT SIDE L4 - 5  & L5 - S1  . POLYPECTOMY  04/17/2016   Procedure: POLYPECTOMY;  Surgeon: Lucilla Lame, MD;  Location: [facility];  Service: Endoscopy;;  . REMOVAL RIGHT GOIN MASS  1980'S   BENIGN  . REMOVAL TOE NAILBED  AGE 69   ALL 10 TOES-- DEFORMED  . RIGHT BREAST LUMPECTOMY     BENIGN  . RIGHT FOOT SURG  X3   REPAIR OF 2 TOES  . ROTATOR CUFF REPAIR  04-11-2008   LEFT SHOULDER  . SPINE SURGERY    . THYROID GOITER REMOVED  1970'S  . TONSILLECTOMY  CHILD  . VAGINAL HYSTERECTOMY/ ANTERIOR & POSTERIOR REPAIR/ TRANSOBTURATOR SLING  04-25-2005   CYSTOCELE/ RECTOCELE/ UTERINE  PROLAPSE/ SUI    Family History  Problem Relation Age of Onset  . Deep vein thrombosis Mother        Right Leg  . Heart disease Mother        Before age 82-  CHF  . Varicose Veins Mother   . Heart attack Mother   . Heart disease Father        Before age 59-  PVD  . Heart attack Father   . Arthritis Father        Gout  . Hypertension Father   . Cancer Sister        Thyroid-Back  . Cancer Sister        Breast and Lung  . Heart disease Sister   . Heart disease Sister   . Other Sister        platelet disorder  . Colon cancer Maternal Grandmother   . Breast cancer Maternal Grandmother   . Heart attack Other        father's side multiple family members  . Stroke Other        father's side multiple family members  . Colon cancer Maternal Uncle   . Diabetes Maternal Uncle   . Diabetes Maternal Aunt     Social History   Socioeconomic History  . Marital status: Divorced    Spouse name: Not on file  . Number of children: 3  . Years of education: Not on file  . Highest education level: 8th grade  Occupational History  . Not on file  Tobacco Use  . Smoking status: Former Smoker    Years: 40.00    Types: Cigarettes    Quit date: 1993    Years since quitting: 28.2  . Smokeless tobacco: Never Used  Substance and Sexual Activity  . Alcohol use: Never  . Drug use: Never  . Sexual activity: Not on file  Other Topics Concern  . Not on file  Social History Narrative   Lives alone   Retired   Caffeine: coffee 3 cups daily   Social Determinants of Health   Financial Resource Strain:   . Difficulty of Paying Living Expenses:   Food Insecurity:   . Worried About Charity fundraiser in the Last Year:   . Arboriculturist in the Last Year:   Transportation Needs:   . Film/video editor (Medical):   Marland Kitchen Lack of Transportation (Non-Medical):   Physical Activity:   . Days of Exercise per Week:   . Minutes of Exercise per Session:   Stress:   . Feeling of Stress :     Social Connections:   . Frequency of Communication with Friends and Family:   .  Frequency of Social Gatherings with Friends and Family:   . Attends Religious Services:   . Active Member of Clubs or Organizations:   . Attends Archivist Meetings:   Marland Kitchen Marital Status:   Intimate Partner Violence:   . Fear of Current or Ex-Partner:   . Emotionally Abused:   Marland Kitchen Physically Abused:   . Sexually Abused:     Outpatient Medications Prior to Visit  Medication Sig Dispense Refill  . acetaminophen (TYLENOL) 500 MG tablet Take 1,000 mg by mouth every 8 (eight) hours as needed for mild pain.    . cyanocobalamin (,VITAMIN B-12,) 1000 MCG/ML injection INJECT 1 ML MONTHLY 3 mL 3  . diltiazem (CARDIZEM CD) 120 MG 24 hr capsule TAKE ONE CAPSULE BY MOUTH EVERY DAY    . gabapentin (NEURONTIN) 100 MG capsule TAKE 1 CAPSULE (100 MG TOTAL) BY MOUTH AT BEDTIME AS NEEDED. 30 capsule 6  . Multiple Vitamins-Minerals ([employer] SPECTRAVITE ADULT 50+) TABS Take 1 tablet by mouth daily.    . naproxen (NAPROSYN) 500 MG tablet Take 500 mg by mouth daily.    Marland Kitchen PARoxetine (PAXIL) 10 MG tablet Take 10 mg by mouth daily.    . potassium chloride SA (K-DUR,KLOR-CON) 20 MEQ tablet Take 1 tablet (20 mEq total) by mouth once. (Patient taking differently: Take 20 mEq by mouth daily. ) 30 tablet 3  . Probiotic Product (DIGESTIVE ADVANTAGE PO) Take 1 each by mouth every other day.    Marland Kitchen SYNTHROID 125 MCG tablet Take 125 mcg by mouth daily.    . Syringe/Needle, Disp, (SYRINGE 3CC/22GX1") 22G X 1" 3 ML MISC 1 Syringe by Does not apply route once. Please dispense syringe/needle for b12 injections. Pt will self administer her own injections. 50 each 4  . TUBERCULIN SYR 1CC/25GX5/8" (B-D TB SYRINGE 1CC/25GX5/8") 25G X 5/8" 1 ML MISC Once a month 12 each 0  . Vitamins/Minerals TABS Take by mouth.    . diltiazem (TIAZAC) 120 MG 24 hr capsule TAKE 1 CAPSULE BY MOUTH ONCE DAILY    . gabapentin (NEURONTIN) 100 MG capsule Take by mouth.     . potassium chloride SA (KLOR-CON M20) 20 MEQ tablet TAKE 1 TABLET BY MOUTH EVERY DAY    . PARoxetine (PAXIL) 10 MG tablet Take by mouth.    . SYNTHROID 100 MCG tablet Take 125 mcg by mouth daily.   4   No facility-administered medications prior to visit.    Allergies  Allergen Reactions  . Latex Rash    Pt denies.  Reports allergy to adhesives, not latex  . Codeine Nausea And Vomiting  . Contrast Media [Iodinated Diagnostic Agents] Nausea And Vomiting and Other (See Comments)    "CONVULSIONS"  . Indomethacin Nausea And Vomiting and Other (See Comments)    "CONVULSIONS"  . Adhesive [Tape] Rash and Other (See Comments)    BURNS SKIN    ROS Review of Systems  Constitutional: Negative.   HENT: Negative.   Eyes: Negative.   Respiratory: Negative.   Cardiovascular: Negative.   Gastrointestinal: Negative.   Endocrine: Negative.   Genitourinary: Negative.   Musculoskeletal: Negative.   Skin: Negative.   Neurological: Negative.   Hematological: Negative.   Psychiatric/Behavioral: Negative.       Objective:    Physical Exam  Constitutional: She is oriented to person, place, and time. She appears well-developed and well-nourished.  HENT:  Head: Normocephalic and atraumatic.  Eyes: Pupils are equal, round, and reactive to light. Conjunctivae and EOM are normal.  Cardiovascular: Normal rate.  irregular irregular rhythm  Pulmonary/Chest: Effort normal and breath sounds normal.  Abdominal: Soft. Bowel sounds are normal.  Musculoskeletal:        General: Normal range of motion.     Cervical back: Normal range of motion and neck supple.  Neurological: She is alert and oriented to person, place, and time. She has normal strength and normal reflexes. A sensory deficit is present. She displays a negative Romberg sign.  Reflex Scores:      Tricep reflexes are 2+ on the right side and 2+ on the left side.      Bicep reflexes are 2+ on the right side and 2+ on the left side.       Brachioradialis reflexes are 2+ on the right side and 2+ on the left side.      Patellar reflexes are 2+ on the right side and 2+ on the left side.      Achilles reflexes are 2+ on the right side and 2+ on the left side. C/o numbness left leg and in arms.  Decreased light touch both legs, negative monofilament sensation in feet  Skin: Skin is warm and dry.  Psychiatric: She has a normal mood and affect.  Vitals reviewed.   BP 140/85   Pulse 76   Temp (!) 97.5 F (36.4 C)   Ht 5\' 6"  (1.676 m)   Wt 169 lb (76.7 kg)   SpO2 93%   BMI 27.28 kg/m  Wt Readings from Last 3 Encounters:  03/01/20 169 lb (76.7 kg)  06/28/19 158 lb (71.7 kg)  01/03/19 155 lb 14.4 oz (70.7 kg)     Health Maintenance Due  Topic Date Due  . TETANUS/TDAP  Never done  . DEXA SCAN  Never done  . PNA vac Low Risk Adult (2 of 2 - PPSV23) 11/23/2019  . MAMMOGRAM  01/16/2020    There are no preventive care reminders to display for this patient.  Lab Results  Component Value Date   TSH 6.561 (H) 04/03/2016   Lab Results  Component Value Date   WBC 6.2 01/03/2019   HGB 14.1 01/03/2019   HCT 44.2 01/03/2019   MCV 91.3 01/03/2019   PLT 239 01/03/2019   Lab Results  Component Value Date   NA 141 01/03/2019   K 4.4 01/03/2019   CO2 27 01/03/2019   GLUCOSE 94 01/03/2019   BUN 25 (H) 01/03/2019   CREATININE 0.74 01/03/2019   BILITOT 0.8 01/03/2019   ALKPHOS 86 01/03/2019   AST 21 01/03/2019   ALT 17 01/03/2019   PROT 7.5 01/03/2019   ALBUMIN 4.4 01/03/2019   CALCIUM 9.4 01/03/2019   ANIONGAP 10 01/03/2019   No results found for: CHOL No results found for: HDL No results found for: LDLCALC No results found for: TRIG No results found for: CHOLHDL No results found for: HGBA1C    Assessment & Plan:   Problem List Items Addressed This Visit      Cardiovascular and Mediastinum   Paroxysmal atrial fibrillation with RVR ([place])    PAF is stable and patient is doing well.  Rate is controlled.         Digestive   GERD (gastroesophageal reflux disease)    Plan of care was formulated today.  She is doing well.  A plan of care was formulated using patient exam, tests and other sources to optimize care using evidence based information.  Recommend no smoking, no eating after supper, avoid fatty foods,  elevate Head of bed, avoid tight fitting clothing.  Continue on no medicines.      Vitamin B12 deficiency due to intestinal malabsorption - Primary   Relevant Orders   Vitamin B12     Endocrine   Hypothyroidism    AN INDIVIDUAL CARE PLAN was established and reinforced today.  The patient's status was assessed using clinical findings on exam, labs, and other diagnostic testing. Patient's success at meeting treatment goals based on disease specific evidence-bassed guidelines and found to be in good control. RECOMMENDATIONS include maintaining present medicines and treatment.      Relevant Medications   SYNTHROID 125 MCG tablet   Other Relevant Orders   Nerve conduction test   TSH     Nervous and Auditory   Peripheral neuropathy    Patient has peripheral neuropathy and it is under good control Gabapentin      Relevant Orders   CBC with Differential   Comprehensive metabolic panel     Other   Iron deficiency anemia, unspecified    Patient has iron deficiency and is on supplements.       Other Visit Diagnoses    Mixed hyperlipidemia       Relevant Orders   Lipid Panel      No orders of the defined types were placed in this encounter.   Follow-up: Return in about 4 months (around 07/01/2020) for fasting.    Reinaldo Meeker, MD

## 2020-03-01 NOTE — Assessment & Plan Note (Signed)
Patient has iron deficiency and is on supplements.

## 2020-03-01 NOTE — Assessment & Plan Note (Signed)
PAF is stable and patient is doing well.  Rate is controlled.

## 2020-03-01 NOTE — Assessment & Plan Note (Signed)
Patient has peripheral neuropathy and it is under good control Gabapentin

## 2020-03-02 ENCOUNTER — Other Ambulatory Visit: Payer: Self-pay | Admitting: Legal Medicine

## 2020-03-02 LAB — CBC WITH DIFFERENTIAL/PLATELET
Basophils Absolute: 0 10*3/uL (ref 0.0–0.2)
Basos: 1 %
EOS (ABSOLUTE): 0.4 10*3/uL (ref 0.0–0.4)
Eos: 6 %
Hematocrit: 43 % (ref 34.0–46.6)
Hemoglobin: 14.8 g/dL (ref 11.1–15.9)
Immature Grans (Abs): 0 10*3/uL (ref 0.0–0.1)
Immature Granulocytes: 0 %
Lymphocytes Absolute: 1.9 10*3/uL (ref 0.7–3.1)
Lymphs: 31 %
MCH: 31.5 pg (ref 26.6–33.0)
MCHC: 34.4 g/dL (ref 31.5–35.7)
MCV: 92 fL (ref 79–97)
Monocytes Absolute: 0.7 10*3/uL (ref 0.1–0.9)
Monocytes: 11 %
Neutrophils Absolute: 3.3 10*3/uL (ref 1.4–7.0)
Neutrophils: 51 %
Platelets: 228 10*3/uL (ref 150–450)
RBC: 4.7 x10E6/uL (ref 3.77–5.28)
RDW: 12.7 % (ref 11.7–15.4)
WBC: 6.3 10*3/uL (ref 3.4–10.8)

## 2020-03-02 LAB — LIPID PANEL
Chol/HDL Ratio: 2.9 ratio (ref 0.0–4.4)
Cholesterol, Total: 184 mg/dL (ref 100–199)
HDL: 64 mg/dL (ref >39)
LDL Chol Calc (NIH): 104 mg/dL — ABNORMAL HIGH (ref 0–99)
Triglycerides: 85 mg/dL (ref 0–149)
VLDL Cholesterol Cal: 16 mg/dL (ref 5–40)

## 2020-03-02 LAB — COMPREHENSIVE METABOLIC PANEL
ALT: 16 IU/L (ref 0–32)
AST: 24 IU/L (ref 0–40)
Albumin/Globulin Ratio: 2 (ref 1.2–2.2)
Albumin: 4.6 g/dL (ref 3.7–4.7)
Alkaline Phosphatase: 114 IU/L (ref 39–117)
BUN/Creatinine Ratio: 29 — ABNORMAL HIGH (ref 12–28)
BUN: 25 mg/dL (ref 8–27)
Bilirubin Total: 0.4 mg/dL (ref 0.0–1.2)
CO2: 25 mmol/L (ref 20–29)
Calcium: 9.9 mg/dL (ref 8.7–10.3)
Chloride: 103 mmol/L (ref 96–106)
Creatinine, Ser: 0.87 mg/dL (ref 0.57–1.00)
GFR calc Af Amer: 78 mL/min/{1.73_m2} (ref >59)
GFR calc non Af Amer: 67 mL/min/{1.73_m2} (ref >59)
Globulin, Total: 2.3 g/dL (ref 1.5–4.5)
Glucose: 87 mg/dL (ref 65–99)
Potassium: 4.9 mmol/L (ref 3.5–5.2)
Sodium: 142 mmol/L (ref 134–144)
Total Protein: 6.9 g/dL (ref 6.0–8.5)

## 2020-03-02 LAB — CARDIOVASCULAR RISK ASSESSMENT

## 2020-03-02 LAB — TSH: TSH: 2.21 u[IU]/mL (ref 0.450–4.500)

## 2020-03-02 LAB — VITAMIN B12: Vitamin B-12: 662 pg/mL (ref 232–1245)

## 2020-03-02 NOTE — Progress Notes (Signed)
CBC normal, Kidney and liver tests normal. TSH 2.21 normal, B12 662 normal, LDL cholesterol 104 need to watch diet lp

## 2020-03-05 ENCOUNTER — Telehealth: Payer: Self-pay

## 2020-03-05 NOTE — Telephone Encounter (Signed)
Patient returned call and declined AWV.   She is due for DEXA Scan and Mammogram

## 2020-03-05 NOTE — Telephone Encounter (Signed)
Left message for patient to call and schedule a Medicare AWV.

## 2020-03-06 DIAGNOSIS — M65332 Trigger finger, left middle finger: Secondary | ICD-10-CM | POA: Diagnosis not present

## 2020-03-06 DIAGNOSIS — M65342 Trigger finger, left ring finger: Secondary | ICD-10-CM | POA: Diagnosis not present

## 2020-03-06 DIAGNOSIS — M65341 Trigger finger, right ring finger: Secondary | ICD-10-CM | POA: Diagnosis not present

## 2020-03-06 DIAGNOSIS — M65321 Trigger finger, right index finger: Secondary | ICD-10-CM | POA: Diagnosis not present

## 2020-03-06 DIAGNOSIS — M720 Palmar fascial fibromatosis [Dupuytren]: Secondary | ICD-10-CM | POA: Diagnosis not present

## 2020-03-06 DIAGNOSIS — M65352 Trigger finger, left little finger: Secondary | ICD-10-CM | POA: Diagnosis not present

## 2020-03-29 ENCOUNTER — Other Ambulatory Visit: Payer: Self-pay | Admitting: Internal Medicine

## 2020-05-28 ENCOUNTER — Other Ambulatory Visit: Payer: Self-pay | Admitting: Legal Medicine

## 2020-06-11 DIAGNOSIS — D509 Iron deficiency anemia, unspecified: Secondary | ICD-10-CM | POA: Insufficient documentation

## 2020-06-12 ENCOUNTER — Other Ambulatory Visit: Payer: Self-pay | Admitting: Cardiology

## 2020-06-24 ENCOUNTER — Other Ambulatory Visit: Payer: Self-pay | Admitting: Legal Medicine

## 2020-06-24 DIAGNOSIS — J209 Acute bronchitis, unspecified: Secondary | ICD-10-CM | POA: Diagnosis not present

## 2020-07-03 ENCOUNTER — Other Ambulatory Visit: Payer: Self-pay

## 2020-07-03 ENCOUNTER — Ambulatory Visit (INDEPENDENT_AMBULATORY_CARE_PROVIDER_SITE_OTHER): Payer: Medicare Other | Admitting: Legal Medicine

## 2020-07-03 ENCOUNTER — Encounter: Payer: Self-pay | Admitting: Legal Medicine

## 2020-07-03 VITALS — BP 140/84 | HR 72 | Temp 97.2°F | Resp 18 | Ht 66.0 in | Wt 160.2 lb

## 2020-07-03 DIAGNOSIS — D519 Vitamin B12 deficiency anemia, unspecified: Secondary | ICD-10-CM | POA: Diagnosis not present

## 2020-07-03 DIAGNOSIS — D518 Other vitamin B12 deficiency anemias: Secondary | ICD-10-CM

## 2020-07-03 DIAGNOSIS — E038 Other specified hypothyroidism: Secondary | ICD-10-CM | POA: Diagnosis not present

## 2020-07-03 DIAGNOSIS — J209 Acute bronchitis, unspecified: Secondary | ICD-10-CM | POA: Diagnosis not present

## 2020-07-03 DIAGNOSIS — R17 Unspecified jaundice: Secondary | ICD-10-CM

## 2020-07-03 DIAGNOSIS — G609 Hereditary and idiopathic neuropathy, unspecified: Secondary | ICD-10-CM | POA: Diagnosis not present

## 2020-07-03 DIAGNOSIS — I482 Chronic atrial fibrillation, unspecified: Secondary | ICD-10-CM

## 2020-07-03 DIAGNOSIS — E538 Deficiency of other specified B group vitamins: Secondary | ICD-10-CM | POA: Diagnosis not present

## 2020-07-03 DIAGNOSIS — I480 Paroxysmal atrial fibrillation: Secondary | ICD-10-CM

## 2020-07-03 DIAGNOSIS — K909 Intestinal malabsorption, unspecified: Secondary | ICD-10-CM | POA: Diagnosis not present

## 2020-07-03 MED ORDER — PREDNISONE 10 MG (21) PO TBPK: ORAL_TABLET | ORAL | 0 refills | Status: DC

## 2020-07-03 MED ORDER — ALBUTEROL SULFATE HFA 108 (90 BASE) MCG/ACT IN AERS: 2.0000 | INHALATION_SPRAY | Freq: Four times a day (QID) | RESPIRATORY_TRACT | 3 refills | Status: DC | PRN

## 2020-07-03 NOTE — Progress Notes (Signed)
Subjective:  Patient ID: Sydney Ayala, female    DOB: 1946-03-11  Age: 74 y.o. MRN: [id number]  Chief Complaint  Patient presents with  . Hypothyroidism  . Hyperlipidemia    HPI: chronic visit  Patient presents with hyperlipidemia.  Compliance with treatment has been good; patient takes medicines as directed, maintains low cholesterol diet, follows up as directed, and maintains exercise regimen.  Patient is using diet without problems.  Patient has gastroesophageal reflux symptoms withoutesophagitis and LTRD.  The symptoms are mild intensity.  Length of symptoms 10 years.  Medicines include none.  Complications include GI bleed.  Patient has HYPOTHYROIDISM.  Diagnosed 10 years ago.  Patient has stable thyroid readings.  Patient is having no symptoms.  Last TSH was normal.  continue dosage of thyroid medicine.  Recent bronchitis but she is having persistent cough with wheezing.   Current Outpatient Medications on File Prior to Visit  Medication Sig Dispense Refill  . acetaminophen (TYLENOL) 500 MG tablet Take 1,000 mg by mouth every 8 (eight) hours as needed for mild pain.    . cyanocobalamin (,VITAMIN B-12,) 1000 MCG/ML injection INJECT 1 ML MONTHLY 3 mL 3  . diltiazem (CARDIZEM CD) 120 MG 24 hr capsule TAKE ONE CAPSULE BY MOUTH EVERY DAY    . gabapentin (NEURONTIN) 100 MG capsule TAKE 1 CAPSULE (100 MG TOTAL) BY MOUTH AT BEDTIME AS NEEDED. 30 capsule 6  . Multiple Vitamins-Minerals ([employer] SPECTRAVITE ADULT 50+) TABS Take 1 tablet by mouth daily.    . naproxen (NAPROSYN) 500 MG tablet TAKE 1 TABLET BY MOUTH EVERY DAY WITH FOOD 30 tablet 6  . PARoxetine (PAXIL) 10 MG tablet TAKE 1 TABLET BY MOUTH EVERY DAY 90 tablet 2  . potassium chloride SA (K-DUR,KLOR-CON) 20 MEQ tablet Take 1 tablet (20 mEq total) by mouth once. (Patient taking differently: Take 20 mEq by mouth. ) 30 tablet 3  . Probiotic Product (DIGESTIVE ADVANTAGE PO) Take 1 each by mouth every other day.    Marland Kitchen SYNTHROID 125 MCG  tablet TAKE 1 TABLET BY MOUTH EVERY DAY 90 tablet 2  . Syringe/Needle, Disp, (SYRINGE 3CC/22GX1") 22G X 1" 3 ML MISC 1 Syringe by Does not apply route once. Please dispense syringe/needle for b12 injections. Pt will self administer her own injections. 50 each 4  . TUBERCULIN SYR 1CC/25GX5/8" (B-D TB SYRINGE 1CC/25GX5/8") 25G X 5/8" 1 ML MISC USE AS DIRECTED - ONCE A MONTH 4 each 3   No current facility-administered medications on file prior to visit.   Past Medical History:  Diagnosis Date  . Anemia   . Chronic venous insufficiency   . Dysrhythmia    A Fib during hospital stay 04/03/16  . History of rheumatic fever CHILDHOOD  . Hypothyroidism   . Injury of tendon of long head of right biceps 2018  . Peripheral vascular disease (HCC)    legs.   . Pinched nerve in neck   . Short of breath on exertion   . Urge urinary incontinence   . Varicose veins    Left Leg   Past Surgical History:  Procedure Laterality Date  . ABDOMINAL HYSTERECTOMY    . CARDIOVASCULAR STRESS TEST  APRIL 2009   NORMAL LVSF/ EF 61%/ NO ISCHEMIA  . COLONOSCOPY WITH PROPOFOL N/A 04/17/2016   Procedure: COLONOSCOPY WITH PROPOFOL;  Surgeon: Lucilla Lame, MD;  Location: [place];  Service: Endoscopy;  Laterality: N/A;  . DILATION AND CURETTAGE OF UTERUS    . ESOPHAGOGASTRODUODENOSCOPY (EGD) WITH PROPOFOL  N/A 04/04/2016   Procedure: ESOPHAGOGASTRODUODENOSCOPY (EGD) WITH PROPOFOL;  Surgeon: Lucilla Lame, MD;  Location: [facility];  Service: Endoscopy;  Laterality: N/A;  . GANGLION CYST EXCISION  06/09/2012   Procedure: REMOVAL GANGLION OF WRIST;  Surgeon: Magnus Sinning, MD;  Location: [place];  Service: Orthopedics;  Laterality: Left;  EXCISION OF GANGLION CYST LEFT RING FINGER  IV REGIONAL ANESTHESIA  . [place]  . KNEE ARTHROSCOPY WITH MEDIAL MENISECTOMY Left 11/26/2017   Procedure: KNEE ARTHROSCOPY WITH MEDIAL MENISECTOMY;  Surgeon: Corky Mull,  MD;  Location: [facility];  Service: Orthopedics;  Laterality: Left;  . LAPAROSCOPIC CHOLECYSTECTOMY  05-13-2002   Gall Bladder  . LEFT BREAST DUCTAL EXCISION  10-11-2009   BENIGN  . LEFT HAND SURG.  1980'S   EXTENSIVE REPAIR AND REMOVAL OF LIGAMENT/TENDON  (?CANCER)  . LUMBAR LAMINECTOMY  1990  &  1976   RIGHT SIDE L4 - 5  & L5 - S1  . POLYPECTOMY  04/17/2016   Procedure: POLYPECTOMY;  Surgeon: Lucilla Lame, MD;  Location: [facility];  Service: Endoscopy;;  . REMOVAL RIGHT GOIN MASS  1980'S   BENIGN  . REMOVAL TOE NAILBED  AGE 3   ALL 10 TOES-- DEFORMED  . RIGHT BREAST LUMPECTOMY     BENIGN  . RIGHT FOOT SURG  X3   REPAIR OF 2 TOES  . ROTATOR CUFF REPAIR  04-11-2008   LEFT SHOULDER  . SPINE SURGERY    . THYROID GOITER REMOVED  1970'S  . TONSILLECTOMY  CHILD  . VAGINAL HYSTERECTOMY/ ANTERIOR & POSTERIOR REPAIR/ TRANSOBTURATOR SLING  04-25-2005   CYSTOCELE/ RECTOCELE/ UTERINE PROLAPSE/ SUI    Family History  Problem Relation Age of Onset  . Deep vein thrombosis Mother        Right Leg  . Heart disease Mother        Before age 19-  CHF  . Varicose Veins Mother   . Heart attack Mother   . Heart disease Father        Before age 16-  PVD  . Heart attack Father   . Arthritis Father        Gout  . Hypertension Father   . Cancer Sister        Thyroid-Back  . Cancer Sister        Breast and Lung  . Heart disease Sister   . Heart disease Sister   . Other Sister        platelet disorder  . Colon cancer Maternal Grandmother   . Breast cancer Maternal Grandmother   . Heart attack Other        father's side multiple family members  . Stroke Other        father's side multiple family members  . Colon cancer Maternal Uncle   . Diabetes Maternal Uncle   . Diabetes Maternal Aunt    Social History   Socioeconomic History  . Marital status: Divorced    Spouse name: Not on file  . Number of children: 3  . Years of education: Not on file  . Highest education level:  8th grade  Occupational History  . Not on file  Tobacco Use  . Smoking status: Former Smoker    Years: 40.00    Types: Cigarettes    Quit date: 1993    Years since quitting: 28.5  . Smokeless tobacco: Never Used  Vaping Use  . Vaping Use: Never used  Substance and Sexual Activity  . Alcohol use: Never  . Drug use: Never  . Sexual activity: Not on file  Other Topics Concern  . Not on file  Social History Narrative   Lives alone   Retired   Caffeine: coffee 3 cups daily   Social Determinants of Health   Financial Resource Strain:   . Difficulty of Paying Living Expenses:   Food Insecurity:   . Worried About Charity fundraiser in the Last Year:   . Arboriculturist in the Last Year:   Transportation Needs:   . Film/video editor (Medical):   Marland Kitchen Lack of Transportation (Non-Medical):   Physical Activity:   . Days of Exercise per Week:   . Minutes of Exercise per Session:   Stress:   . Feeling of Stress :   Social Connections:   . Frequency of Communication with Friends and Family:   . Frequency of Social Gatherings with Friends and Family:   . Attends Religious Services:   . Active Member of Clubs or Organizations:   . Attends Archivist Meetings:   Marland Kitchen Marital Status:     Review of Systems  Constitutional: Negative.   HENT: Negative.   Eyes: Negative.   Respiratory: Positive for cough.   Cardiovascular: Negative.   Gastrointestinal: Negative.   Genitourinary: Negative.   Musculoskeletal: Negative.   Skin: Negative.   Neurological: Negative.   Psychiatric/Behavioral: Negative.      Objective:  BP 140/84   Pulse 72   Temp (!) 97.2 F (36.2 C)   Resp 18   Ht 5\' 6"  (1.676 m)   Wt 160 lb 3.2 oz (72.7 kg)   SpO2 93%   BMI 25.86 kg/m   BP/Weight 07/03/2020 03/01/2020 0/81/4481  Systolic BP 856 314 970  Diastolic BP 84 85 263  Wt. (Lbs) 160.2 169 -  BMI 25.86 27.28 -    Physical Exam Vitals reviewed.  Constitutional:      Appearance:  Normal appearance.  HENT:     Head: Normocephalic and atraumatic.     Right Ear: Tympanic membrane normal.     Left Ear: Tympanic membrane normal.     Nose: Nose normal.     Mouth/Throat:     Mouth: Mucous membranes are moist.     Pharynx: Oropharynx is clear.  Eyes:     Extraocular Movements: Extraocular movements intact.     Conjunctiva/sclera: Conjunctivae normal.     Pupils: Pupils are equal, round, and reactive to light.  Cardiovascular:     Rate and Rhythm: Regular rhythm.     Heart sounds: Normal heart sounds.  Pulmonary:     Breath sounds: Wheezing present.  Abdominal:     General: Abdomen is flat.     Palpations: Abdomen is soft.  Musculoskeletal:     Cervical back: Normal range of motion.  Skin:    General: Skin is dry.     Capillary Refill: Capillary refill takes less than 2 seconds.  Neurological:     General: No focal deficit present.     Mental Status: She is alert and oriented to person, place, and time. Mental status is at baseline.  Psychiatric:        Mood and Affect: Mood normal.        Thought Content: Thought content normal.     Lab Results  Component Value Date   WBC 6.3 03/01/2020   HGB 14.8 03/01/2020   HCT 43.0 03/01/2020  PLT 228 03/01/2020   GLUCOSE 87 03/01/2020   CHOL 184 03/01/2020   TRIG 85 03/01/2020   HDL 64 03/01/2020   LDLCALC 104 (H) 03/01/2020   ALT 16 03/01/2020   AST 24 03/01/2020   NA 142 03/01/2020   K 4.9 03/01/2020   CL 103 03/01/2020   CREATININE 0.87 03/01/2020   BUN 25 03/01/2020   CO2 25 03/01/2020   TSH 2.210 03/01/2020   INR 1.16 04/03/2016      Assessment & Plan:   1. Vitamin B12 deficiency due to intestinal malabsorption Patient is on supplements  2. Idiopathic peripheral neuropathy AN INDIVIDUAL CARE PLAN for neuropathy was established and reinforced today.  The patient's status was assessed using clinical findings on exam, labs, and other diagnostic testing. Patient's success at meeting treatment  goals based on disease specific evidence-bassed guidelines and found to be in good control. RECOMMENDATIONS include maintaiing present medicines and treatment.  3. Other specified hypothyroidism - TSH Patient is known to have hypothryoidsim and is n treatment with levothyroxine 173mcg.  Patient was diagnosed 10 years ago.  Other treatment includes none.  Patient is compliant with medicines and last TSH 6 months ago.  Last TSH was normal.  4. Paroxysmal atrial fibrillation with RVR ([place]) - CBC with Differential/Platelet - Comprehensive metabolic panel AN INDIVIDUAL CARE PLAN for atrial fibrillation was established and reinforced today.  The patient's status was assessed using clinical findings on exam, labs, and other diagnostic testing. Patient's success at meeting treatment goals based on disease specific evidence-bassed guidelines and found to be in good control. RECOMMENDATIONS include maintaining present medicines and treatment.  5. Bronchospasm with bronchitis, acute Patient has acute bronchospasm for her bronchitis, treat as asthma    Meds ordered this encounter  Medications  . albuterol (VENTOLIN HFA) 108 (90 Base) MCG/ACT inhaler    Sig: Inhale 2 puffs into the lungs every 6 (six) hours as needed for wheezing or shortness of breath.    Dispense:  8 g    Refill:  3  . predniSONE (STERAPRED UNI-PAK 21 TAB) 10 MG (21) TBPK tablet    Sig: Take 6ills first day , then 5 pills day 2 and then cut down one pill day until gone    Dispense:  21 tablet    Refill:  0    Orders Placed This Encounter  Procedures  . CBC with Differential/Platelet  . Comprehensive metabolic panel  . TSH     Follow-up: Return in about 4 months (around 11/03/2020) for fasting.  An After Visit Summary was printed and given to the patient.  [place] [phone]

## 2020-07-04 LAB — COMPREHENSIVE METABOLIC PANEL
ALT: 23 IU/L (ref 0–32)
AST: 22 IU/L (ref 0–40)
Albumin/Globulin Ratio: 1.9 (ref 1.2–2.2)
Albumin: 4.7 g/dL (ref 3.7–4.7)
Alkaline Phosphatase: 105 IU/L (ref 48–121)
BUN/Creatinine Ratio: 22 (ref 12–28)
BUN: 20 mg/dL (ref 8–27)
Bilirubin Total: 0.5 mg/dL (ref 0.0–1.2)
CO2: 24 mmol/L (ref 20–29)
Calcium: 9.9 mg/dL (ref 8.7–10.3)
Chloride: 101 mmol/L (ref 96–106)
Creatinine, Ser: 0.93 mg/dL (ref 0.57–1.00)
GFR calc Af Amer: 71 mL/min/{1.73_m2} (ref >59)
GFR calc non Af Amer: 62 mL/min/{1.73_m2} (ref >59)
Globulin, Total: 2.5 g/dL (ref 1.5–4.5)
Glucose: 83 mg/dL (ref 65–99)
Potassium: 4.6 mmol/L (ref 3.5–5.2)
Sodium: 141 mmol/L (ref 134–144)
Total Protein: 7.2 g/dL (ref 6.0–8.5)

## 2020-07-04 LAB — CBC WITH DIFFERENTIAL/PLATELET
Basophils Absolute: 0 10*3/uL (ref 0.0–0.2)
Basos: 1 %
EOS (ABSOLUTE): 0.2 10*3/uL (ref 0.0–0.4)
Eos: 2 %
Hematocrit: 45.3 % (ref 34.0–46.6)
Hemoglobin: 15 g/dL (ref 11.1–15.9)
Immature Grans (Abs): 0.1 10*3/uL (ref 0.0–0.1)
Immature Granulocytes: 1 %
Lymphocytes Absolute: 3.1 10*3/uL (ref 0.7–3.1)
Lymphs: 40 %
MCH: 30.2 pg (ref 26.6–33.0)
MCHC: 33.1 g/dL (ref 31.5–35.7)
MCV: 91 fL (ref 79–97)
Monocytes Absolute: 0.6 10*3/uL (ref 0.1–0.9)
Monocytes: 8 %
Neutrophils Absolute: 3.8 10*3/uL (ref 1.4–7.0)
Neutrophils: 48 %
Platelets: 301 10*3/uL (ref 150–450)
RBC: 4.97 x10E6/uL (ref 3.77–5.28)
RDW: 12.8 % (ref 11.7–15.4)
WBC: 7.8 10*3/uL (ref 3.4–10.8)

## 2020-07-04 LAB — TSH: TSH: 3.48 u[IU]/mL (ref 0.450–4.500)

## 2020-07-04 NOTE — Progress Notes (Signed)
CBC normal, kidney and liver tests normal, TSH 3.48 normal,  lp

## 2020-07-09 ENCOUNTER — Other Ambulatory Visit: Payer: Self-pay | Admitting: Cardiology

## 2020-07-09 DIAGNOSIS — R600 Localized edema: Secondary | ICD-10-CM

## 2020-07-09 DIAGNOSIS — I480 Paroxysmal atrial fibrillation: Secondary | ICD-10-CM

## 2020-07-11 ENCOUNTER — Other Ambulatory Visit: Payer: Self-pay | Admitting: Cardiology

## 2020-07-11 ENCOUNTER — Other Ambulatory Visit
Admission: RE | Admit: 2020-07-11 | Discharge: 2020-07-11 | Disposition: A | Discharge status: Home / Self Care | Payer: Medicare Other | Source: Ambulatory Visit | Attending: Cardiology | Admitting: Cardiology

## 2020-07-11 ENCOUNTER — Ambulatory Visit
Admission: RE | Admit: 2020-07-11 | Discharge: 2020-07-11 | Disposition: A | Discharge status: Home / Self Care | Payer: Medicare Other | Source: Ambulatory Visit | Attending: Cardiology | Admitting: Cardiology

## 2020-07-11 DIAGNOSIS — M4312 Spondylolisthesis, cervical region: Secondary | ICD-10-CM | POA: Diagnosis not present

## 2020-07-11 DIAGNOSIS — R52 Pain, unspecified: Secondary | ICD-10-CM | POA: Diagnosis not present

## 2020-07-11 DIAGNOSIS — M47812 Spondylosis without myelopathy or radiculopathy, cervical region: Secondary | ICD-10-CM | POA: Diagnosis not present

## 2020-07-11 DIAGNOSIS — I700 Atherosclerosis of aorta: Secondary | ICD-10-CM | POA: Diagnosis not present

## 2020-07-11 DIAGNOSIS — R200 Anesthesia of skin: Secondary | ICD-10-CM | POA: Diagnosis not present

## 2020-07-12 ENCOUNTER — Other Ambulatory Visit: Payer: Self-pay

## 2020-07-12 ENCOUNTER — Ambulatory Visit
Admission: RE | Admit: 2020-07-12 | Discharge: 2020-07-12 | Disposition: A | Discharge status: Home / Self Care | Payer: Medicare Other | Source: Ambulatory Visit | Attending: Cardiology | Admitting: Cardiology

## 2020-07-12 DIAGNOSIS — I480 Paroxysmal atrial fibrillation: Secondary | ICD-10-CM | POA: Diagnosis not present

## 2020-07-12 DIAGNOSIS — M79605 Pain in left leg: Secondary | ICD-10-CM | POA: Diagnosis not present

## 2020-07-12 DIAGNOSIS — R600 Localized edema: Secondary | ICD-10-CM | POA: Diagnosis not present

## 2020-07-16 DIAGNOSIS — I340 Nonrheumatic mitral (valve) insufficiency: Secondary | ICD-10-CM | POA: Diagnosis not present

## 2020-07-23 DIAGNOSIS — M25552 Pain in left hip: Secondary | ICD-10-CM | POA: Diagnosis not present

## 2020-07-23 DIAGNOSIS — I480 Paroxysmal atrial fibrillation: Secondary | ICD-10-CM | POA: Diagnosis not present

## 2020-07-23 DIAGNOSIS — I340 Nonrheumatic mitral (valve) insufficiency: Secondary | ICD-10-CM | POA: Diagnosis not present

## 2020-07-23 DIAGNOSIS — I10 Essential (primary) hypertension: Secondary | ICD-10-CM | POA: Diagnosis not present

## 2020-08-14 ENCOUNTER — Encounter: Payer: Self-pay | Admitting: Legal Medicine

## 2020-08-14 ENCOUNTER — Telehealth (INDEPENDENT_AMBULATORY_CARE_PROVIDER_SITE_OTHER): Payer: Medicare Other | Admitting: Legal Medicine

## 2020-08-14 DIAGNOSIS — Z20822 Contact with and (suspected) exposure to covid-19: Secondary | ICD-10-CM | POA: Diagnosis not present

## 2020-08-14 NOTE — Progress Notes (Signed)
Virtual Visit via Telephone Note   This visit type was conducted due to national recommendations for restrictions regarding the COVID-19 Pandemic (e.g. social distancing) in an effort to limit this patient's exposure and mitigate transmission in our community.  Due to her co-morbid illnesses, this patient is at least at moderate risk for complications without adequate follow up.  This format is felt to be most appropriate for this patient at this time.  The patient did not have access to video technology/had technical difficulties with video requiring transitioning to audio format only (telephone).  All issues noted in this document were discussed and addressed.  No physical exam could be performed with this format.  Patient verbally consented to a telehealth visit.   Date:  08/14/2020   ID:  Sydney Ayala, DOB Mar 08, 1946, MRN [id number]  Patient Location: Home Provider Location: Office/Clinic  PCP:  Lillard Anes, MD   Evaluation Performed:  New Patient Evaluation  Chief Complaint:  Cough, raspy breath, headache , fever  History of Present Illness:    Sydney Ayala is a 74 y.o. female with exposed to Covid, , family has covid, symptoms Saturday.headache, headache  The patient does have symptoms concerning for COVID-19 infection (fever, chills, cough, or new shortness of breath).    Past Medical History:  Diagnosis Date  . Anemia   . Chronic venous insufficiency   . Dysrhythmia    A Fib during hospital stay 04/03/16  . History of rheumatic fever CHILDHOOD  . Hypothyroidism   . Injury of tendon of long head of right biceps 2018  . Peripheral vascular disease (HCC)    legs.   . Pinched nerve in neck   . Short of breath on exertion   . Urge urinary incontinence   . Varicose veins    Left Leg    Past Surgical History:  Procedure Laterality Date  . ABDOMINAL HYSTERECTOMY    . CARDIOVASCULAR STRESS TEST  APRIL 2009   NORMAL LVSF/ EF 61%/ NO ISCHEMIA  . COLONOSCOPY  WITH PROPOFOL N/A 04/17/2016   Procedure: COLONOSCOPY WITH PROPOFOL;  Surgeon: Lucilla Lame, MD;  Location: [place];  Service: Endoscopy;  Laterality: N/A;  . DILATION AND CURETTAGE OF UTERUS    . ESOPHAGOGASTRODUODENOSCOPY (EGD) WITH PROPOFOL N/A 04/04/2016   Procedure: ESOPHAGOGASTRODUODENOSCOPY (EGD) WITH PROPOFOL;  Surgeon: Lucilla Lame, MD;  Location: [facility];  Service: Endoscopy;  Laterality: N/A;  . GANGLION CYST EXCISION  06/09/2012   Procedure: REMOVAL GANGLION OF WRIST;  Surgeon: Magnus Sinning, MD;  Location: [place];  Service: Orthopedics;  Laterality: Left;  EXCISION OF GANGLION CYST LEFT RING FINGER  IV REGIONAL ANESTHESIA  . [place]  . KNEE ARTHROSCOPY WITH MEDIAL MENISECTOMY Left 11/26/2017   Procedure: KNEE ARTHROSCOPY WITH MEDIAL MENISECTOMY;  Surgeon: Corky Mull, MD;  Location: [facility];  Service: Orthopedics;  Laterality: Left;  . LAPAROSCOPIC CHOLECYSTECTOMY  05-13-2002   Gall Bladder  . LEFT BREAST DUCTAL EXCISION  10-11-2009   BENIGN  . LEFT HAND SURG.  1980'S   EXTENSIVE REPAIR AND REMOVAL OF LIGAMENT/TENDON  (?CANCER)  . LUMBAR LAMINECTOMY  1990  &  1976   RIGHT SIDE L4 - 5  & L5 - S1  . POLYPECTOMY  04/17/2016   Procedure: POLYPECTOMY;  Surgeon: Lucilla Lame, MD;  Location: [facility];  Service: Endoscopy;;  . REMOVAL RIGHT GOIN MASS  1980'S   BENIGN  . REMOVAL TOE NAILBED  AGE  18   ALL 10 TOES-- DEFORMED  . RIGHT BREAST LUMPECTOMY     BENIGN  . RIGHT FOOT SURG  X3   REPAIR OF 2 TOES  . ROTATOR CUFF REPAIR  04-11-2008   LEFT SHOULDER  . SPINE SURGERY    . THYROID GOITER REMOVED  1970'S  . TONSILLECTOMY  CHILD  . VAGINAL HYSTERECTOMY/ ANTERIOR & POSTERIOR REPAIR/ TRANSOBTURATOR SLING  04-25-2005   CYSTOCELE/ RECTOCELE/ UTERINE PROLAPSE/ SUI    Family History  Problem Relation Age of Onset  . Deep vein thrombosis Mother        Right Leg  . Heart disease Mother         Before age 46-  CHF  . Varicose Veins Mother   . Heart attack Mother   . Heart disease Father        Before age 75-  PVD  . Heart attack Father   . Arthritis Father        Gout  . Hypertension Father   . Cancer Sister        Thyroid-Back  . Cancer Sister        Breast and Lung  . Heart disease Sister   . Heart disease Sister   . Other Sister        platelet disorder  . Colon cancer Maternal Grandmother   . Breast cancer Maternal Grandmother   . Heart attack Other        father's side multiple family members  . Stroke Other        father's side multiple family members  . Colon cancer Maternal Uncle   . Diabetes Maternal Uncle   . Diabetes Maternal Aunt     Social History   Socioeconomic History  . Marital status: Divorced    Spouse name: Not on file  . Number of children: 3  . Years of education: Not on file  . Highest education level: 8th grade  Occupational History  . Not on file  Tobacco Use  . Smoking status: Former Smoker    Years: 40.00    Types: Cigarettes    Quit date: 1993    Years since quitting: 28.6  . Smokeless tobacco: Never Used  Vaping Use  . Vaping Use: Never used  Substance and Sexual Activity  . Alcohol use: Never  . Drug use: Never  . Sexual activity: Not on file  Other Topics Concern  . Not on file  Social History Narrative   Lives alone   Retired   Caffeine: coffee 3 cups daily   Social Determinants of Health   Financial Resource Strain:   . Difficulty of Paying Living Expenses: Not on file  Food Insecurity:   . Worried About Charity fundraiser in the Last Year: Not on file  . Ran Out of Food in the Last Year: Not on file  Transportation Needs:   . Lack of Transportation (Medical): Not on file  . Lack of Transportation (Non-Medical): Not on file  Physical Activity:   . Days of Exercise per Week: Not on file  . Minutes of Exercise per Session: Not on file  Stress:   . Feeling of Stress : Not on file  Social Connections:     . Frequency of Communication with Friends and Family: Not on file  . Frequency of Social Gatherings with Friends and Family: Not on file  . Attends Religious Services: Not on file  . Active Member of Clubs or Organizations: Not on file  .  Attends Archivist Meetings: Not on file  . Marital Status: Not on file  Intimate Partner Violence:   . Fear of Current or Ex-Partner: Not on file  . Emotionally Abused: Not on file  . Physically Abused: Not on file  . Sexually Abused: Not on file    Outpatient Medications Prior to Visit  Medication Sig Dispense Refill  . acetaminophen (TYLENOL) 500 MG tablet Take 1,000 mg by mouth every 8 (eight) hours as needed for mild pain.    Marland Kitchen albuterol (VENTOLIN HFA) 108 (90 Base) MCG/ACT inhaler Inhale 2 puffs into the lungs every 6 (six) hours as needed for wheezing or shortness of breath. 8 g 3  . cyanocobalamin (,VITAMIN B-12,) 1000 MCG/ML injection INJECT 1 ML MONTHLY 3 mL 3  . diltiazem (CARDIZEM CD) 120 MG 24 hr capsule TAKE ONE CAPSULE BY MOUTH EVERY DAY    . gabapentin (NEURONTIN) 100 MG capsule TAKE 1 CAPSULE (100 MG TOTAL) BY MOUTH AT BEDTIME AS NEEDED. 30 capsule 6  . Multiple Vitamins-Minerals ([employer] SPECTRAVITE ADULT 50+) TABS Take 1 tablet by mouth daily.    . naproxen (NAPROSYN) 500 MG tablet TAKE 1 TABLET BY MOUTH EVERY DAY WITH FOOD 30 tablet 6  . PARoxetine (PAXIL) 10 MG tablet TAKE 1 TABLET BY MOUTH EVERY DAY 90 tablet 2  . potassium chloride SA (K-DUR,KLOR-CON) 20 MEQ tablet Take 1 tablet (20 mEq total) by mouth once. (Patient taking differently: Take 20 mEq by mouth. ) 30 tablet 3  . Probiotic Product (DIGESTIVE ADVANTAGE PO) Take 1 each by mouth every other day.    Marland Kitchen SYNTHROID 125 MCG tablet TAKE 1 TABLET BY MOUTH EVERY DAY 90 tablet 2  . Syringe/Needle, Disp, (SYRINGE 3CC/22GX1") 22G X 1" 3 ML MISC 1 Syringe by Does not apply route once. Please dispense syringe/needle for b12 injections. Pt will self administer her own injections.  50 each 4  . TUBERCULIN SYR 1CC/25GX5/8" (B-D TB SYRINGE 1CC/25GX5/8") 25G X 5/8" 1 ML MISC USE AS DIRECTED - ONCE A MONTH 4 each 3  . predniSONE (STERAPRED UNI-PAK 21 TAB) 10 MG (21) TBPK tablet Take 6ills first day , then 5 pills day 2 and then cut down one pill day until gone 21 tablet 0   No facility-administered medications prior to visit.   .med Allergies:   Latex, Codeine, Contrast media [iodinated diagnostic agents], Indomethacin, and Adhesive [tape]   Social History   Tobacco Use  . Smoking status: Former Smoker    Years: 40.00    Types: Cigarettes    Quit date: 1993    Years since quitting: 28.6  . Smokeless tobacco: Never Used  Vaping Use  . Vaping Use: Never used  Substance Use Topics  . Alcohol use: Never  . Drug use: Never     Review of Systems  Constitutional: Positive for chills and fever.  HENT: Positive for congestion and sinus pain.   Eyes: Negative.   Respiratory: Positive for cough and shortness of breath.   Cardiovascular: Negative.   Gastrointestinal: Negative.   Genitourinary: Negative.   Musculoskeletal: Negative.   Neurological: Positive for headaches.  Psychiatric/Behavioral: Negative.      Labs/Other Tests and Data Reviewed:    Recent Labs: 07/03/2020: ALT 23; BUN 20; Creatinine, Ser 0.93; Hemoglobin 15.0; Platelets 301; Potassium 4.6; Sodium 141; TSH 3.480   Recent Lipid Panel Lab Results  Component Value Date/Time   CHOL 184 03/01/2020 11:29 AM   TRIG 85 03/01/2020 11:29 AM   HDL 64  03/01/2020 11:29 AM   CHOLHDL 2.9 03/01/2020 11:29 AM   LDLCALC 104 (H) 03/01/2020 11:29 AM    Wt Readings from Last 3 Encounters:  08/14/20 160 lb (72.6 kg)  07/03/20 160 lb 3.2 oz (72.7 kg)  03/01/20 169 lb (76.7 kg)     Objective:    Vital Signs:  Temp 99 F (37.2 C)   Ht 5\' 5"  (1.651 m)   Wt 160 lb (72.6 kg)   BMI 26.63 kg/m    Physical Exam VS reviewed  ASSESSMENT & PLAN:   Diagnoses and all orders for this visit: Close exposure to  COVID-19 virus -     Novel Coronavirus, NAA ([facility]) Check covid test and remain in quarentine for 7 days.       COVID-19 Education: The signs and symptoms of COVID-19 were discussed with the patient and how to seek care for testing (follow up with PCP or arrange E-visit). The importance of social distancing was discussed today.  Time:   Today, I have spent 20 minutes with the patient with telehealth technology discussing the above problems.    Follow Up:  In Person prn  Signed, Reinaldo Meeker, MD  08/14/2020 3:32 PM    [place]

## 2020-08-16 ENCOUNTER — Other Ambulatory Visit: Payer: Self-pay | Admitting: Adult Health

## 2020-08-16 DIAGNOSIS — U071 COVID-19: Secondary | ICD-10-CM

## 2020-08-16 LAB — NOVEL CORONAVIRUS, NAA: SARS-CoV-2, NAA: DETECTED — AB

## 2020-08-16 NOTE — Progress Notes (Signed)
Sydney Ayala positive covid, need to get antibodies lp

## 2020-08-16 NOTE — Progress Notes (Signed)
I connected by phone with Sydney Ayala on 08/16/2020 at 3:24 PM to discuss the potential use of a new treatment for mild to moderate COVID-19 viral infection in non-hospitalized patients.  This patient is a 74 y.o. female that meets the FDA criteria for Emergency Use Authorization of COVID monoclonal antibody casirivimab/imdevimab.  Has a (+) direct SARS-CoV-2 viral test result  Has mild or moderate COVID-19   Is NOT hospitalized due to COVID-19  Is within 10 days of symptom onset  Has at least one of the high risk factor(s) for progression to severe COVID-19 and/or hospitalization as defined in EUA.  Specific high risk criteria : Older age (>/= 72 yo)   I have spoken and communicated the following to the patient or parent/caregiver regarding COVID monoclonal antibody treatment:  1. FDA has authorized the emergency use for the treatment of mild to moderate COVID-19 in adults and pediatric patients with positive results of direct SARS-CoV-2 viral testing who are 26 years of age and older weighing at least 40 kg, and who are at high risk for progressing to severe COVID-19 and/or hospitalization.  2. The significant known and potential risks and benefits of COVID monoclonal antibody, and the extent to which such potential risks and benefits are unknown.  3. Information on available alternative treatments and the risks and benefits of those alternatives, including clinical trials.  4. Patients treated with COVID monoclonal antibody should continue to self-isolate and use infection control measures (e.g., wear mask, isolate, social distance, avoid sharing personal items, clean and disinfect "high touch" surfaces, and frequent handwashing) according to CDC guidelines.   5. The patient or parent/caregiver has the option to accept or refuse COVID monoclonal antibody treatment.  After reviewing this information with the patient, The patient agreed to proceed with receiving casirivimab\imdevimab  infusion and will be provided a copy of the Fact sheet prior to receiving the infusion. [place] Dock 08/16/2020 3:24 PM

## 2020-08-17 ENCOUNTER — Ambulatory Visit (HOSPITAL_COMMUNITY)
Admission: RE | Admit: 2020-08-17 | Discharge: 2020-08-17 | Disposition: A | Discharge status: Home / Self Care | Payer: Medicare Other | Source: Ambulatory Visit | Attending: Pulmonary Disease | Admitting: Pulmonary Disease

## 2020-08-17 DIAGNOSIS — Z23 Encounter for immunization: Secondary | ICD-10-CM | POA: Diagnosis not present

## 2020-08-17 DIAGNOSIS — U071 COVID-19: Secondary | ICD-10-CM | POA: Insufficient documentation

## 2020-08-17 MED ORDER — METHYLPREDNISOLONE SODIUM SUCC 125 MG IJ SOLR: 125.0000 mg | Freq: Once | INTRAMUSCULAR | Status: DC | PRN

## 2020-08-17 MED ORDER — DIPHENHYDRAMINE HCL 50 MG/ML IJ SOLN: 50.0000 mg | Freq: Once | INTRAMUSCULAR | Status: DC | PRN

## 2020-08-17 MED ORDER — FAMOTIDINE IN NACL 20-0.9 MG/50ML-% IV SOLN: 20.0000 mg | Freq: Once | INTRAVENOUS | Status: DC | PRN

## 2020-08-17 MED ORDER — SODIUM CHLORIDE 0.9 % IV SOLN: INTRAVENOUS | Status: DC | PRN

## 2020-08-17 MED ORDER — SODIUM CHLORIDE 0.9 % IV SOLN
1200.0000 mg | Freq: Once | INTRAVENOUS | Status: AC
  Administered 2020-08-17: 1200 mg via INTRAVENOUS
  Filled 2020-08-17: qty 10

## 2020-08-17 MED ORDER — EPINEPHRINE 0.3 MG/0.3ML IJ SOAJ: 0.3000 mg | Freq: Once | INTRAMUSCULAR | Status: DC | PRN

## 2020-08-17 MED ORDER — ALBUTEROL SULFATE HFA 108 (90 BASE) MCG/ACT IN AERS: 2.0000 | INHALATION_SPRAY | Freq: Once | RESPIRATORY_TRACT | Status: DC | PRN

## 2020-08-17 NOTE — Discharge Instructions (Signed)

## 2020-08-17 NOTE — Progress Notes (Signed)
   Diagnosis: COVID-19  Physician: Dr. Asencion Noble  Procedure: [facility] Med: casirivimab\imdevimab infusion - Provided patient with casirivimab\imdevimab fact sheet for patients, parents and caregivers prior to infusion.  Complications: No immediate complications noted.  Discharge: Discharged home   Sydney Ayala 08/17/2020

## 2020-08-30 ENCOUNTER — Other Ambulatory Visit: Payer: Self-pay | Admitting: Legal Medicine

## 2020-08-30 ENCOUNTER — Other Ambulatory Visit: Payer: Self-pay | Admitting: Neurology

## 2020-08-30 DIAGNOSIS — H2513 Age-related nuclear cataract, bilateral: Secondary | ICD-10-CM | POA: Diagnosis not present

## 2020-08-30 DIAGNOSIS — H353221 Exudative age-related macular degeneration, left eye, with active choroidal neovascularization: Secondary | ICD-10-CM | POA: Diagnosis not present

## 2020-08-30 DIAGNOSIS — H353112 Nonexudative age-related macular degeneration, right eye, intermediate dry stage: Secondary | ICD-10-CM | POA: Diagnosis not present

## 2020-11-01 DIAGNOSIS — H353221 Exudative age-related macular degeneration, left eye, with active choroidal neovascularization: Secondary | ICD-10-CM | POA: Diagnosis not present

## 2020-11-05 ENCOUNTER — Ambulatory Visit (INDEPENDENT_AMBULATORY_CARE_PROVIDER_SITE_OTHER): Payer: Medicare Other | Admitting: Legal Medicine

## 2020-11-05 ENCOUNTER — Other Ambulatory Visit: Payer: Self-pay

## 2020-11-05 ENCOUNTER — Encounter: Payer: Self-pay | Admitting: Legal Medicine

## 2020-11-05 VITALS — BP 140/80 | HR 85 | Temp 97.2°F | Resp 16 | Ht 65.0 in | Wt 158.8 lb

## 2020-11-05 DIAGNOSIS — I739 Peripheral vascular disease, unspecified: Secondary | ICD-10-CM | POA: Diagnosis not present

## 2020-11-05 DIAGNOSIS — K5909 Other constipation: Secondary | ICD-10-CM

## 2020-11-05 DIAGNOSIS — E038 Other specified hypothyroidism: Secondary | ICD-10-CM

## 2020-11-05 DIAGNOSIS — K219 Gastro-esophageal reflux disease without esophagitis: Secondary | ICD-10-CM | POA: Diagnosis not present

## 2020-11-05 DIAGNOSIS — E538 Deficiency of other specified B group vitamins: Secondary | ICD-10-CM

## 2020-11-05 DIAGNOSIS — L081 Erythrasma: Secondary | ICD-10-CM | POA: Diagnosis not present

## 2020-11-05 DIAGNOSIS — K909 Intestinal malabsorption, unspecified: Secondary | ICD-10-CM | POA: Diagnosis not present

## 2020-11-05 DIAGNOSIS — H44812 Hemophthalmos, left eye: Secondary | ICD-10-CM | POA: Diagnosis not present

## 2020-11-05 MED ORDER — CLOTRIMAZOLE-BETAMETHASONE 1-0.05 % EX CREA: 1.0000 "application " | TOPICAL_CREAM | Freq: Two times a day (BID) | CUTANEOUS | 2 refills | Status: DC

## 2020-11-05 MED ORDER — CLARITHROMYCIN 500 MG PO TABS: 500.0000 mg | ORAL_TABLET | Freq: Two times a day (BID) | ORAL | 0 refills | Status: DC

## 2020-11-05 NOTE — Progress Notes (Signed)
Subjective:  Patient ID: Sydney Ayala, female    DOB: 1946/03/18  Age: 74 y.o. MRN: [id number]  Chief Complaint  Patient presents with  . Hypothyroidism    HPI : Chronic visit  Retro bulbar bleed in left teye she is seeing [place] for injections.. need to get information.  She is having red confluent rash on both feet, denies new shoes.  Patient has HYPOTHYROIDISM.  Diagnosed 10 years ago.  Patient has stable thyroid readings.  Patient is having no changes.  Last TSH was noemal.  continue dosage of thyroid medicine.  She has chronic constipation, we discussed fluids, may try linzess   Current Outpatient Medications on File Prior to Visit  Medication Sig Dispense Refill  . acetaminophen (TYLENOL) 500 MG tablet Take 1,000 mg by mouth every 8 (eight) hours as needed for mild pain.    Marland Kitchen albuterol (VENTOLIN HFA) 108 (90 Base) MCG/ACT inhaler Inhale 2 puffs into the lungs every 6 (six) hours as needed for wheezing or shortness of breath. 8 g 3  . cyanocobalamin (,VITAMIN B-12,) 1000 MCG/ML injection INJECT 1 ML MONTHLY 3 mL 3  . diltiazem (CARDIZEM CD) 120 MG 24 hr capsule TAKE ONE CAPSULE BY MOUTH EVERY DAY    . gabapentin (NEURONTIN) 100 MG capsule TAKE 1 CAPSULE (100 MG TOTAL) BY MOUTH AT BEDTIME AS NEEDED. 90 capsule 2  . Multiple Vitamins-Minerals ([employer] SPECTRAVITE ADULT 50+) TABS Take 1 tablet by mouth daily.    . Multiple Vitamins-Minerals (ICAPS AREDS FORMULA PO) Take 1 tablet by mouth daily.    . naproxen (NAPROSYN) 500 MG tablet TAKE 1 TABLET BY MOUTH EVERY DAY WITH FOOD 30 tablet 6  . PARoxetine (PAXIL) 10 MG tablet TAKE 1 TABLET BY MOUTH EVERY DAY 90 tablet 2  . potassium chloride SA (K-DUR,KLOR-CON) 20 MEQ tablet Take 1 tablet (20 mEq total) by mouth once. (Patient taking differently: Take 20 mEq by mouth. ) 30 tablet 3  . Probiotic Product (DIGESTIVE ADVANTAGE PO) Take 1 each by mouth every other day.    Marland Kitchen SYNTHROID 125 MCG tablet TAKE 1 TABLET BY MOUTH EVERY DAY 90  tablet 2  . Syringe/Needle, Disp, (SYRINGE 3CC/22GX1") 22G X 1" 3 ML MISC 1 Syringe by Does not apply route once. Please dispense syringe/needle for b12 injections. Pt will self administer her own injections. 50 each 4  . TUBERCULIN SYR 1CC/25GX5/8" (B-D TB SYRINGE 1CC/25GX5/8") 25G X 5/8" 1 ML MISC USE AS DIRECTED - ONCE A MONTH 4 each 3   No current facility-administered medications on file prior to visit.   Past Medical History:  Diagnosis Date  . Chronic venous insufficiency   . Dysrhythmia    A Fib during hospital stay 04/03/16  . History of rheumatic fever CHILDHOOD  . Hypothyroidism   . Injury of tendon of long head of right biceps 2018  . Pinched nerve in neck    Past Surgical History:  Procedure Laterality Date  . ABDOMINAL HYSTERECTOMY    . CARDIOVASCULAR STRESS TEST  APRIL 2009   NORMAL LVSF/ EF 61%/ NO ISCHEMIA  . COLONOSCOPY WITH PROPOFOL N/A 04/17/2016   Procedure: COLONOSCOPY WITH PROPOFOL;  Surgeon: Lucilla Lame, MD;  Location: [place];  Service: Endoscopy;  Laterality: N/A;  . DILATION AND CURETTAGE OF UTERUS    . ESOPHAGOGASTRODUODENOSCOPY (EGD) WITH PROPOFOL N/A 04/04/2016   Procedure: ESOPHAGOGASTRODUODENOSCOPY (EGD) WITH PROPOFOL;  Surgeon: Lucilla Lame, MD;  Location: [facility];  Service: Endoscopy;  Laterality: N/A;  . GANGLION CYST  EXCISION  06/09/2012   Procedure: REMOVAL GANGLION OF WRIST;  Surgeon: Magnus Sinning, MD;  Location: [place];  Service: Orthopedics;  Laterality: Left;  EXCISION OF GANGLION CYST LEFT RING FINGER  IV REGIONAL ANESTHESIA  . [place]  . KNEE ARTHROSCOPY WITH MEDIAL MENISECTOMY Left 11/26/2017   Procedure: KNEE ARTHROSCOPY WITH MEDIAL MENISECTOMY;  Surgeon: Corky Mull, MD;  Location: [facility];  Service: Orthopedics;  Laterality: Left;  . LAPAROSCOPIC CHOLECYSTECTOMY  05-13-2002   Gall Bladder  . LEFT BREAST DUCTAL EXCISION  10-11-2009   BENIGN  . LEFT HAND  SURG.  1980'S   EXTENSIVE REPAIR AND REMOVAL OF LIGAMENT/TENDON  (?CANCER)  . LUMBAR LAMINECTOMY  1990  &  1976   RIGHT SIDE L4 - 5  & L5 - S1  . POLYPECTOMY  04/17/2016   Procedure: POLYPECTOMY;  Surgeon: Lucilla Lame, MD;  Location: [facility];  Service: Endoscopy;;  . REMOVAL RIGHT GOIN MASS  1980'S   BENIGN  . REMOVAL TOE NAILBED  AGE 41   ALL 10 TOES-- DEFORMED  . RIGHT BREAST LUMPECTOMY     BENIGN  . RIGHT FOOT SURG  X3   REPAIR OF 2 TOES  . ROTATOR CUFF REPAIR  04-11-2008   LEFT SHOULDER  . SPINE SURGERY    . THYROID GOITER REMOVED  1970'S  . TONSILLECTOMY  CHILD  . VAGINAL HYSTERECTOMY/ ANTERIOR & POSTERIOR REPAIR/ TRANSOBTURATOR SLING  04-25-2005   CYSTOCELE/ RECTOCELE/ UTERINE PROLAPSE/ SUI    Family History  Problem Relation Age of Onset  . Deep vein thrombosis Mother        Right Leg  . Heart disease Mother        Before age 22-  CHF  . Varicose Veins Mother   . Heart attack Mother   . Heart disease Father        Before age 29-  PVD  . Heart attack Father   . Arthritis Father        Gout  . Hypertension Father   . Cancer Sister        Thyroid-Back  . Cancer Sister        Breast and Lung  . Heart disease Sister   . Heart disease Sister   . Other Sister        platelet disorder  . Colon cancer Maternal Grandmother   . Breast cancer Maternal Grandmother   . Heart attack Other        father's side multiple family members  . Stroke Other        father's side multiple family members  . Colon cancer Maternal Uncle   . Diabetes Maternal Uncle   . Diabetes Maternal Aunt    Social History   Socioeconomic History  . Marital status: Divorced    Spouse name: Not on file  . Number of children: 3  . Years of education: Not on file  . Highest education level: 8th grade  Occupational History  . Not on file  Tobacco Use  . Smoking status: Former Smoker    Years: 40.00    Types: Cigarettes    Quit date: 1993    Years since quitting: 28.9  .  Smokeless tobacco: Never Used  Vaping Use  . Vaping Use: Never used  Substance and Sexual Activity  . Alcohol use: Never  . Drug use: Never  . Sexual activity: Not on file  Other Topics Concern  . Not  on file  Social History Narrative   Lives alone   Retired   Caffeine: coffee 3 cups daily   Social Determinants of Health   Financial Resource Strain:   . Difficulty of Paying Living Expenses: Not on file  Food Insecurity:   . Worried About Charity fundraiser in the Last Year: Not on file  . Ran Out of Food in the Last Year: Not on file  Transportation Needs:   . Lack of Transportation (Medical): Not on file  . Lack of Transportation (Non-Medical): Not on file  Physical Activity:   . Days of Exercise per Week: Not on file  . Minutes of Exercise per Session: Not on file  Stress:   . Feeling of Stress : Not on file  Social Connections:   . Frequency of Communication with Friends and Family: Not on file  . Frequency of Social Gatherings with Friends and Family: Not on file  . Attends Religious Services: Not on file  . Active Member of Clubs or Organizations: Not on file  . Attends Archivist Meetings: Not on file  . Marital Status: Not on file    Review of Systems  Constitutional: Negative for activity change, appetite change, fatigue and fever.  Eyes: Positive for visual disturbance.  Respiratory: Negative for cough, choking and shortness of breath.   Cardiovascular: Negative for chest pain and leg swelling.  Gastrointestinal: Positive for constipation.  Genitourinary: Negative for difficulty urinating and dyspareunia.  Skin: Positive for rash (feet).  Psychiatric/Behavioral: Negative.      Objective:  BP 140/80 (BP Location: Left Arm, Patient Position: Sitting, Cuff Size: Normal)   Pulse 85   Temp (!) 97.2 F (36.2 C) (Temporal)   Resp 16   Ht 5\' 5"  (1.651 m)   Wt 158 lb 12.8 oz (72 kg)   SpO2 96%   BMI 26.43 kg/m   BP/Weight 11/05/2020 08/17/2020  7/35/3299  Systolic BP 242 683 -  Diastolic BP 80 70 -  Wt. (Lbs) 158.8 - 160  BMI 26.43 - 26.63    Physical Exam Vitals reviewed.  Constitutional:      Appearance: Normal appearance.  HENT:     Right Ear: Tympanic membrane normal.     Left Ear: Tympanic membrane normal.     Mouth/Throat:     Pharynx: Oropharynx is clear.  Eyes:     Extraocular Movements: Extraocular movements intact.     Conjunctiva/sclera: Conjunctivae normal.     Pupils: Pupils are equal, round, and reactive to light.  Cardiovascular:     Rate and Rhythm: Normal rate and regular rhythm.     Pulses: Normal pulses.     Heart sounds: Normal heart sounds.  Pulmonary:     Effort: Pulmonary effort is normal.  Abdominal:     General: Abdomen is flat. Bowel sounds are normal.     Palpations: Abdomen is soft.  Musculoskeletal:        General: Normal range of motion.     Cervical back: Normal range of motion and neck supple.  Skin:    General: Skin is warm and dry.  Neurological:     General: No focal deficit present.     Mental Status: She is alert and oriented to person, place, and time.       Lab Results  Component Value Date   WBC 7.8 07/03/2020   HGB 15.0 07/03/2020   HCT 45.3 07/03/2020   PLT 301 07/03/2020   GLUCOSE 83 07/03/2020  CHOL 184 03/01/2020   TRIG 85 03/01/2020   HDL 64 03/01/2020   LDLCALC 104 (H) 03/01/2020   ALT 23 07/03/2020   AST 22 07/03/2020   NA 141 07/03/2020   K 4.6 07/03/2020   CL 101 07/03/2020   CREATININE 0.93 07/03/2020   BUN 20 07/03/2020   CO2 24 07/03/2020   TSH 3.480 07/03/2020   INR 1.16 04/03/2016      Assessment & Plan:    Diagnoses and all orders for this visit: Vitamin B12 deficiency due to intestinal malabsorption AN INDIVIDUAL CARE PLAN B12 deficiency was established and reinforced today.  The patient's status was assessed using clinical findings on exam, labs, and other diagnostic testing. Patient's success at meeting treatment goals based on  disease specific evidence-bassed guidelines and found to be in good control. RECOMMENDATIONS include maintaining present medicines and treatment.  Other specified hypothyroidism -     CBC with Differential/Platelet -     Comprehensive metabolic panel -     TSH Patient is known to have hypothyroidism and is n treatment with levothyroxine.123mcg  Patient was diagnosed 10 years ago.  Other treatment includes none.  Patient is compliant with medicines and last TSH 6 months ago.  Last TSH was normal . Peripheral vascular disease ([place]) -     CBC with Differential/Platelet AN INDIVIDUAL CARE PLAN for PVD was established and reinforced today.  The patient's status was assessed using clinical findings on exam, labs, and other diagnostic testing. Patient's success at meeting treatment goals based on disease specific evidence-bassed guidelines and found to be in [place] control. RECOMMENDATIONS include maintaining present medicines and treatment.she may need further vascular work   Gastroesophageal reflux disease, unspecified whether esophagitis present Plan of care was formulated today.  She is doing well.  A plan of care was formulated using patient exam, tests and other sources to optimize care using evidence based information.  Recommend no smoking, no eating after supper, avoid fatty foods, elevate Head of bed, avoid tight fitting clothing.  Continue on OTC . Eye hemorrhage, left -     CBC with Differential/Platelet -     Comprehensive metabolic panel She is seeing ophthalmologist, she has no left eye vision now.  Chronic constipation -     Comprehensive metabolic panel AN INDIVIDUAL CARE PLAN for constipation was established and reinforced today.  The patient's status was assessed using clinical findings on exam, labs, and other diagnostic testing. Patient's success at meeting treatment goals based on disease specific evidence-bassed guidelines and found to be in good control. RECOMMENDATIONS  include start Linzess 4 boxes. Erythrasma -     clotrimazole-betamethasone (LOTRISONE) cream; Apply 1 application topically 2 (two) times daily. -     clarithromycin (BIAXIN) 500 MG tablet; Take 1 tablet (500 mg total) by mouth 2 (two) times daily. Patient has rash on both feet.  Treat with antifungal and antibiotics.         I spent 30 minutes dedicated to the care of this patient on the date of this encounter to include face-to-face time with the patient, as well as: review of chart and discussion of eye hemorrhage.  We will try to get notes since I have no records.  Follow-up: Return in about 6 months (around 05/05/2021) for fasting.  An After Visit Summary was printed and given to the patient.  Reinaldo Meeker, MD [facility] [phone]

## 2020-11-06 LAB — CBC WITH DIFFERENTIAL/PLATELET
Basophils Absolute: 0.1 10*3/uL (ref 0.0–0.2)
Basos: 1 %
EOS (ABSOLUTE): 0.2 10*3/uL (ref 0.0–0.4)
Eos: 3 %
Hematocrit: 44 % (ref 34.0–46.6)
Hemoglobin: 14.9 g/dL (ref 11.1–15.9)
Immature Grans (Abs): 0 10*3/uL (ref 0.0–0.1)
Immature Granulocytes: 0 %
Lymphocytes Absolute: 1.7 10*3/uL (ref 0.7–3.1)
Lymphs: 27 %
MCH: 31.2 pg (ref 26.6–33.0)
MCHC: 33.9 g/dL (ref 31.5–35.7)
MCV: 92 fL (ref 79–97)
Monocytes Absolute: 0.6 10*3/uL (ref 0.1–0.9)
Monocytes: 9 %
Neutrophils Absolute: 3.9 10*3/uL (ref 1.4–7.0)
Neutrophils: 60 %
Platelets: 220 10*3/uL (ref 150–450)
RBC: 4.77 x10E6/uL (ref 3.77–5.28)
RDW: 12.7 % (ref 11.7–15.4)
WBC: 6.5 10*3/uL (ref 3.4–10.8)

## 2020-11-06 LAB — COMPREHENSIVE METABOLIC PANEL
ALT: 13 IU/L (ref 0–32)
AST: 20 IU/L (ref 0–40)
Albumin/Globulin Ratio: 2 (ref 1.2–2.2)
Albumin: 4.5 g/dL (ref 3.7–4.7)
Alkaline Phosphatase: 92 IU/L (ref 44–121)
BUN/Creatinine Ratio: 24 (ref 12–28)
BUN: 20 mg/dL (ref 8–27)
Bilirubin Total: 0.4 mg/dL (ref 0.0–1.2)
CO2: 23 mmol/L (ref 20–29)
Calcium: 9.9 mg/dL (ref 8.7–10.3)
Chloride: 104 mmol/L (ref 96–106)
Creatinine, Ser: 0.82 mg/dL (ref 0.57–1.00)
GFR calc Af Amer: 83 mL/min/{1.73_m2} (ref >59)
GFR calc non Af Amer: 72 mL/min/{1.73_m2} (ref >59)
Globulin, Total: 2.3 g/dL (ref 1.5–4.5)
Glucose: 89 mg/dL (ref 65–99)
Potassium: 4.6 mmol/L (ref 3.5–5.2)
Sodium: 142 mmol/L (ref 134–144)
Total Protein: 6.8 g/dL (ref 6.0–8.5)

## 2020-11-06 LAB — TSH: TSH: 2.21 u[IU]/mL (ref 0.450–4.500)

## 2020-11-06 NOTE — Progress Notes (Signed)
CBC normal, kidney and liver tests normal, TSH 2.21 normal,  lp

## 2020-11-29 ENCOUNTER — Other Ambulatory Visit: Payer: Self-pay | Admitting: Legal Medicine

## 2020-12-03 ENCOUNTER — Other Ambulatory Visit: Payer: Self-pay | Admitting: Internal Medicine

## 2020-12-03 DIAGNOSIS — R911 Solitary pulmonary nodule: Secondary | ICD-10-CM

## 2020-12-03 DIAGNOSIS — D518 Other vitamin B12 deficiency anemias: Secondary | ICD-10-CM

## 2020-12-03 DIAGNOSIS — R17 Unspecified jaundice: Secondary | ICD-10-CM

## 2021-01-03 DIAGNOSIS — H353221 Exudative age-related macular degeneration, left eye, with active choroidal neovascularization: Secondary | ICD-10-CM | POA: Diagnosis not present

## 2021-01-03 DIAGNOSIS — H3562 Retinal hemorrhage, left eye: Secondary | ICD-10-CM | POA: Diagnosis not present

## 2021-01-04 ENCOUNTER — Other Ambulatory Visit: Payer: Self-pay | Admitting: Legal Medicine

## 2021-01-08 ENCOUNTER — Other Ambulatory Visit: Payer: Self-pay | Admitting: Legal Medicine

## 2021-01-29 DIAGNOSIS — H3562 Retinal hemorrhage, left eye: Secondary | ICD-10-CM | POA: Diagnosis not present

## 2021-01-29 DIAGNOSIS — H2512 Age-related nuclear cataract, left eye: Secondary | ICD-10-CM | POA: Diagnosis not present

## 2021-02-12 HISTORY — PX: CATARACT EXTRACTION W/ INTRAOCULAR LENS IMPLANT & ANTERIOR VITRECTOMY, BILATERAL: SHX1310

## 2021-02-28 DIAGNOSIS — H353221 Exudative age-related macular degeneration, left eye, with active choroidal neovascularization: Secondary | ICD-10-CM | POA: Diagnosis not present

## 2021-03-01 ENCOUNTER — Other Ambulatory Visit: Payer: Self-pay | Admitting: Internal Medicine

## 2021-03-13 DIAGNOSIS — H2511 Age-related nuclear cataract, right eye: Secondary | ICD-10-CM | POA: Diagnosis not present

## 2021-05-08 ENCOUNTER — Ambulatory Visit: Payer: Medicare Other | Admitting: Legal Medicine

## 2021-05-28 ENCOUNTER — Ambulatory Visit (INDEPENDENT_AMBULATORY_CARE_PROVIDER_SITE_OTHER): Payer: Medicare Other | Admitting: Legal Medicine

## 2021-05-28 ENCOUNTER — Telehealth: Payer: Self-pay

## 2021-05-28 ENCOUNTER — Encounter: Payer: Self-pay | Admitting: Legal Medicine

## 2021-05-28 ENCOUNTER — Other Ambulatory Visit: Payer: Self-pay | Admitting: Legal Medicine

## 2021-05-28 ENCOUNTER — Other Ambulatory Visit: Payer: Self-pay

## 2021-05-28 VITALS — BP 114/58 | HR 71 | Temp 96.1°F | Resp 16 | Ht 65.0 in | Wt 168.0 lb

## 2021-05-28 DIAGNOSIS — H3530 Unspecified macular degeneration: Secondary | ICD-10-CM | POA: Insufficient documentation

## 2021-05-28 DIAGNOSIS — I10 Essential (primary) hypertension: Secondary | ICD-10-CM

## 2021-05-28 DIAGNOSIS — I480 Paroxysmal atrial fibrillation: Secondary | ICD-10-CM

## 2021-05-28 DIAGNOSIS — R600 Localized edema: Secondary | ICD-10-CM | POA: Diagnosis not present

## 2021-05-28 DIAGNOSIS — E782 Mixed hyperlipidemia: Secondary | ICD-10-CM | POA: Diagnosis not present

## 2021-05-28 DIAGNOSIS — I82462 Acute embolism and thrombosis of left calf muscular vein: Secondary | ICD-10-CM

## 2021-05-28 DIAGNOSIS — I739 Peripheral vascular disease, unspecified: Secondary | ICD-10-CM

## 2021-05-28 DIAGNOSIS — G609 Hereditary and idiopathic neuropathy, unspecified: Secondary | ICD-10-CM | POA: Diagnosis not present

## 2021-05-28 DIAGNOSIS — L02416 Cutaneous abscess of left lower limb: Secondary | ICD-10-CM

## 2021-05-28 DIAGNOSIS — K219 Gastro-esophageal reflux disease without esophagitis: Secondary | ICD-10-CM

## 2021-05-28 DIAGNOSIS — H353231 Exudative age-related macular degeneration, bilateral, with active choroidal neovascularization: Secondary | ICD-10-CM

## 2021-05-28 DIAGNOSIS — I83899 Varicose veins of unspecified lower extremities with other complications: Secondary | ICD-10-CM

## 2021-05-28 DIAGNOSIS — D509 Iron deficiency anemia, unspecified: Secondary | ICD-10-CM

## 2021-05-28 DIAGNOSIS — L03116 Cellulitis of left lower limb: Secondary | ICD-10-CM | POA: Diagnosis not present

## 2021-05-28 DIAGNOSIS — E038 Other specified hypothyroidism: Secondary | ICD-10-CM | POA: Diagnosis not present

## 2021-05-28 DIAGNOSIS — I82409 Acute embolism and thrombosis of unspecified deep veins of unspecified lower extremity: Secondary | ICD-10-CM | POA: Insufficient documentation

## 2021-05-28 DIAGNOSIS — M79662 Pain in left lower leg: Secondary | ICD-10-CM | POA: Diagnosis not present

## 2021-05-28 MED ORDER — CEPHALEXIN 500 MG PO CAPS: 500.0000 mg | ORAL_CAPSULE | Freq: Four times a day (QID) | ORAL | 0 refills | Status: DC

## 2021-05-28 NOTE — Telephone Encounter (Signed)
I gave the results of vascular ultrasound was negative. Patient understood. She will take antibiotics.

## 2021-05-28 NOTE — Progress Notes (Signed)
Established Patient Office Visit  Subjective:  Patient ID: Sydney Ayala, female    DOB: [birth year]  Age: 75 y.o. MRN: [id number]  CC:  Chief Complaint  Patient presents with   Hypothyroidism   Hyperlipidemia   Leg Swelling    Left leg swelling since 4 days ago.    HPI Sydney Ayala presents for chronic visit  Patient has HYPOTHYROIDISM.  Diagnosed 10 years ago.  Patient has stable thyroid readings.  Patient is having no symptoms.  Last TSH was normal.  continue dosage of thyroid medicine.   Patient presents with hyperlipidemia.  Compliance with treatment has been good; patient takes medicines as directed, maintains low cholesterol diet, follows up as directed, and maintains exercise regimen.  Patient is using diet without problems.   Swelling left leg for 4 to 5 days, red and swollen  Patient has paroxymal atrial fibrillation no blood thinners  Past Medical History:  Diagnosis Date   Chronic venous insufficiency    Dysrhythmia    A Fib during hospital stay 04/03/16   History of rheumatic fever CHILDHOOD   Hypothyroidism    Injury of tendon of long head of right biceps 2018   Pinched nerve in neck     Past Surgical History:  Procedure Laterality Date   ABDOMINAL HYSTERECTOMY     CARDIOVASCULAR STRESS TEST  03/15/2008   NORMAL LVSF/ EF 61%/ NO ISCHEMIA   CATARACT EXTRACTION W/ INTRAOCULAR LENS IMPLANT & ANTERIOR VITRECTOMY, BILATERAL Bilateral 02/2021   COLONOSCOPY WITH PROPOFOL N/A 04/17/2016   Procedure: COLONOSCOPY WITH PROPOFOL;  Surgeon: Lucilla Lame, MD;  Location: [place];  Service: Endoscopy;  Laterality: N/A;   DILATION AND CURETTAGE OF UTERUS     ESOPHAGOGASTRODUODENOSCOPY (EGD) WITH PROPOFOL N/A 04/04/2016   Procedure: ESOPHAGOGASTRODUODENOSCOPY (EGD) WITH PROPOFOL;  Surgeon: Lucilla Lame, MD;  Location: [facility];  Service: Endoscopy;  Laterality: N/A;   GANGLION CYST EXCISION  06/09/2012   Procedure: REMOVAL GANGLION OF WRIST;  Surgeon:  Magnus Sinning, MD;  Location: [place];  Service: Orthopedics;  Laterality: Left;  EXCISION OF GANGLION CYST LEFT RING FINGER  IV REGIONAL ANESTHESIA   HEMRROIDECTOMY AND ANAL FISSURE REPAIR  12/16/1963   KNEE ARTHROSCOPY WITH MEDIAL MENISECTOMY Left 11/26/2017   Procedure: KNEE ARTHROSCOPY WITH MEDIAL MENISECTOMY;  Surgeon: Corky Mull, MD;  Location: [facility];  Service: Orthopedics;  Laterality: Left;   LAPAROSCOPIC CHOLECYSTECTOMY  05/13/2002   Gall Bladder   LEFT BREAST DUCTAL EXCISION  10/11/2009   BENIGN   LEFT HAND SURG.  08/16/1979   EXTENSIVE REPAIR AND REMOVAL OF LIGAMENT/TENDON  (?CANCER)   LUMBAR LAMINECTOMY  1990  &  1976   RIGHT SIDE L4 - 5  & L5 - S1   POLYPECTOMY  04/17/2016   Procedure: POLYPECTOMY;  Surgeon: Lucilla Lame, MD;  Location: [place];  Service: Endoscopy;;   REMOVAL RIGHT GOIN MASS  08/16/1979   BENIGN   REMOVAL TOE NAILBED  AGE 37   ALL 10 TOES-- DEFORMED   RIGHT BREAST LUMPECTOMY     BENIGN   RIGHT FOOT SURG  X3   REPAIR OF 2 TOES   ROTATOR CUFF REPAIR  04/11/2008   LEFT SHOULDER   SPINE SURGERY     THYROID GOITER REMOVED  08/15/1969   TONSILLECTOMY  CHILD   VAGINAL HYSTERECTOMY/ ANTERIOR & POSTERIOR REPAIR/ TRANSOBTURATOR SLING  04/25/2005   CYSTOCELE/ RECTOCELE/ UTERINE PROLAPSE/ SUI    Family History  Problem Relation Age of  Onset   Deep vein thrombosis Mother        Right Leg   Heart disease Mother        Before age 59-  CHF   Varicose Veins Mother    Heart attack Mother    Heart disease Father        Before age 72-  PVD   Heart attack Father    Arthritis Father        Gout   Hypertension Father    Cancer Sister        Thyroid-Back   Cancer Sister        Breast and Lung   Heart disease Sister    Heart disease Sister    Other Sister        platelet disorder   Colon cancer Maternal Grandmother    Breast cancer Maternal Grandmother    Heart attack Other        father's side multiple family  members   Stroke Other        father's side multiple family members   Colon cancer Maternal Uncle    Diabetes Maternal Uncle    Diabetes Maternal Aunt     Social History   Socioeconomic History   Marital status: Divorced    Spouse name: Not on file   Number of children: 3   Years of education: Not on file   Highest education level: 8th grade  Occupational History   Not on file  Tobacco Use   Smoking status: Former    Years: 40.00    Pack years: 0.00    Types: Cigarettes    Quit date: 1993    Years since quitting: 29.4   Smokeless tobacco: Never  Vaping Use   Vaping Use: Never used  Substance and Sexual Activity   Alcohol use: Never   Drug use: Never   Sexual activity: Not on file  Other Topics Concern   Not on file  Social History Narrative   Lives alone   Retired   Caffeine: coffee 3 cups daily   Social Determinants of Health   Financial Resource Strain: Not on file  Food Insecurity: Not on file  Transportation Needs: Not on file  Physical Activity: Not on file  Stress: Not on file  Social Connections: Not on file  Intimate Partner Violence: Not on file    Outpatient Medications Prior to Visit  Medication Sig Dispense Refill   diltiazem (TIAZAC) 120 MG 24 hr capsule Take 1 capsule by mouth daily.     acetaminophen (TYLENOL) 500 MG tablet Take 1,000 mg by mouth every 8 (eight) hours as needed for mild pain.     albuterol (VENTOLIN HFA) 108 (90 Base) MCG/ACT inhaler Inhale 2 puffs into the lungs every 6 (six) hours as needed for wheezing or shortness of breath. 8 g 3   BD SYRINGE SLIP TIP 25G X 5/8" 1 ML MISC USE AS DIRECTED - ONCE A MONTH 4 each 3   clarithromycin (BIAXIN) 500 MG tablet Take 1 tablet (500 mg total) by mouth 2 (two) times daily. 20 tablet 0   clotrimazole-betamethasone (LOTRISONE) cream Apply 1 application topically 2 (two) times daily. 30 g 2   cyanocobalamin (,VITAMIN B-12,) 1000 MCG/ML injection INJECT 1 ML MONTHLY 3 mL 3   diltiazem  (CARDIZEM CD) 120 MG 24 hr capsule TAKE ONE CAPSULE BY MOUTH EVERY DAY     Multiple Vitamins-Minerals ([employer] SPECTRAVITE ADULT 50+) TABS Take 1 tablet by mouth daily.  Multiple Vitamins-Minerals (ICAPS AREDS FORMULA PO) Take 1 tablet by mouth daily.     naproxen (NAPROSYN) 500 MG tablet TAKE 1 TABLET BY MOUTH EVERY DAY WITH FOOD 30 tablet 6   PARoxetine (PAXIL) 10 MG tablet TAKE 1 TABLET BY MOUTH EVERY DAY 90 tablet 2   potassium chloride SA (K-DUR,KLOR-CON) 20 MEQ tablet Take 1 tablet (20 mEq total) by mouth once. (Patient taking differently: Take 20 mEq by mouth. ) 30 tablet 3   Probiotic Product (DIGESTIVE ADVANTAGE PO) Take 1 each by mouth every other day.     SYNTHROID 125 MCG tablet TAKE 1 TABLET BY MOUTH EVERY DAY 90 tablet 2   Syringe/Needle, Disp, (SYRINGE 3CC/22GX1") 22G X 1" 3 ML MISC 1 Syringe by Does not apply route once. Please dispense syringe/needle for b12 injections. Pt will self administer her own injections. 50 each 4   gabapentin (NEURONTIN) 100 MG capsule TAKE 1 CAPSULE (100 MG TOTAL) BY MOUTH AT BEDTIME AS NEEDED. 90 capsule 2   No facility-administered medications prior to visit.    Allergies  Allergen Reactions   Latex Rash    Pt denies.  Reports allergy to adhesives, not latex   Codeine Nausea And Vomiting   Contrast Media [Iodinated Diagnostic Agents] Nausea And Vomiting and Other (See Comments)    "CONVULSIONS"   Indomethacin Nausea And Vomiting and Other (See Comments)    "CONVULSIONS"   Adhesive [Tape] Rash and Other (See Comments)    BURNS SKIN    ROS Review of Systems  Constitutional:  Negative for activity change, appetite change and diaphoresis.  HENT:  Negative for congestion and hearing loss.   Eyes:  Positive for visual disturbance.  Respiratory:  Negative for cough and chest tightness.   Cardiovascular:  Positive for leg swelling. Negative for chest pain and palpitations.  Gastrointestinal:  Negative for abdominal distention and abdominal pain.   Genitourinary:  Negative for difficulty urinating and dysuria.  Musculoskeletal:  Negative for arthralgias and back pain.     Objective:    Physical Exam Vitals reviewed.  Constitutional:      Appearance: Normal appearance.  HENT:     Head: Normocephalic.     Right Ear: Tympanic membrane, ear canal and external ear normal.     Left Ear: Tympanic membrane, ear canal and external ear normal.     Mouth/Throat:     Mouth: Mucous membranes are moist.     Pharynx: Oropharynx is clear.  Eyes:     Extraocular Movements: Extraocular movements intact.     Conjunctiva/sclera: Conjunctivae normal.     Pupils: Pupils are equal, round, and reactive to light.  Cardiovascular:     Rate and Rhythm: Normal rate and regular rhythm.     Pulses: Normal pulses.     Heart sounds: No murmur heard.   No gallop.  Pulmonary:     Effort: Pulmonary effort is normal. No respiratory distress.     Breath sounds: No wheezing.  Abdominal:     General: Abdomen is flat. Bowel sounds are normal. There is no distension.     Palpations: Abdomen is soft.     Tenderness: There is no abdominal tenderness.  Musculoskeletal:     Cervical back: Normal range of motion and neck supple.     Left lower leg: Edema present.  Skin:    Capillary Refill: Capillary refill takes less than 2 seconds.     Findings: Erythema (left leg) present.     Comments: Left leg swollen and  tender with erythema  Neurological:     General: No focal deficit present.     Mental Status: She is alert and oriented to person, place, and time. Mental status is at baseline.  Psychiatric:        Mood and Affect: Mood normal.        Thought Content: Thought content normal.        Judgment: Judgment normal.    BP (!) 114/58   Pulse 71   Temp (!) 96.1 F (35.6 C)   Resp 16   Ht 5\' 5"  (1.651 m)   Wt 168 lb (76.2 kg)   SpO2 93%   BMI 27.96 kg/m  Wt Readings from Last 3 Encounters:  05/28/21 168 lb (76.2 kg)  11/05/20 158 lb 12.8 oz (72  kg)  08/14/20 160 lb (72.6 kg)     Health Maintenance Due  Topic Date Due   DEXA SCAN  Never done   TETANUS/TDAP  Never done   Zoster Vaccines- Shingrix (1 of 2) Never done   MAMMOGRAM  01/15/2019   PNA vac Low Risk Adult (2 of 2 - PPSV23) 11/23/2019   COLONOSCOPY (Pts 45-63yrs Insurance coverage will need to be confirmed)  04/17/2021    There are no preventive care reminders to display for this patient.  Lab Results  Component Value Date   TSH 2.210 11/05/2020   Lab Results  Component Value Date   WBC 6.5 11/05/2020   HGB 14.9 11/05/2020   HCT 44.0 11/05/2020   MCV 92 11/05/2020   PLT 220 11/05/2020   Lab Results  Component Value Date   NA 142 11/05/2020   K 4.6 11/05/2020   CO2 23 11/05/2020   GLUCOSE 89 11/05/2020   BUN 20 11/05/2020   CREATININE 0.82 11/05/2020   BILITOT 0.4 11/05/2020   ALKPHOS 92 11/05/2020   AST 20 11/05/2020   ALT 13 11/05/2020   PROT 6.8 11/05/2020   ALBUMIN 4.5 11/05/2020   CALCIUM 9.9 11/05/2020   ANIONGAP 10 01/03/2019   Lab Results  Component Value Date   CHOL 184 03/01/2020   Lab Results  Component Value Date   HDL 64 03/01/2020   Lab Results  Component Value Date   LDLCALC 104 (H) 03/01/2020   Lab Results  Component Value Date   TRIG 85 03/01/2020   Lab Results  Component Value Date   CHOLHDL 2.9 03/01/2020   No results found for: HGBA1C    Assessment & Plan:   Problem List Items Addressed This Visit       Cardiovascular and Mediastinum   Paroxysmal atrial fibrillation (HCC)   Relevant Medications   diltiazem (TIAZAC) 120 MG 24 hr capsule Patient has a diagnosis of paroxysmal atrial fibrillation.   Patient is on no anticoagulation and has controlled ventricular response.  Patient is CV stable .    Peripheral vascular disease (HCC)   Relevant Medications   diltiazem (TIAZAC) 120 MG 24 hr capsule AN INDIVIDUAL CARE PLAN for PVD was established and reinforced today.  The patient's status was assessed using  clinical findings on exam, labs, and other diagnostic testing. Patient's success at meeting treatment goals based on disease specific evidence-bassed guidelines and found to be in good control. RECOMMENDATIONS include maintaining present medicines and treatment.      Digestive   GERD (gastroesophageal reflux disease) Plan of care was formulated today.  She is doing well.  A plan of care was formulated using patient exam, tests and other sources to  optimize care using evidence based information.  Recommend no smoking, no eating after supper, avoid fatty foods, elevate Head of bed, avoid tight fitting clothing.  Continue on OTC.      Endocrine   Hypothyroidism - Primary Patient is known to have hypothyroidism and is n treatment with levothyroxine 141mcg.  Patient was diagnosed 10 years ago.  Other treatment includes none.  Patient is compliant with medicines and last TSH 6 months ago.  Last TSH was normal.      Nervous and Auditory   Peripheral neuropathy AN INDIVIDUAL CARE PLAN for peripheral neuropathy was established and reinforced today.  The patient's status was assessed using clinical findings on exam, labs, and other diagnostic testing. Patient's success at meeting treatment goals based on disease specific evidence-bassed guidelines and found to be in good control. RECOMMENDATIONS include maintaining present medicines and treatment.    Other Visit Diagnoses     Mixed hyperlipidemia       Relevant Medications   diltiazem (TIAZAC) 120 MG 24 hr capsule AN INDIVIDUAL CARE PLAN for hyperlipidemia/ cholesterol was established and reinforced today.  The patient's status was assessed using clinical findings on exam, lab and other diagnostic tests. The patient's disease status was assessed based on evidence-based guidelines and found to be fair controlled. MEDICATIONS were reviewed. SELF MANAGEMENT GOALS have been discussed and patient's success at attaining the goal of low cholesterol was  assessed. RECOMMENDATION given include regular exercise 3 days a week and low cholesterol/low fat diet. CLINICAL SUMMARY including written plan to identify barriers unique to the patient due to social or economic  reasons was discussed.      DVT- patient to get stat venous dopplers today  Cellulitis left leg- start keflex qid for 10 days    Follow-up: Return in about 1 week (around 06/04/2021).    Reinaldo Meeker, MD

## 2021-05-29 LAB — LIPID PANEL
Chol/HDL Ratio: 3.1 ratio (ref 0.0–4.4)
Cholesterol, Total: 156 mg/dL (ref 100–199)
HDL: 51 mg/dL (ref >39)
LDL Chol Calc (NIH): 92 mg/dL (ref 0–99)
Triglycerides: 67 mg/dL (ref 0–149)
VLDL Cholesterol Cal: 13 mg/dL (ref 5–40)

## 2021-05-29 LAB — COMPREHENSIVE METABOLIC PANEL
ALT: 14 IU/L (ref 0–32)
AST: 16 IU/L (ref 0–40)
Albumin/Globulin Ratio: 1.6 (ref 1.2–2.2)
Albumin: 4.1 g/dL (ref 3.7–4.7)
Alkaline Phosphatase: 98 IU/L (ref 44–121)
BUN/Creatinine Ratio: 27 (ref 12–28)
BUN: 25 mg/dL (ref 8–27)
Bilirubin Total: 0.4 mg/dL (ref 0.0–1.2)
CO2: 19 mmol/L — ABNORMAL LOW (ref 20–29)
Calcium: 9.5 mg/dL (ref 8.7–10.3)
Chloride: 103 mmol/L (ref 96–106)
Creatinine, Ser: 0.93 mg/dL (ref 0.57–1.00)
Globulin, Total: 2.5 g/dL (ref 1.5–4.5)
Glucose: 86 mg/dL (ref 65–99)
Potassium: 4.5 mmol/L (ref 3.5–5.2)
Sodium: 140 mmol/L (ref 134–144)
Total Protein: 6.6 g/dL (ref 6.0–8.5)
eGFR: 65 mL/min/{1.73_m2} (ref >59)

## 2021-05-29 LAB — CBC WITH DIFFERENTIAL/PLATELET
Basophils Absolute: 0.1 10*3/uL (ref 0.0–0.2)
Basos: 1 %
EOS (ABSOLUTE): 0.3 10*3/uL (ref 0.0–0.4)
Eos: 5 %
Hematocrit: 38.6 % (ref 34.0–46.6)
Hemoglobin: 12.8 g/dL (ref 11.1–15.9)
Immature Grans (Abs): 0 10*3/uL (ref 0.0–0.1)
Immature Granulocytes: 0 %
Lymphocytes Absolute: 1.3 10*3/uL (ref 0.7–3.1)
Lymphs: 22 %
MCH: 30.5 pg (ref 26.6–33.0)
MCHC: 33.2 g/dL (ref 31.5–35.7)
MCV: 92 fL (ref 79–97)
Monocytes Absolute: 0.6 10*3/uL (ref 0.1–0.9)
Monocytes: 10 %
Neutrophils Absolute: 3.7 10*3/uL (ref 1.4–7.0)
Neutrophils: 62 %
Platelets: 211 10*3/uL (ref 150–450)
RBC: 4.2 x10E6/uL (ref 3.77–5.28)
RDW: 12.3 % (ref 11.7–15.4)
WBC: 5.9 10*3/uL (ref 3.4–10.8)

## 2021-05-29 LAB — TSH: TSH: 4.93 u[IU]/mL — ABNORMAL HIGH (ref 0.450–4.500)

## 2021-05-29 LAB — CARDIOVASCULAR RISK ASSESSMENT

## 2021-05-29 NOTE — Progress Notes (Signed)
Kidney and liver tests normal, cholesterol normal, CBC normal, TSH 4.93 slightly high- we will watch lp

## 2021-06-03 ENCOUNTER — Encounter: Payer: Self-pay | Admitting: Legal Medicine

## 2021-06-03 ENCOUNTER — Ambulatory Visit (INDEPENDENT_AMBULATORY_CARE_PROVIDER_SITE_OTHER): Payer: Medicare Other | Admitting: Legal Medicine

## 2021-06-03 ENCOUNTER — Other Ambulatory Visit: Payer: Self-pay

## 2021-06-03 VITALS — BP 130/60 | HR 75 | Temp 98.1°F | Resp 16 | Ht 65.0 in | Wt 171.0 lb

## 2021-06-03 DIAGNOSIS — I872 Venous insufficiency (chronic) (peripheral): Secondary | ICD-10-CM

## 2021-06-03 DIAGNOSIS — L03116 Cellulitis of left lower limb: Secondary | ICD-10-CM

## 2021-06-03 DIAGNOSIS — L02416 Cutaneous abscess of left lower limb: Secondary | ICD-10-CM

## 2021-06-03 NOTE — Progress Notes (Signed)
Established Patient Office Visit  Subjective:  Patient ID: Sydney Ayala, female    DOB: 1946/05/12  Age: 75 y.o. MRN: [id number]  CC:  Chief Complaint  Patient presents with   Leg Swelling    Patient is taking Keflex and it is not helping. She noticed redness is going up on her left leg, and it burned and tenderness.    HPI Sydney Ayala presents for follow up.  Dopplers were negative for DVT, she has been on keflex for infections but left leg remains swollen and some redness and tenderness.  Needs UNNA boot  Past Medical History:  Diagnosis Date   Chronic venous insufficiency    Dysrhythmia    A Fib during hospital stay 04/03/16   History of rheumatic fever CHILDHOOD   Hypothyroidism    Injury of tendon of long head of right biceps 2018   Pinched nerve in neck     Past Surgical History:  Procedure Laterality Date   ABDOMINAL HYSTERECTOMY     CARDIOVASCULAR STRESS TEST  03/15/2008   NORMAL LVSF/ EF 61%/ NO ISCHEMIA   CATARACT EXTRACTION W/ INTRAOCULAR LENS IMPLANT & ANTERIOR VITRECTOMY, BILATERAL Bilateral 02/2021   COLONOSCOPY WITH PROPOFOL N/A 04/17/2016   Procedure: COLONOSCOPY WITH PROPOFOL;  Surgeon: Lucilla Lame, MD;  Location: [place];  Service: Endoscopy;  Laterality: N/A;   DILATION AND CURETTAGE OF UTERUS     ESOPHAGOGASTRODUODENOSCOPY (EGD) WITH PROPOFOL N/A 04/04/2016   Procedure: ESOPHAGOGASTRODUODENOSCOPY (EGD) WITH PROPOFOL;  Surgeon: Lucilla Lame, MD;  Location: [facility];  Service: Endoscopy;  Laterality: N/A;   GANGLION CYST EXCISION  06/09/2012   Procedure: REMOVAL GANGLION OF WRIST;  Surgeon: Magnus Sinning, MD;  Location: [place];  Service: Orthopedics;  Laterality: Left;  EXCISION OF GANGLION CYST LEFT RING FINGER  IV REGIONAL ANESTHESIA   HEMRROIDECTOMY AND ANAL FISSURE REPAIR  12/16/1963   KNEE ARTHROSCOPY WITH MEDIAL MENISECTOMY Left 11/26/2017   Procedure: KNEE ARTHROSCOPY WITH MEDIAL MENISECTOMY;  Surgeon:  Corky Mull, MD;  Location: [facility];  Service: Orthopedics;  Laterality: Left;   LAPAROSCOPIC CHOLECYSTECTOMY  05/13/2002   Gall Bladder   LEFT BREAST DUCTAL EXCISION  10/11/2009   BENIGN   LEFT HAND SURG.  08/16/1979   EXTENSIVE REPAIR AND REMOVAL OF LIGAMENT/TENDON  (?CANCER)   LUMBAR LAMINECTOMY  1990  &  1976   RIGHT SIDE L4 - 5  & L5 - S1   POLYPECTOMY  04/17/2016   Procedure: POLYPECTOMY;  Surgeon: Lucilla Lame, MD;  Location: [place];  Service: Endoscopy;;   REMOVAL RIGHT GOIN MASS  08/16/1979   BENIGN   REMOVAL TOE NAILBED  AGE 54   ALL 10 TOES-- DEFORMED   RIGHT BREAST LUMPECTOMY     BENIGN   RIGHT FOOT SURG  X3   REPAIR OF 2 TOES   ROTATOR CUFF REPAIR  04/11/2008   LEFT SHOULDER   SPINE SURGERY     THYROID GOITER REMOVED  08/15/1969   TONSILLECTOMY  CHILD   VAGINAL HYSTERECTOMY/ ANTERIOR & POSTERIOR REPAIR/ TRANSOBTURATOR SLING  04/25/2005   CYSTOCELE/ RECTOCELE/ UTERINE PROLAPSE/ SUI    Family History  Problem Relation Age of Onset   Deep vein thrombosis Mother        Right Leg   Heart disease Mother        Before age 66-  CHF   Varicose Veins Mother    Heart attack Mother    Heart disease Father  Before age 73-  PVD   Heart attack Father    Arthritis Father        Gout   Hypertension Father    Cancer Sister        Thyroid-Back   Cancer Sister        Breast and Lung   Heart disease Sister    Heart disease Sister    Other Sister        platelet disorder   Colon cancer Maternal Grandmother    Breast cancer Maternal Grandmother    Heart attack Other        father's side multiple family members   Stroke Other        father's side multiple family members   Colon cancer Maternal Uncle    Diabetes Maternal Uncle    Diabetes Maternal Aunt     Social History   Socioeconomic History   Marital status: Divorced    Spouse name: Not on file   Number of children: 3   Years of education: Not on file   Highest education level: 8th  grade  Occupational History   Not on file  Tobacco Use   Smoking status: Former    Years: 40.00    Pack years: 0.00    Types: Cigarettes    Quit date: 1993    Years since quitting: 29.4   Smokeless tobacco: Never  Vaping Use   Vaping Use: Never used  Substance and Sexual Activity   Alcohol use: Never   Drug use: Never   Sexual activity: Not on file  Other Topics Concern   Not on file  Social History Narrative   Lives alone   Retired   Caffeine: coffee 3 cups daily   Social Determinants of Health   Financial Resource Strain: Not on file  Food Insecurity: Not on file  Transportation Needs: Not on file  Physical Activity: Not on file  Stress: Not on file  Social Connections: Not on file  Intimate Partner Violence: Not on file    Outpatient Medications Prior to Visit  Medication Sig Dispense Refill   acetaminophen (TYLENOL) 500 MG tablet Take 1,000 mg by mouth every 8 (eight) hours as needed for mild pain.     BD SYRINGE SLIP TIP 25G X 5/8" 1 ML MISC USE AS DIRECTED - ONCE A MONTH 4 each 3   cephALEXin (KEFLEX) 500 MG capsule Take 1 capsule (500 mg total) by mouth 4 (four) times daily. 40 capsule 0   clotrimazole-betamethasone (LOTRISONE) cream Apply 1 application topically 2 (two) times daily. 30 g 2   cyanocobalamin (,VITAMIN B-12,) 1000 MCG/ML injection INJECT 1 ML MONTHLY 3 mL 3   diltiazem (TIAZAC) 120 MG 24 hr capsule Take 1 capsule by mouth daily.     gabapentin (NEURONTIN) 100 MG capsule TAKE 1 CAPSULE (100 MG TOTAL) BY MOUTH AT BEDTIME AS NEEDED. 90 capsule 2   Multiple Vitamins-Minerals (50+ ADULT EYE HEALTH PO) Take by mouth.     Multiple Vitamins-Minerals ([employer] SPECTRAVITE ADULT 50+) TABS Take 1 tablet by mouth daily.     naproxen (NAPROSYN) 500 MG tablet TAKE 1 TABLET BY MOUTH EVERY DAY WITH FOOD 30 tablet 6   PARoxetine (PAXIL) 10 MG tablet TAKE 1 TABLET BY MOUTH EVERY DAY 90 tablet 2   potassium chloride SA (K-DUR,KLOR-CON) 20 MEQ tablet Take 1 tablet (20 mEq  total) by mouth once. (Patient taking differently: Take 20 mEq by mouth.) 30 tablet 3   Probiotic Product (DIGESTIVE ADVANTAGE  PO) Take 1 each by mouth every other day.     SYNTHROID 125 MCG tablet TAKE 1 TABLET BY MOUTH EVERY DAY 90 tablet 2   Syringe/Needle, Disp, (SYRINGE 3CC/22GX1") 22G X 1" 3 ML MISC 1 Syringe by Does not apply route once. Please dispense syringe/needle for b12 injections. Pt will self administer her own injections. 50 each 4   albuterol (VENTOLIN HFA) 108 (90 Base) MCG/ACT inhaler Inhale 2 puffs into the lungs every 6 (six) hours as needed for wheezing or shortness of breath. 8 g 3   clarithromycin (BIAXIN) 500 MG tablet Take 1 tablet (500 mg total) by mouth 2 (two) times daily. 20 tablet 0   diltiazem (CARDIZEM CD) 120 MG 24 hr capsule TAKE ONE CAPSULE BY MOUTH EVERY DAY     Multiple Vitamins-Minerals (ICAPS AREDS FORMULA PO) Take 1 tablet by mouth daily.     No facility-administered medications prior to visit.    Allergies  Allergen Reactions   Latex Rash    Pt denies.  Reports allergy to adhesives, not latex   Codeine Nausea And Vomiting   Contrast Media [Iodinated Diagnostic Agents] Nausea And Vomiting and Other (See Comments)    "CONVULSIONS"   Indomethacin Nausea And Vomiting and Other (See Comments)    "CONVULSIONS"   Adhesive [Tape] Rash and Other (See Comments)    BURNS SKIN    ROS Review of Systems  Constitutional:  Negative for activity change and appetite change.  HENT:  Negative for congestion.   Eyes:  Negative for visual disturbance.  Respiratory:  Negative for chest tightness and shortness of breath.   Gastrointestinal:  Positive for abdominal pain. Negative for abdominal distention.  Endocrine: Negative for polyuria.  Genitourinary:  Negative for difficulty urinating and dysuria.  Musculoskeletal:  Negative for arthralgias and back pain.  Neurological: Negative.   Psychiatric/Behavioral: Negative.       Objective:    Physical  Exam Vitals reviewed.  Constitutional:      Appearance: Normal appearance.  HENT:     Right Ear: Tympanic membrane normal.     Left Ear: Tympanic membrane normal.  Cardiovascular:     Rate and Rhythm: Normal rate and regular rhythm.     Pulses: Normal pulses.     Heart sounds: Normal heart sounds. No murmur heard.   No gallop.  Pulmonary:     Effort: Pulmonary effort is normal. No respiratory distress.     Breath sounds: Normal breath sounds. No rales.  Abdominal:     General: Abdomen is flat. Bowel sounds are normal. There is no distension.     Tenderness: There is no abdominal tenderness.  Musculoskeletal:        General: Swelling present.     Right lower leg: No edema.     Left lower leg: Edema present.  Skin:    General: Skin is warm and dry.     Capillary Refill: Capillary refill takes less than 2 seconds.     Coloration: Skin is not jaundiced.  Neurological:     General: No focal deficit present.     Mental Status: She is alert and oriented to person, place, and time.    BP 130/60   Pulse 75   Temp 98.1 F (36.7 C)   Resp 16   Ht 5' 5"  (1.651 m)   Wt 171 lb (77.6 kg)   BMI 28.46 kg/m  Wt Readings from Last 3 Encounters:  06/03/21 171 lb (77.6 kg)  05/28/21 168 lb (76.2  kg)  11/05/20 158 lb 12.8 oz (72 kg)     Health Maintenance Due  Topic Date Due   DEXA SCAN  Never done   TETANUS/TDAP  Never done   Zoster Vaccines- Shingrix (1 of 2) Never done   MAMMOGRAM  01/15/2019   PNA vac Low Risk Adult (2 of 2 - PPSV23) 11/23/2019   COLONOSCOPY (Pts 45-61yr Insurance coverage will need to be confirmed)  04/17/2021    There are no preventive care reminders to display for this patient.  Lab Results  Component Value Date   TSH 4.930 (H) 05/28/2021   Lab Results  Component Value Date   WBC 5.9 05/28/2021   HGB 12.8 05/28/2021   HCT 38.6 05/28/2021   MCV 92 05/28/2021   PLT 211 05/28/2021   Lab Results  Component Value Date   NA 140 05/28/2021   K 4.5  05/28/2021   CO2 19 (L) 05/28/2021   GLUCOSE 86 05/28/2021   BUN 25 05/28/2021   CREATININE 0.93 05/28/2021   BILITOT 0.4 05/28/2021   ALKPHOS 98 05/28/2021   AST 16 05/28/2021   ALT 14 05/28/2021   PROT 6.6 05/28/2021   ALBUMIN 4.1 05/28/2021   CALCIUM 9.5 05/28/2021   ANIONGAP 10 01/03/2019   EGFR 65 05/28/2021   Lab Results  Component Value Date   CHOL 156 05/28/2021   Lab Results  Component Value Date   HDL 51 05/28/2021   Lab Results  Component Value Date   LDLCALC 92 05/28/2021   Lab Results  Component Value Date   TRIG 67 05/28/2021   Lab Results  Component Value Date   CHOLHDL 3.1 05/28/2021   No results found for: HGBA1C    Assessment & Plan:   Problem List Items Addressed This Visit       Cardiovascular and Mediastinum   Venous incompetence Patient has chronic venous disease and we will use unna boot fot help with edema.     Other   Cellulitis and abscess of left leg - Primary Finished keflex, watch after wrapping      Follow-up: Return in about 1 week (around 06/10/2021) for leg edema.    LReinaldo Meeker MD

## 2021-06-05 ENCOUNTER — Ambulatory Visit (INDEPENDENT_AMBULATORY_CARE_PROVIDER_SITE_OTHER): Payer: Medicare Other | Admitting: Legal Medicine

## 2021-06-05 ENCOUNTER — Other Ambulatory Visit: Payer: Self-pay

## 2021-06-05 ENCOUNTER — Encounter: Payer: Self-pay | Admitting: Legal Medicine

## 2021-06-05 VITALS — BP 140/80 | HR 80 | Temp 98.1°F | Resp 16 | Ht 65.0 in | Wt 169.0 lb

## 2021-06-05 DIAGNOSIS — L02416 Cutaneous abscess of left lower limb: Secondary | ICD-10-CM

## 2021-06-05 DIAGNOSIS — L03116 Cellulitis of left lower limb: Secondary | ICD-10-CM

## 2021-06-05 DIAGNOSIS — I872 Venous insufficiency (chronic) (peripheral): Secondary | ICD-10-CM | POA: Diagnosis not present

## 2021-06-05 MED ORDER — CLINDAMYCIN HCL 300 MG PO CAPS: 300.0000 mg | ORAL_CAPSULE | Freq: Three times a day (TID) | ORAL | 0 refills | Status: DC

## 2021-06-05 NOTE — Progress Notes (Signed)
Established Patient Office Visit  Subjective:  Patient ID: Sydney Ayala, female    DOB: 06-17-1946  Age: 75 y.o. MRN: [id number]  CC:  Chief Complaint  Patient presents with   Leg Swelling    HPI Martia Dalby Schwarting presents for left leg swelling and redness.  Past Medical History:  Diagnosis Date   Chronic venous insufficiency    Dysrhythmia    A Fib during hospital stay 04/03/16   History of rheumatic fever CHILDHOOD   Hypothyroidism    Injury of tendon of long head of right biceps 2018   Pinched nerve in neck     Past Surgical History:  Procedure Laterality Date   ABDOMINAL HYSTERECTOMY     CARDIOVASCULAR STRESS TEST  03/15/2008   NORMAL LVSF/ EF 61%/ NO ISCHEMIA   CATARACT EXTRACTION W/ INTRAOCULAR LENS IMPLANT & ANTERIOR VITRECTOMY, BILATERAL Bilateral 02/2021   COLONOSCOPY WITH PROPOFOL N/A 04/17/2016   Procedure: COLONOSCOPY WITH PROPOFOL;  Surgeon: Lucilla Lame, MD;  Location: [place];  Service: Endoscopy;  Laterality: N/A;   DILATION AND CURETTAGE OF UTERUS     ESOPHAGOGASTRODUODENOSCOPY (EGD) WITH PROPOFOL N/A 04/04/2016   Procedure: ESOPHAGOGASTRODUODENOSCOPY (EGD) WITH PROPOFOL;  Surgeon: Lucilla Lame, MD;  Location: [facility];  Service: Endoscopy;  Laterality: N/A;   GANGLION CYST EXCISION  06/09/2012   Procedure: REMOVAL GANGLION OF WRIST;  Surgeon: Magnus Sinning, MD;  Location: [place];  Service: Orthopedics;  Laterality: Left;  EXCISION OF GANGLION CYST LEFT RING FINGER  IV REGIONAL ANESTHESIA   HEMRROIDECTOMY AND ANAL FISSURE REPAIR  12/16/1963   KNEE ARTHROSCOPY WITH MEDIAL MENISECTOMY Left 11/26/2017   Procedure: KNEE ARTHROSCOPY WITH MEDIAL MENISECTOMY;  Surgeon: Corky Mull, MD;  Location: [facility];  Service: Orthopedics;  Laterality: Left;   LAPAROSCOPIC CHOLECYSTECTOMY  05/13/2002   Gall Bladder   LEFT BREAST DUCTAL EXCISION  10/11/2009   BENIGN   LEFT HAND SURG.  08/16/1979   EXTENSIVE REPAIR AND REMOVAL OF  LIGAMENT/TENDON  (?CANCER)   LUMBAR LAMINECTOMY  1990  &  1976   RIGHT SIDE L4 - 5  & L5 - S1   POLYPECTOMY  04/17/2016   Procedure: POLYPECTOMY;  Surgeon: Lucilla Lame, MD;  Location: [place];  Service: Endoscopy;;   REMOVAL RIGHT GOIN MASS  08/16/1979   BENIGN   REMOVAL TOE NAILBED  AGE 60   ALL 10 TOES-- DEFORMED   RIGHT BREAST LUMPECTOMY     BENIGN   RIGHT FOOT SURG  X3   REPAIR OF 2 TOES   ROTATOR CUFF REPAIR  04/11/2008   LEFT SHOULDER   SPINE SURGERY     THYROID GOITER REMOVED  08/15/1969   TONSILLECTOMY  CHILD   VAGINAL HYSTERECTOMY/ ANTERIOR & POSTERIOR REPAIR/ TRANSOBTURATOR SLING  04/25/2005   CYSTOCELE/ RECTOCELE/ UTERINE PROLAPSE/ SUI    Family History  Problem Relation Age of Onset   Deep vein thrombosis Mother        Right Leg   Heart disease Mother        Before age 101-  CHF   Varicose Veins Mother    Heart attack Mother    Heart disease Father        Before age 81-  PVD   Heart attack Father    Arthritis Father        Gout   Hypertension Father    Cancer Sister        Thyroid-Back   Cancer Sister  Breast and Lung   Heart disease Sister    Heart disease Sister    Other Sister        platelet disorder   Colon cancer Maternal Grandmother    Breast cancer Maternal Grandmother    Heart attack Other        father's side multiple family members   Stroke Other        father's side multiple family members   Colon cancer Maternal Uncle    Diabetes Maternal Uncle    Diabetes Maternal Aunt     Social History   Socioeconomic History   Marital status: Divorced    Spouse name: Not on file   Number of children: 3   Years of education: Not on file   Highest education level: 8th grade  Occupational History   Not on file  Tobacco Use   Smoking status: Former    Years: 40.00    Pack years: 0.00    Types: Cigarettes    Quit date: 1993    Years since quitting: 29.4   Smokeless tobacco: Never  Vaping Use   Vaping Use: Never used   Substance and Sexual Activity   Alcohol use: Never   Drug use: Never   Sexual activity: Not on file  Other Topics Concern   Not on file  Social History Narrative   Lives alone   Retired   Caffeine: coffee 3 cups daily   Social Determinants of Health   Financial Resource Strain: Not on file  Food Insecurity: Not on file  Transportation Needs: Not on file  Physical Activity: Not on file  Stress: Not on file  Social Connections: Not on file  Intimate Partner Violence: Not on file    Outpatient Medications Prior to Visit  Medication Sig Dispense Refill   acetaminophen (TYLENOL) 500 MG tablet Take 1,000 mg by mouth every 8 (eight) hours as needed for mild pain.     BD SYRINGE SLIP TIP 25G X 5/8" 1 ML MISC USE AS DIRECTED - ONCE A MONTH 4 each 3   clotrimazole-betamethasone (LOTRISONE) cream Apply 1 application topically 2 (two) times daily. 30 g 2   cyanocobalamin (,VITAMIN B-12,) 1000 MCG/ML injection INJECT 1 ML MONTHLY 3 mL 3   diltiazem (TIAZAC) 120 MG 24 hr capsule Take 1 capsule by mouth daily.     gabapentin (NEURONTIN) 100 MG capsule TAKE 1 CAPSULE (100 MG TOTAL) BY MOUTH AT BEDTIME AS NEEDED. 90 capsule 2   Multiple Vitamins-Minerals (50+ ADULT EYE HEALTH PO) Take by mouth.     Multiple Vitamins-Minerals ([employer] SPECTRAVITE ADULT 50+) TABS Take 1 tablet by mouth daily.     naproxen (NAPROSYN) 500 MG tablet TAKE 1 TABLET BY MOUTH EVERY DAY WITH FOOD 30 tablet 6   PARoxetine (PAXIL) 10 MG tablet TAKE 1 TABLET BY MOUTH EVERY DAY 90 tablet 2   potassium chloride SA (K-DUR,KLOR-CON) 20 MEQ tablet Take 1 tablet (20 mEq total) by mouth once. (Patient taking differently: Take 20 mEq by mouth.) 30 tablet 3   Probiotic Product (DIGESTIVE ADVANTAGE PO) Take 1 each by mouth every other day.     SYNTHROID 125 MCG tablet TAKE 1 TABLET BY MOUTH EVERY DAY 90 tablet 2   Syringe/Needle, Disp, (SYRINGE 3CC/22GX1") 22G X 1" 3 ML MISC 1 Syringe by Does not apply route once. Please dispense  syringe/needle for b12 injections. Pt will self administer her own injections. 50 each 4   cephALEXin (KEFLEX) 500 MG capsule Take 1 capsule (  500 mg total) by mouth 4 (four) times daily. 40 capsule 0   No facility-administered medications prior to visit.    Allergies  Allergen Reactions   Latex Rash    Pt denies.  Reports allergy to adhesives, not latex   Codeine Nausea And Vomiting   Contrast Media [Iodinated Diagnostic Agents] Nausea And Vomiting and Other (See Comments)    "CONVULSIONS"   Indomethacin Nausea And Vomiting and Other (See Comments)    "CONVULSIONS"   Adhesive [Tape] Rash and Other (See Comments)    BURNS SKIN    ROS Review of Systems  Constitutional:  Negative for activity change and appetite change.  HENT:  Negative for congestion.   Eyes:  Negative for visual disturbance.  Respiratory:  Negative for chest tightness and shortness of breath.   Cardiovascular:  Negative for chest pain, palpitations and leg swelling.  Gastrointestinal:  Negative for abdominal distention and abdominal pain.  Endocrine: Negative for polyuria.  Genitourinary:  Negative for difficulty urinating and dysuria.  Musculoskeletal:  Negative for arthralgias and back pain.       Left leg pain and redness  Skin:  Positive for rash.  Neurological: Negative.   Psychiatric/Behavioral: Negative.       Objective:    Physical Exam Vitals reviewed.  Constitutional:      Appearance: Normal appearance.  HENT:     Right Ear: Tympanic membrane normal.     Left Ear: Tympanic membrane normal.     Mouth/Throat:     Mouth: Mucous membranes are moist.     Pharynx: Oropharynx is clear.  Eyes:     Extraocular Movements: Extraocular movements intact.     Conjunctiva/sclera: Conjunctivae normal.     Pupils: Pupils are equal, round, and reactive to light.  Cardiovascular:     Rate and Rhythm: Normal rate and regular rhythm.     Pulses: Normal pulses.     Heart sounds: Normal heart sounds. No  murmur heard.   No gallop.  Abdominal:     General: Abdomen is flat. Bowel sounds are normal. There is no distension.     Palpations: Abdomen is soft.     Tenderness: There is no abdominal tenderness.  Musculoskeletal:        General: Normal range of motion.     Cervical back: Normal range of motion and neck supple.  Skin:    General: Skin is warm.  Neurological:     General: No focal deficit present.     Mental Status: She is alert and oriented to person, place, and time. Mental status is at baseline.  Psychiatric:        Mood and Affect: Mood normal.    BP 140/80   Pulse 80   Temp 98.1 F (36.7 C)   Resp 16   Ht _0  (1.651 m)   Wt 169 lb (76.7 kg)   SpO2 90%   BMI 28.12 kg/m  Wt Readings from Last 3 Encounters:  06/05/21 169 lb (76.7 kg)  06/03/21 171 lb (77.6 kg)  05/28/21 168 lb (76.2 kg)     Health Maintenance Due  Topic Date Due   DEXA SCAN  Never done   TETANUS/TDAP  Never done   Zoster Vaccines- Shingrix (1 of 2) Never done   MAMMOGRAM  01/15/2019   PNA vac Low Risk Adult (2 of 2 - PPSV23) 11/23/2019   COLONOSCOPY (Pts 45-80yr Insurance coverage will need to be confirmed)  04/17/2021    There are no preventive  care reminders to display for this patient.  Lab Results  Component Value Date   TSH 4.930 (H) 05/28/2021   Lab Results  Component Value Date   WBC 5.9 05/28/2021   HGB 12.8 05/28/2021   HCT 38.6 05/28/2021   MCV 92 05/28/2021   PLT 211 05/28/2021   Lab Results  Component Value Date   NA 140 05/28/2021   K 4.5 05/28/2021   CO2 19 (L) 05/28/2021   GLUCOSE 86 05/28/2021   BUN 25 05/28/2021   CREATININE 0.93 05/28/2021   BILITOT 0.4 05/28/2021   ALKPHOS 98 05/28/2021   AST 16 05/28/2021   ALT 14 05/28/2021   PROT 6.6 05/28/2021   ALBUMIN 4.1 05/28/2021   CALCIUM 9.5 05/28/2021   ANIONGAP 10 01/03/2019   EGFR 65 05/28/2021   Lab Results  Component Value Date   CHOL 156 05/28/2021   Lab Results  Component Value Date   HDL  51 05/28/2021   Lab Results  Component Value Date   LDLCALC 92 05/28/2021   Lab Results  Component Value Date   TRIG 67 05/28/2021   Lab Results  Component Value Date   CHOLHDL 3.1 05/28/2021   No results found for: HGBA1C    Assessment & Plan:   Diagnoses and all orders for this visit: Cellulitis and abscess of left leg -     Ambulatory referral to Vascular Surgery -     clindamycin (CLEOCIN) 300 MG capsule; Take 1 capsule (300 mg total) by mouth 3 (three) times daily. Continued cellulitis in leg, unna boot no help, use clindamycin and get vascular consult  Venous incompetence  Chronic venous incontinence     Follow-up: Return in about 2 weeks (around 06/19/2021) for cellulitis left leg.    Reinaldo Meeker, MD

## 2021-06-07 ENCOUNTER — Ambulatory Visit: Payer: Medicare Other | Admitting: Legal Medicine

## 2021-06-12 ENCOUNTER — Other Ambulatory Visit: Payer: Self-pay | Admitting: Cardiology

## 2021-06-12 ENCOUNTER — Ambulatory Visit
Admission: RE | Admit: 2021-06-12 | Discharge: 2021-06-12 | Disposition: A | Discharge status: Home / Self Care | Payer: Medicare Other | Source: Ambulatory Visit | Attending: Cardiology | Admitting: Cardiology

## 2021-06-12 ENCOUNTER — Other Ambulatory Visit: Payer: Self-pay

## 2021-06-12 DIAGNOSIS — R1030 Lower abdominal pain, unspecified: Secondary | ICD-10-CM | POA: Diagnosis not present

## 2021-06-12 DIAGNOSIS — M47816 Spondylosis without myelopathy or radiculopathy, lumbar region: Secondary | ICD-10-CM | POA: Diagnosis not present

## 2021-06-12 DIAGNOSIS — R1031 Right lower quadrant pain: Secondary | ICD-10-CM | POA: Insufficient documentation

## 2021-06-12 DIAGNOSIS — K5750 Diverticulosis of both small and large intestine without perforation or abscess without bleeding: Secondary | ICD-10-CM | POA: Diagnosis not present

## 2021-06-12 DIAGNOSIS — I10 Essential (primary) hypertension: Secondary | ICD-10-CM | POA: Diagnosis not present

## 2021-06-12 DIAGNOSIS — I824Y9 Acute embolism and thrombosis of unspecified deep veins of unspecified proximal lower extremity: Secondary | ICD-10-CM | POA: Diagnosis not present

## 2021-06-12 DIAGNOSIS — R1032 Left lower quadrant pain: Secondary | ICD-10-CM | POA: Insufficient documentation

## 2021-06-12 DIAGNOSIS — M7989 Other specified soft tissue disorders: Secondary | ICD-10-CM | POA: Diagnosis not present

## 2021-06-12 DIAGNOSIS — I480 Paroxysmal atrial fibrillation: Secondary | ICD-10-CM | POA: Diagnosis not present

## 2021-06-12 DIAGNOSIS — K449 Diaphragmatic hernia without obstruction or gangrene: Secondary | ICD-10-CM | POA: Diagnosis not present

## 2021-06-12 DIAGNOSIS — I340 Nonrheumatic mitral (valve) insufficiency: Secondary | ICD-10-CM | POA: Diagnosis not present

## 2021-06-20 DIAGNOSIS — H353132 Nonexudative age-related macular degeneration, bilateral, intermediate dry stage: Secondary | ICD-10-CM | POA: Diagnosis not present

## 2021-06-20 DIAGNOSIS — H35012 Changes in retinal vascular appearance, left eye: Secondary | ICD-10-CM | POA: Diagnosis not present

## 2021-06-27 ENCOUNTER — Other Ambulatory Visit: Payer: Self-pay

## 2021-06-27 ENCOUNTER — Ambulatory Visit (INDEPENDENT_AMBULATORY_CARE_PROVIDER_SITE_OTHER): Payer: Medicare Other | Admitting: Nurse Practitioner

## 2021-06-27 VITALS — BP 169/73 | HR 66 | Resp 15 | Ht 66.0 in | Wt 169.8 lb

## 2021-06-27 DIAGNOSIS — G609 Hereditary and idiopathic neuropathy, unspecified: Secondary | ICD-10-CM

## 2021-06-27 DIAGNOSIS — I10 Essential (primary) hypertension: Secondary | ICD-10-CM | POA: Diagnosis not present

## 2021-06-27 DIAGNOSIS — R600 Localized edema: Secondary | ICD-10-CM

## 2021-06-27 DIAGNOSIS — M7989 Other specified soft tissue disorders: Secondary | ICD-10-CM

## 2021-07-01 ENCOUNTER — Ambulatory Visit (INDEPENDENT_AMBULATORY_CARE_PROVIDER_SITE_OTHER): Payer: Medicare Other | Admitting: Legal Medicine

## 2021-07-01 ENCOUNTER — Encounter: Payer: Self-pay | Admitting: Legal Medicine

## 2021-07-01 ENCOUNTER — Other Ambulatory Visit: Payer: Self-pay

## 2021-07-01 VITALS — BP 130/90 | HR 74 | Temp 97.2°F | Resp 16 | Ht 66.0 in | Wt 169.0 lb

## 2021-07-01 DIAGNOSIS — I83899 Varicose veins of unspecified lower extremities with other complications: Secondary | ICD-10-CM

## 2021-07-01 NOTE — Progress Notes (Signed)
Established Patient Office Visit  Subjective:  Patient ID: Sydney Ayala, female    DOB: 1946/07/06  Age: 75 y.o. MRN: [id number]  CC:  Chief Complaint  Patient presents with   Foot Swelling    Right foot swelling since 4 days ago. She mentioned that her toes are numb with radiation to the foot.    HPI Sydney Ayala presents for Leg ulcers.She is going to [place].  The left leg is wrapped with unna boot.  Unwrapped and it is healed well.  The right leg is red and swollen.no drainage.  Past Medical History:  Diagnosis Date   Chronic venous insufficiency    Dysrhythmia    A Fib during hospital stay 04/03/16   History of rheumatic fever CHILDHOOD   Hypothyroidism    Injury of tendon of long head of right biceps 2018   Pinched nerve in neck     Past Surgical History:  Procedure Laterality Date   ABDOMINAL HYSTERECTOMY     CARDIOVASCULAR STRESS TEST  03/15/2008   NORMAL LVSF/ EF 61%/ NO ISCHEMIA   CATARACT EXTRACTION W/ INTRAOCULAR LENS IMPLANT & ANTERIOR VITRECTOMY, BILATERAL Bilateral 02/2021   COLONOSCOPY WITH PROPOFOL N/A 04/17/2016   Procedure: COLONOSCOPY WITH PROPOFOL;  Surgeon: Lucilla Lame, MD;  Location: [facility];  Service: Endoscopy;  Laterality: N/A;   DILATION AND CURETTAGE OF UTERUS     ESOPHAGOGASTRODUODENOSCOPY (EGD) WITH PROPOFOL N/A 04/04/2016   Procedure: ESOPHAGOGASTRODUODENOSCOPY (EGD) WITH PROPOFOL;  Surgeon: Lucilla Lame, MD;  Location: [facility];  Service: Endoscopy;  Laterality: N/A;   GANGLION CYST EXCISION  06/09/2012   Procedure: REMOVAL GANGLION OF WRIST;  Surgeon: Magnus Sinning, MD;  Location: [facility];  Service: Orthopedics;  Laterality: Left;  EXCISION OF GANGLION CYST LEFT RING FINGER  IV REGIONAL ANESTHESIA   HEMRROIDECTOMY AND ANAL FISSURE REPAIR  12/16/1963   KNEE ARTHROSCOPY WITH MEDIAL MENISECTOMY Left 11/26/2017   Procedure: KNEE ARTHROSCOPY WITH MEDIAL MENISECTOMY;  Surgeon: Corky Mull, MD;  Location:  [facility];  Service: Orthopedics;  Laterality: Left;   LAPAROSCOPIC CHOLECYSTECTOMY  05/13/2002   Gall Bladder   LEFT BREAST DUCTAL EXCISION  10/11/2009   BENIGN   LEFT HAND SURG.  08/16/1979   EXTENSIVE REPAIR AND REMOVAL OF LIGAMENT/TENDON  (?CANCER)   LUMBAR LAMINECTOMY  1990  &  1976   RIGHT SIDE L4 - 5  & L5 - S1   POLYPECTOMY  04/17/2016   Procedure: POLYPECTOMY;  Surgeon: Lucilla Lame, MD;  Location: [facility];  Service: Endoscopy;;   REMOVAL RIGHT GOIN MASS  08/16/1979   BENIGN   REMOVAL TOE NAILBED  AGE 4   ALL 10 TOES-- DEFORMED   RIGHT BREAST LUMPECTOMY     BENIGN   RIGHT FOOT SURG  X3   REPAIR OF 2 TOES   ROTATOR CUFF REPAIR  04/11/2008   LEFT SHOULDER   SPINE SURGERY     THYROID GOITER REMOVED  08/15/1969   TONSILLECTOMY  CHILD   VAGINAL HYSTERECTOMY/ ANTERIOR & POSTERIOR REPAIR/ TRANSOBTURATOR SLING  04/25/2005   CYSTOCELE/ RECTOCELE/ UTERINE PROLAPSE/ SUI    Family History  Problem Relation Age of Onset   Deep vein thrombosis Mother        Right Leg   Heart disease Mother        Before age 30-  CHF   Varicose Veins Mother    Heart attack Mother    Heart disease Father  Before age 15-  PVD   Heart attack Father    Arthritis Father        Gout   Hypertension Father    Cancer Sister        Thyroid-Back   Cancer Sister        Breast and Lung   Heart disease Sister    Heart disease Sister    Other Sister        platelet disorder   Colon cancer Maternal Grandmother    Breast cancer Maternal Grandmother    Heart attack Other        father's side multiple family members   Stroke Other        father's side multiple family members   Colon cancer Maternal Uncle    Diabetes Maternal Uncle    Diabetes Maternal Aunt     Social History   Socioeconomic History   Marital status: Divorced    Spouse name: Not on file   Number of children: 3   Years of education: Not on file   Highest education level: 8th grade  Occupational History    Not on file  Tobacco Use   Smoking status: Former    Years: 40.00    Types: Cigarettes    Quit date: 1993    Years since quitting: 29.5   Smokeless tobacco: Never  Vaping Use   Vaping Use: Never used  Substance and Sexual Activity   Alcohol use: Never   Drug use: Never   Sexual activity: Not on file  Other Topics Concern   Not on file  Social History Narrative   Lives alone   Retired   Caffeine: coffee 3 cups daily   Social Determinants of Health   Financial Resource Strain: Not on file  Food Insecurity: Not on file  Transportation Needs: Not on file  Physical Activity: Not on file  Stress: Not on file  Social Connections: Not on file  Intimate Partner Violence: Not on file    Outpatient Medications Prior to Visit  Medication Sig Dispense Refill   acetaminophen (TYLENOL) 500 MG tablet Take 1,000 mg by mouth every 8 (eight) hours as needed for mild pain.     BD SYRINGE SLIP TIP 25G X 5/8" 1 ML MISC USE AS DIRECTED - ONCE A MONTH 4 each 3   clotrimazole-betamethasone (LOTRISONE) cream Apply 1 application topically 2 (two) times daily. 30 g 2   cyanocobalamin (,VITAMIN B-12,) 1000 MCG/ML injection INJECT 1 ML MONTHLY 3 mL 3   diltiazem (TIAZAC) 120 MG 24 hr capsule Take 1 capsule by mouth daily.     gabapentin (NEURONTIN) 100 MG capsule TAKE 1 CAPSULE (100 MG TOTAL) BY MOUTH AT BEDTIME AS NEEDED. 90 capsule 2   Multiple Vitamins-Minerals (50+ ADULT EYE HEALTH PO) Take by mouth.     Multiple Vitamins-Minerals ([employer] SPECTRAVITE ADULT 50+) TABS Take 1 tablet by mouth daily.     naproxen (NAPROSYN) 500 MG tablet TAKE 1 TABLET BY MOUTH EVERY DAY WITH FOOD 30 tablet 6   PARoxetine (PAXIL) 10 MG tablet TAKE 1 TABLET BY MOUTH EVERY DAY 90 tablet 2   potassium chloride SA (K-DUR,KLOR-CON) 20 MEQ tablet Take 1 tablet (20 mEq total) by mouth once. (Patient taking differently: Take 20 mEq by mouth.) 30 tablet 3   Probiotic Product (DIGESTIVE ADVANTAGE PO) Take 1 each by mouth every other  day.     SYNTHROID 125 MCG tablet TAKE 1 TABLET BY MOUTH EVERY DAY 90 tablet 2  Syringe/Needle, Disp, (SYRINGE 3CC/22GX1") 22G X 1" 3 ML MISC 1 Syringe by Does not apply route once. Please dispense syringe/needle for b12 injections. Pt will self administer her own injections. 50 each 4   clindamycin (CLEOCIN) 300 MG capsule Take 1 capsule (300 mg total) by mouth 3 (three) times daily. 30 capsule 0   No facility-administered medications prior to visit.    Allergies  Allergen Reactions   Latex Rash    Pt denies.  Reports allergy to adhesives, not latex   Codeine Nausea And Vomiting   Contrast Media [Iodinated Diagnostic Agents] Nausea And Vomiting and Other (See Comments)    "CONVULSIONS"   Indomethacin Nausea And Vomiting and Other (See Comments)    "CONVULSIONS"   Adhesive [Tape] Rash and Other (See Comments)    BURNS SKIN    ROS Review of Systems  Constitutional:  Negative for activity change and appetite change.  HENT:  Negative for congestion.   Eyes:  Negative for visual disturbance.  Respiratory:  Negative for chest tightness and shortness of breath.   Cardiovascular:  Negative for chest pain and palpitations.  Gastrointestinal:  Negative for abdominal distention and abdominal pain.  Endocrine: Negative for polyuria.  Genitourinary:  Negative for difficulty urinating and dysuria.  Musculoskeletal:  Negative for arthralgias and back pain.  Neurological: Negative.   Psychiatric/Behavioral: Negative.       Objective:    Physical Exam Vitals reviewed.  Constitutional:      Appearance: Normal appearance.  Cardiovascular:     Rate and Rhythm: Normal rate and regular rhythm.     Pulses: Normal pulses.     Heart sounds: Normal heart sounds. No murmur heard.   No gallop.  Pulmonary:     Effort: Pulmonary effort is normal.     Breath sounds: Normal breath sounds.  Musculoskeletal:     Cervical back: Normal range of motion.  Skin:    Capillary Refill: Capillary refill  takes less than 2 seconds.     Findings: Rash present.     Comments: Redness skin in both legs, the left leg swelling has resolved and ulcer healed.  The right leg is red and has excoriations  Neurological:     Mental Status: She is alert.    BP 130/90   Pulse 74   Temp (!) 97.2 F (36.2 C)   Resp 16   Ht 5' 6"  (1.676 m)   Wt 169 lb (76.7 kg)   SpO2 96%   BMI 27.28 kg/m  Wt Readings from Last 3 Encounters:  07/01/21 169 lb (76.7 kg)  06/27/21 169 lb 12.8 oz (77 kg)  06/05/21 169 lb (76.7 kg)     Health Maintenance Due  Topic Date Due   DEXA SCAN  Never done   TETANUS/TDAP  Never done   Zoster Vaccines- Shingrix (1 of 2) Never done   MAMMOGRAM  01/15/2019   PNA vac Low Risk Adult (2 of 2 - PPSV23) 11/23/2019   COLONOSCOPY (Pts 45-34yr Insurance coverage will need to be confirmed)  04/17/2021    There are no preventive care reminders to display for this patient.  Lab Results  Component Value Date   TSH 4.930 (H) 05/28/2021   Lab Results  Component Value Date   WBC 5.9 05/28/2021   HGB 12.8 05/28/2021   HCT 38.6 05/28/2021   MCV 92 05/28/2021   PLT 211 05/28/2021   Lab Results  Component Value Date   NA 140 05/28/2021   K 4.5 05/28/2021  CO2 19 (L) 05/28/2021   GLUCOSE 86 05/28/2021   BUN 25 05/28/2021   CREATININE 0.93 05/28/2021   BILITOT 0.4 05/28/2021   ALKPHOS 98 05/28/2021   AST 16 05/28/2021   ALT 14 05/28/2021   PROT 6.6 05/28/2021   ALBUMIN 4.1 05/28/2021   CALCIUM 9.5 05/28/2021   ANIONGAP 10 01/03/2019   EGFR 65 05/28/2021   Lab Results  Component Value Date   CHOL 156 05/28/2021   Lab Results  Component Value Date   HDL 51 05/28/2021   Lab Results  Component Value Date   LDLCALC 92 05/28/2021   Lab Results  Component Value Date   TRIG 67 05/28/2021   Lab Results  Component Value Date   CHOLHDL 3.1 05/28/2021   No results found for: HGBA1C    Assessment & Plan:   Diagnoses and all orders for this visit: Varicose  veins of leg with complications  Redress both legs with unna boots    Follow-up: Return if symptoms worsen or fail to improve.    Reinaldo Meeker, MD

## 2021-07-04 ENCOUNTER — Ambulatory Visit (INDEPENDENT_AMBULATORY_CARE_PROVIDER_SITE_OTHER): Payer: Medicare Other | Admitting: Nurse Practitioner

## 2021-07-04 ENCOUNTER — Other Ambulatory Visit: Payer: Self-pay

## 2021-07-04 ENCOUNTER — Encounter (INDEPENDENT_AMBULATORY_CARE_PROVIDER_SITE_OTHER): Payer: Self-pay | Admitting: Nurse Practitioner

## 2021-07-04 VITALS — BP 174/80 | HR 65 | Ht 66.0 in | Wt 166.0 lb

## 2021-07-04 DIAGNOSIS — L02416 Cutaneous abscess of left lower limb: Secondary | ICD-10-CM | POA: Diagnosis not present

## 2021-07-04 DIAGNOSIS — L03116 Cellulitis of left lower limb: Secondary | ICD-10-CM

## 2021-07-04 NOTE — Progress Notes (Signed)
History of Present Illness  There is no documented history at this time  Assessments & Plan   There are no diagnoses linked to this encounter.    Additional instructions  Subjective:  Patient presents with venous ulcer of the Bilateral lower extremity.    Procedure:  3 layer unna wrap was placed Bilateral lower extremity.   Plan:   Follow up in one week.  

## 2021-07-07 ENCOUNTER — Encounter (INDEPENDENT_AMBULATORY_CARE_PROVIDER_SITE_OTHER): Payer: Self-pay | Admitting: Nurse Practitioner

## 2021-07-07 NOTE — Progress Notes (Signed)
Subjective:    Patient ID: Sydney Ayala, female    DOB: February 25, 1946, 75 y.o.   MRN: [id number] Chief Complaint  Patient presents with   New Patient (Initial Visit)    Sydney Ayala is a 75 year old female that is seen for evaluation of leg swelling. The patient first noticed the swelling remotely but is now concerned because of a significant increase in the overall edema.  It is noted that the left is worse than the right.  The swelling is associated with pain and discoloration. The patient notes that in the morning the legs are significantly improved but they steadily worsened throughout the course of the day. Elevation makes the legs better, dependency makes them much worse.   There is no history of ulcerations associated with the swelling.   The patient denies any recent changes in their medications.  The patient has not been wearing graduated compression.  The patient has no had any past angiography, interventions or vascular surgery.  The patient denies a history of DVT or PE. There is no prior history of phlebitis. There is no history of primary lymphedema.  There is no history of radiation treatment to the groin or pelvis No history of malignancies. No history of trauma or groin or pelvic surgery. No history of foreign travel or parasitic infections area      Review of Systems  Cardiovascular:  Positive for leg swelling.  Neurological:  Positive for numbness.  All other systems reviewed and are negative.     Objective:   Physical Exam Vitals reviewed.  HENT:     Head: Normocephalic.  Cardiovascular:     Rate and Rhythm: Normal rate.     Pulses: Normal pulses.  Pulmonary:     Effort: Pulmonary effort is normal.  Musculoskeletal:     Right lower leg: 1+ Edema present.     Left lower leg: 2+ Edema present.  Skin:    General: Skin is warm.  Neurological:     Mental Status: She is alert and oriented to person, place, and time.  Psychiatric:        Mood and  Affect: Mood normal.        Behavior: Behavior normal.        Thought Content: Thought content normal.        Judgment: Judgment normal.    BP (!) 169/73 (BP Location: Left Arm)   Pulse 66   Resp 15   Ht '5\' 6"'$  (1.676 m)   Wt 169 lb 12.8 oz (77 kg)   BMI 27.41 kg/m   Past Medical History:  Diagnosis Date   Chronic venous insufficiency    Dysrhythmia    A Fib during hospital stay 04/03/16   History of rheumatic fever CHILDHOOD   Hypothyroidism    Injury of tendon of long head of right biceps 2018   Pinched nerve in neck     Social History   Socioeconomic History   Marital status: Divorced    Spouse name: Not on file   Number of children: 3   Years of education: Not on file   Highest education level: 8th grade  Occupational History   Not on file  Tobacco Use   Smoking status: Former    Years: 40.00    Types: Cigarettes    Quit date: 1993    Years since quitting: 29.5   Smokeless tobacco: Never  Vaping Use   Vaping Use: Never used  Substance and Sexual Activity   Alcohol  use: Never   Drug use: Never   Sexual activity: Not on file  Other Topics Concern   Not on file  Social History Narrative   Lives alone   Retired   Caffeine: coffee 3 cups daily   Social Determinants of Health   Financial Resource Strain: Not on file  Food Insecurity: Not on file  Transportation Needs: Not on file  Physical Activity: Not on file  Stress: Not on file  Social Connections: Not on file  Intimate Partner Violence: Not on file    Past Surgical History:  Procedure Laterality Date   ABDOMINAL HYSTERECTOMY     CARDIOVASCULAR STRESS TEST  03/15/2008   NORMAL LVSF/ EF 61%/ NO ISCHEMIA   CATARACT EXTRACTION W/ INTRAOCULAR LENS IMPLANT & ANTERIOR VITRECTOMY, BILATERAL Bilateral 02/2021   COLONOSCOPY WITH PROPOFOL N/A 04/17/2016   Procedure: COLONOSCOPY WITH PROPOFOL;  Surgeon: Lucilla Lame, MD;  Location: [facility];  Service: Endoscopy;  Laterality: N/A;   DILATION AND  CURETTAGE OF UTERUS     ESOPHAGOGASTRODUODENOSCOPY (EGD) WITH PROPOFOL N/A 04/04/2016   Procedure: ESOPHAGOGASTRODUODENOSCOPY (EGD) WITH PROPOFOL;  Surgeon: Lucilla Lame, MD;  Location: [facility];  Service: Endoscopy;  Laterality: N/A;   GANGLION CYST EXCISION  06/09/2012   Procedure: REMOVAL GANGLION OF WRIST;  Surgeon: Magnus Sinning, MD;  Location: [facility];  Service: Orthopedics;  Laterality: Left;  EXCISION OF GANGLION CYST LEFT RING FINGER  IV REGIONAL ANESTHESIA   HEMRROIDECTOMY AND ANAL FISSURE REPAIR  12/16/1963   KNEE ARTHROSCOPY WITH MEDIAL MENISECTOMY Left 11/26/2017   Procedure: KNEE ARTHROSCOPY WITH MEDIAL MENISECTOMY;  Surgeon: Corky Mull, MD;  Location: [facility];  Service: Orthopedics;  Laterality: Left;   LAPAROSCOPIC CHOLECYSTECTOMY  05/13/2002   Gall Bladder   LEFT BREAST DUCTAL EXCISION  10/11/2009   BENIGN   LEFT HAND SURG.  08/16/1979   EXTENSIVE REPAIR AND REMOVAL OF LIGAMENT/TENDON  (?CANCER)   LUMBAR LAMINECTOMY  1990  &  1976   RIGHT SIDE L4 - 5  & L5 - S1   POLYPECTOMY  04/17/2016   Procedure: POLYPECTOMY;  Surgeon: Lucilla Lame, MD;  Location: [facility];  Service: Endoscopy;;   REMOVAL RIGHT GOIN MASS  08/16/1979   BENIGN   REMOVAL TOE NAILBED  AGE 20   ALL 10 TOES-- DEFORMED   RIGHT BREAST LUMPECTOMY     BENIGN   RIGHT FOOT SURG  X3   REPAIR OF 2 TOES   ROTATOR CUFF REPAIR  04/11/2008   LEFT SHOULDER   SPINE SURGERY     THYROID GOITER REMOVED  08/15/1969   TONSILLECTOMY  CHILD   VAGINAL HYSTERECTOMY/ ANTERIOR & POSTERIOR REPAIR/ TRANSOBTURATOR SLING  04/25/2005   CYSTOCELE/ RECTOCELE/ UTERINE PROLAPSE/ SUI    Family History  Problem Relation Age of Onset   Deep vein thrombosis Mother        Right Leg   Heart disease Mother        Before age 21-  CHF   Varicose Veins Mother    Heart attack Mother    Heart disease Father        Before age 18-  PVD   Heart attack Father    Arthritis Father        Gout    Hypertension Father    Cancer Sister        Thyroid-Back   Cancer Sister        Breast and Lung   Heart disease Sister    Heart  disease Sister    Other Sister        platelet disorder   Colon cancer Maternal Grandmother    Breast cancer Maternal Grandmother    Heart attack Other        father's side multiple family members   Stroke Other        father's side multiple family members   Colon cancer Maternal Uncle    Diabetes Maternal Uncle    Diabetes Maternal Aunt     Allergies  Allergen Reactions   Latex Rash    Pt denies.  Reports allergy to adhesives, not latex   Codeine Nausea And Vomiting   Contrast Media [Iodinated Diagnostic Agents] Nausea And Vomiting and Other (See Comments)    "CONVULSIONS"   Indomethacin Nausea And Vomiting and Other (See Comments)    "CONVULSIONS"   Adhesive [Tape] Rash and Other (See Comments)    BURNS SKIN    CBC Latest Ref Rng & Units 05/28/2021 11/05/2020 07/03/2020  WBC 3.4 - 10.8 x10E3/uL 5.9 6.5 7.8  Hemoglobin 11.1 - 15.9 g/dL 12.8 14.9 15.0  Hematocrit 34.0 - 46.6 % 38.6 44.0 45.3  Platelets 150 - 450 x10E3/uL 211 220 301      CMP     Component Value Date/Time   NA 140 05/28/2021 0000   K 4.5 05/28/2021 0000   CL 103 05/28/2021 0000   CO2 19 (L) 05/28/2021 0000   GLUCOSE 86 05/28/2021 0000   GLUCOSE 94 01/03/2019 0942   BUN 25 05/28/2021 0000   CREATININE 0.93 05/28/2021 0000   CALCIUM 9.5 05/28/2021 0000   PROT 6.6 05/28/2021 0000   ALBUMIN 4.1 05/28/2021 0000   AST 16 05/28/2021 0000   ALT 14 05/28/2021 0000   ALKPHOS 98 05/28/2021 0000   BILITOT 0.4 05/28/2021 0000   GFRNONAA 72 11/05/2020 1022   GFRAA 83 11/05/2020 1022     No results found.     Assessment & Plan:   1. Leg swelling No surgery or intervention at this point in time.    I have had a long discussion with the patient regarding venous insufficiency and why it  causes symptoms, specifically venous ulceration . I have discussed with the patient  the chronic skin changes that accompany venous insufficiency and the long term sequela such as infection and recurring  ulceration.  Patient will be placed in [employer] which will be changed weekly drainage permitting.  In addition, behavioral modification including several periods of elevation of the lower extremities during the day will be continued. Achieving a position with the ankles at heart level was stressed to the patient  The patient is instructed to begin routine exercise, especially walking on a daily basis  Patient should undergo duplex ultrasound of the venous system to ensure that DVT or reflux is not present.  Patient will follow up in 4 weeks to assess the progress with the wraps.  2. Essential hypertension Continue antihypertensive medications as already ordered, these medications have been reviewed and there are no changes at this time.   3. Idiopathic peripheral neuropathy The patient has known issues with her back issues previously had 2 back surgeries and was told she returned.  The CT abdomen pelvis did show some bar spondylosis with some degenerative disc disease.  This may be the cause for her known left leg numbness and pain as well as her slightly worsening swelling due to inflammation on the left lower extremity.   Current Outpatient Medications on File Prior to  Visit  Medication Sig Dispense Refill   acetaminophen (TYLENOL) 500 MG tablet Take 1,000 mg by mouth every 8 (eight) hours as needed for mild pain.     BD SYRINGE SLIP TIP 25G X 5/8" 1 ML MISC USE AS DIRECTED - ONCE A MONTH 4 each 3   clotrimazole-betamethasone (LOTRISONE) cream Apply 1 application topically 2 (two) times daily. 30 g 2   cyanocobalamin (,VITAMIN B-12,) 1000 MCG/ML injection INJECT 1 ML MONTHLY 3 mL 3   diltiazem (TIAZAC) 120 MG 24 hr capsule Take 1 capsule by mouth daily.     gabapentin (NEURONTIN) 100 MG capsule TAKE 1 CAPSULE (100 MG TOTAL) BY MOUTH AT BEDTIME AS NEEDED. 90 capsule 2    Multiple Vitamins-Minerals (50+ ADULT EYE HEALTH PO) Take by mouth.     Multiple Vitamins-Minerals ([employer] SPECTRAVITE ADULT 50+) TABS Take 1 tablet by mouth daily.     naproxen (NAPROSYN) 500 MG tablet TAKE 1 TABLET BY MOUTH EVERY DAY WITH FOOD 30 tablet 6   PARoxetine (PAXIL) 10 MG tablet TAKE 1 TABLET BY MOUTH EVERY DAY 90 tablet 2   potassium chloride SA (K-DUR,KLOR-CON) 20 MEQ tablet Take 1 tablet (20 mEq total) by mouth once. (Patient taking differently: Take 20 mEq by mouth.) 30 tablet 3   Probiotic Product (DIGESTIVE ADVANTAGE PO) Take 1 each by mouth every other day.     SYNTHROID 125 MCG tablet TAKE 1 TABLET BY MOUTH EVERY DAY 90 tablet 2   Syringe/Needle, Disp, (SYRINGE 3CC/22GX1") 22G X 1" 3 ML MISC 1 Syringe by Does not apply route once. Please dispense syringe/needle for b12 injections. Pt will self administer her own injections. 50 each 4   No current facility-administered medications on file prior to visit.    There are no Patient Instructions on file for this visit. No follow-ups on file.   Sydney Hartmann, NP

## 2021-07-11 ENCOUNTER — Encounter (INDEPENDENT_AMBULATORY_CARE_PROVIDER_SITE_OTHER): Payer: Medicare Other

## 2021-07-12 ENCOUNTER — Ambulatory Visit (INDEPENDENT_AMBULATORY_CARE_PROVIDER_SITE_OTHER): Payer: Medicare Other | Admitting: Nurse Practitioner

## 2021-07-12 ENCOUNTER — Other Ambulatory Visit: Payer: Self-pay

## 2021-07-12 VITALS — BP 162/76 | HR 69 | Ht 66.0 in | Wt 170.0 lb

## 2021-07-12 DIAGNOSIS — L02416 Cutaneous abscess of left lower limb: Secondary | ICD-10-CM

## 2021-07-12 DIAGNOSIS — L03116 Cellulitis of left lower limb: Secondary | ICD-10-CM

## 2021-07-12 NOTE — Progress Notes (Signed)
History of Present Illness  There is no documented history at this time  Assessments & Plan   There are no diagnoses linked to this encounter.    Additional instructions  Subjective:  Patient presents with venous ulcer of the Bilateral lower extremity.    Procedure:  3 layer unna wrap was placed Bilateral lower extremity.   Plan:   Follow up in one week.  

## 2021-07-18 ENCOUNTER — Ambulatory Visit (INDEPENDENT_AMBULATORY_CARE_PROVIDER_SITE_OTHER): Payer: Medicare Other | Admitting: Nurse Practitioner

## 2021-07-18 ENCOUNTER — Other Ambulatory Visit: Payer: Self-pay

## 2021-07-18 VITALS — BP 153/78 | HR 75 | Ht 65.0 in | Wt 167.0 lb

## 2021-07-18 DIAGNOSIS — I890 Lymphedema, not elsewhere classified: Secondary | ICD-10-CM | POA: Diagnosis not present

## 2021-07-18 NOTE — Progress Notes (Signed)
History of Present Illness  There is no documented history at this time  Assessments & Plan   There are no diagnoses linked to this encounter.    Additional instructions  Subjective:  Patient presents with venous ulcer of the Bilateral lower extremity.    Procedure:  3 layer unna wrap was placed Bilateral lower extremity.   Plan:   Follow up in one week.  

## 2021-07-22 ENCOUNTER — Encounter (INDEPENDENT_AMBULATORY_CARE_PROVIDER_SITE_OTHER): Payer: Self-pay | Admitting: Nurse Practitioner

## 2021-07-24 ENCOUNTER — Other Ambulatory Visit (INDEPENDENT_AMBULATORY_CARE_PROVIDER_SITE_OTHER): Payer: Self-pay | Admitting: Nurse Practitioner

## 2021-07-24 DIAGNOSIS — R200 Anesthesia of skin: Secondary | ICD-10-CM

## 2021-07-24 DIAGNOSIS — G609 Hereditary and idiopathic neuropathy, unspecified: Secondary | ICD-10-CM

## 2021-07-24 DIAGNOSIS — I890 Lymphedema, not elsewhere classified: Secondary | ICD-10-CM | POA: Insufficient documentation

## 2021-07-24 DIAGNOSIS — M7989 Other specified soft tissue disorders: Secondary | ICD-10-CM

## 2021-07-24 NOTE — Progress Notes (Signed)
MRN : [id number]  Sydney Ayala is a 75 y.o. ([birth year]) female who presents with chief complaint of leg swelling.  History of Present Illness:  The patient first noticed the swelling remotely but is now concerned because of a significant increase in the overall edema.  It is noted that the left is worse than the right.  The swelling is associated with pain and discoloration. The patient notes that in the morning the legs are significantly improved but they steadily worsened throughout the course of the day. Elevation makes the legs better, dependency makes them much worse.   There is no history of ulcerations associated with the swelling.   The patient denies any recent changes in their medications.   The patient has not been wearing graduated compression.   The patient has no had any past angiography, interventions or vascular surgery.   The patient denies a history of DVT or PE. There is no prior history of phlebitis. There is no history of primary lymphedema.   There is no history of radiation treatment to the groin or pelvis No history of malignancies. No history of trauma or groin or pelvic surgery. No history of foreign travel or parasitic infections area   No outpatient medications have been marked as taking for the 07/25/21 encounter (Appointment) with Delana Meyer, Dolores Lory, MD.    Past Medical History:  Diagnosis Date   Chronic venous insufficiency    Dysrhythmia    A Fib during hospital stay 04/03/16   History of rheumatic fever CHILDHOOD   Hypothyroidism    Injury of tendon of long head of right biceps 2018   Pinched nerve in neck     Past Surgical History:  Procedure Laterality Date   ABDOMINAL HYSTERECTOMY     CARDIOVASCULAR STRESS TEST  03/15/2008   NORMAL LVSF/ EF 61%/ NO ISCHEMIA   CATARACT EXTRACTION W/ INTRAOCULAR LENS IMPLANT & ANTERIOR VITRECTOMY, BILATERAL Bilateral 02/2021   COLONOSCOPY WITH PROPOFOL N/A 04/17/2016   Procedure: COLONOSCOPY WITH  PROPOFOL;  Surgeon: Lucilla Lame, MD;  Location: [place];  Service: Endoscopy;  Laterality: N/A;   DILATION AND CURETTAGE OF UTERUS     ESOPHAGOGASTRODUODENOSCOPY (EGD) WITH PROPOFOL N/A 04/04/2016   Procedure: ESOPHAGOGASTRODUODENOSCOPY (EGD) WITH PROPOFOL;  Surgeon: Lucilla Lame, MD;  Location: [facility];  Service: Endoscopy;  Laterality: N/A;   GANGLION CYST EXCISION  06/09/2012   Procedure: REMOVAL GANGLION OF WRIST;  Surgeon: Magnus Sinning, MD;  Location: [place];  Service: Orthopedics;  Laterality: Left;  EXCISION OF GANGLION CYST LEFT RING FINGER  IV REGIONAL ANESTHESIA   HEMRROIDECTOMY AND ANAL FISSURE REPAIR  12/16/1963   KNEE ARTHROSCOPY WITH MEDIAL MENISECTOMY Left 11/26/2017   Procedure: KNEE ARTHROSCOPY WITH MEDIAL MENISECTOMY;  Surgeon: Corky Mull, MD;  Location: [facility];  Service: Orthopedics;  Laterality: Left;   LAPAROSCOPIC CHOLECYSTECTOMY  05/13/2002   Gall Bladder   LEFT BREAST DUCTAL EXCISION  10/11/2009   BENIGN   LEFT HAND SURG.  08/16/1979   EXTENSIVE REPAIR AND REMOVAL OF LIGAMENT/TENDON  (?CANCER)   LUMBAR LAMINECTOMY  1990  &  1976   RIGHT SIDE L4 - 5  & L5 - S1   POLYPECTOMY  04/17/2016   Procedure: POLYPECTOMY;  Surgeon: Lucilla Lame, MD;  Location: [place];  Service: Endoscopy;;   REMOVAL RIGHT GOIN MASS  08/16/1979   BENIGN   REMOVAL TOE NAILBED  AGE 58   ALL 10 TOES-- DEFORMED   RIGHT BREAST LUMPECTOMY  BENIGN   RIGHT FOOT SURG  X3   REPAIR OF 2 TOES   ROTATOR CUFF REPAIR  04/11/2008   LEFT SHOULDER   SPINE SURGERY     THYROID GOITER REMOVED  08/15/1969   TONSILLECTOMY  CHILD   VAGINAL HYSTERECTOMY/ ANTERIOR & POSTERIOR REPAIR/ TRANSOBTURATOR SLING  04/25/2005   CYSTOCELE/ RECTOCELE/ UTERINE PROLAPSE/ SUI    Social History Social History   Tobacco Use   Smoking status: Former    Years: 40.00    Types: Cigarettes    Quit date: 1993    Years since quitting: 29.6   Smokeless tobacco:  Never  Vaping Use   Vaping Use: Never used  Substance Use Topics   Alcohol use: Never   Drug use: Never    Family History Family History  Problem Relation Age of Onset   Deep vein thrombosis Mother        Right Leg   Heart disease Mother        Before age 46-  CHF   Varicose Veins Mother    Heart attack Mother    Heart disease Father        Before age 20-  PVD   Heart attack Father    Arthritis Father        Gout   Hypertension Father    Cancer Sister        Thyroid-Back   Cancer Sister        Breast and Lung   Heart disease Sister    Heart disease Sister    Other Sister        platelet disorder   Colon cancer Maternal Grandmother    Breast cancer Maternal Grandmother    Heart attack Other        father's side multiple family members   Stroke Other        father's side multiple family members   Colon cancer Maternal Uncle    Diabetes Maternal Uncle    Diabetes Maternal Aunt     Allergies  Allergen Reactions   Latex Rash    Pt denies.  Reports allergy to adhesives, not latex   Codeine Nausea And Vomiting   Contrast Media [Iodinated Diagnostic Agents] Nausea And Vomiting and Other (See Comments)    "CONVULSIONS"   Indomethacin Nausea And Vomiting and Other (See Comments)    "CONVULSIONS"   Adhesive [Tape] Rash and Other (See Comments)    BURNS SKIN     REVIEW OF SYSTEMS (Negative unless checked)  Constitutional: '[]'$ Weight loss  '[]'$ Fever  '[]'$ Chills Cardiac: '[]'$ Chest pain   '[]'$ Chest pressure   '[]'$ Palpitations   '[]'$ Shortness of breath when laying flat   '[]'$ Shortness of breath with exertion. Vascular:  '[]'$ Pain in legs with walking   '[x]'$ Pain in legs at rest  '[]'$ History of DVT   '[]'$ Phlebitis   '[x]'$ Swelling in legs   '[]'$ Varicose veins   '[]'$ Non-healing ulcers Pulmonary:   '[]'$ Uses home oxygen   '[]'$ Productive cough   '[]'$ Hemoptysis   '[]'$ Wheeze  '[]'$ COPD   '[]'$ Asthma Neurologic:  '[]'$ Dizziness   '[]'$ Seizures   '[]'$ History of stroke   '[]'$ History of TIA  '[]'$ Aphasia   '[]'$ Vissual changes   '[]'$ Weakness or  numbness in arm   '[]'$ Weakness or numbness in leg Musculoskeletal:   '[]'$ Joint swelling   '[]'$ Joint pain   '[]'$ Low back pain Hematologic:  '[]'$ Easy bruising  '[]'$ Easy bleeding   '[]'$ Hypercoagulable state   '[]'$ Anemic Gastrointestinal:  '[]'$ Diarrhea   '[]'$ Vomiting  '[x]'$ Gastroesophageal reflux/heartburn   '[]'$ Difficulty swallowing. Genitourinary:  '[]'$ Chronic kidney disease   '[]'$   Difficult urination  '[]'$ Frequent urination   '[]'$ Blood in urine Skin:  '[]'$ Rashes   '[]'$ Ulcers  Psychological:  '[]'$ History of anxiety   '[]'$  History of major depression.  Physical Examination  There were no vitals filed for this visit. There is no height or weight on file to calculate BMI. Gen: WD/WN, NAD Head: [place]/AT, No temporalis wasting.  Ear/Nose/Throat: Hearing grossly intact, nares w/o erythema or drainage, pinna without lesions Eyes: PER, EOMI, sclera nonicteric.  Neck: Supple, no gross masses.  No JVD.  Pulmonary:  Good air movement, no audible wheezing, no use of accessory muscles.  Cardiac: RRR, precordium not hyperdynamic. Vascular:  scattered varicosities present bilaterally.  Mild venous stasis changes to the legs bilaterally.  2-3+ soft pitting edema  Vessel Right Left  Radial Palpable Palpable  Gastrointestinal: soft, non-distended. No guarding/no peritoneal signs.  Musculoskeletal: M/S 5/5 throughout.  No deformity.  Neurologic: CN 2-12 intact. Pain and light touch intact in extremities.  Symmetrical.  Speech is fluent. Motor exam as listed above. Psychiatric: Judgment intact, Mood & affect appropriate for pt's clinical situation. Dermatologic: Venous rashes no ulcers noted.  No changes consistent with cellulitis. Lymph : No lichenification or skin changes of chronic lymphedema.  CBC Lab Results  Component Value Date   WBC 5.9 05/28/2021   HGB 12.8 05/28/2021   HCT 38.6 05/28/2021   MCV 92 05/28/2021   PLT 211 05/28/2021    BMET    Component Value Date/Time   NA 140 05/28/2021 0000   K 4.5 05/28/2021 0000   CL 103  05/28/2021 0000   CO2 19 (L) 05/28/2021 0000   GLUCOSE 86 05/28/2021 0000   GLUCOSE 94 01/03/2019 0942   BUN 25 05/28/2021 0000   CREATININE 0.93 05/28/2021 0000   CALCIUM 9.5 05/28/2021 0000   GFRNONAA 72 11/05/2020 1022   GFRAA 83 11/05/2020 1022   CrCl cannot be calculated (Patient's most recent lab result is older than the maximum 21 days allowed.).  COAG Lab Results  Component Value Date   INR 1.16 04/03/2016    Radiology No results found.   Assessment/Plan 1. Lymphedema I have had a long discussion with the patient regarding swelling and why it  causes symptoms.  Patient will begin wearing graduated compression stockings class 1 (20-30 mmHg) on a daily basis a prescription was given. The patient will  beginning wearing the stockings first thing in the morning and removing them in the evening. The patient is instructed specifically not to sleep in the stockings.   In addition, behavioral modification will be initiated.  This will include frequent elevation, use of over the counter pain medications and exercise such as walking.  I have reviewed systemic causes for chronic edema such as liver, kidney and cardiac etiologies.  The patient denies problems with these organ systems.    Consideration for a lymph pump will also be made based upon the effectiveness of conservative therapy.  This would help to improve the edema control and prevent sequela such as ulcers and infections   The patient will follow-up with me in 6 months.    2. Chronic venous insufficiency I have had a long discussion with the patient regarding swelling and why it  causes symptoms.  Patient will begin wearing graduated compression stockings class 1 (20-30 mmHg) on a daily basis a prescription was given. The patient will  beginning wearing the stockings first thing in the morning and removing them in the evening. The patient is instructed specifically not to sleep in  the stockings.   In addition, behavioral  modification will be initiated.  This will include frequent elevation, use of over the counter pain medications and exercise such as walking.  I have reviewed systemic causes for chronic edema such as liver, kidney and cardiac etiologies.  The patient denies problems with these organ systems.    Consideration for a lymph pump will also be made based upon the effectiveness of conservative therapy.  This would help to improve the edema control and prevent sequela such as ulcers and infections   The patient will follow-up with me in 6 months.    3. Paroxysmal atrial fibrillation (HCC) Continue antiarrhythmia medications as already ordered, these medications have been reviewed and there are no changes at this time.  Continue anticoagulation as ordered by Cardiology Service   4. Essential hypertension Continue antihypertensive medications as already ordered, these medications have been reviewed and there are no changes at this time.   5. Gastroesophageal reflux disease, unspecified whether esophagitis present Continue PPI as already ordered, this medication has been reviewed and there are no changes at this time.  Avoidence of caffeine and alcohol  Moderate elevation of the head of the bed      Hortencia Pilar, MD  07/24/2021 1:16 PM

## 2021-07-25 ENCOUNTER — Encounter (INDEPENDENT_AMBULATORY_CARE_PROVIDER_SITE_OTHER): Payer: Self-pay | Admitting: Nurse Practitioner

## 2021-07-25 ENCOUNTER — Encounter (INDEPENDENT_AMBULATORY_CARE_PROVIDER_SITE_OTHER): Payer: Self-pay | Admitting: Vascular Surgery

## 2021-07-25 ENCOUNTER — Other Ambulatory Visit: Payer: Self-pay

## 2021-07-25 ENCOUNTER — Ambulatory Visit (INDEPENDENT_AMBULATORY_CARE_PROVIDER_SITE_OTHER): Payer: Medicare Other | Admitting: Vascular Surgery

## 2021-07-25 ENCOUNTER — Ambulatory Visit (INDEPENDENT_AMBULATORY_CARE_PROVIDER_SITE_OTHER): Payer: Medicare Other

## 2021-07-25 VITALS — BP 158/75 | HR 71 | Resp 16 | Wt 171.6 lb

## 2021-07-25 DIAGNOSIS — I890 Lymphedema, not elsewhere classified: Secondary | ICD-10-CM

## 2021-07-25 DIAGNOSIS — R600 Localized edema: Secondary | ICD-10-CM | POA: Diagnosis not present

## 2021-07-25 DIAGNOSIS — I872 Venous insufficiency (chronic) (peripheral): Secondary | ICD-10-CM

## 2021-07-25 DIAGNOSIS — I10 Essential (primary) hypertension: Secondary | ICD-10-CM

## 2021-07-25 DIAGNOSIS — G609 Hereditary and idiopathic neuropathy, unspecified: Secondary | ICD-10-CM | POA: Diagnosis not present

## 2021-07-25 DIAGNOSIS — R200 Anesthesia of skin: Secondary | ICD-10-CM

## 2021-07-25 DIAGNOSIS — M7989 Other specified soft tissue disorders: Secondary | ICD-10-CM

## 2021-07-25 DIAGNOSIS — I480 Paroxysmal atrial fibrillation: Secondary | ICD-10-CM

## 2021-07-25 DIAGNOSIS — K219 Gastro-esophageal reflux disease without esophagitis: Secondary | ICD-10-CM | POA: Diagnosis not present

## 2021-07-29 ENCOUNTER — Encounter (INDEPENDENT_AMBULATORY_CARE_PROVIDER_SITE_OTHER): Payer: Self-pay | Admitting: Vascular Surgery

## 2021-08-25 ENCOUNTER — Other Ambulatory Visit: Payer: Self-pay | Admitting: Legal Medicine

## 2021-09-08 ENCOUNTER — Other Ambulatory Visit: Payer: Self-pay | Admitting: Legal Medicine

## 2021-10-07 DIAGNOSIS — Z23 Encounter for immunization: Secondary | ICD-10-CM | POA: Diagnosis not present

## 2021-10-15 DIAGNOSIS — H1132 Conjunctival hemorrhage, left eye: Secondary | ICD-10-CM | POA: Diagnosis not present

## 2021-10-15 DIAGNOSIS — H35012 Changes in retinal vascular appearance, left eye: Secondary | ICD-10-CM | POA: Diagnosis not present

## 2021-10-15 DIAGNOSIS — H353132 Nonexudative age-related macular degeneration, bilateral, intermediate dry stage: Secondary | ICD-10-CM | POA: Diagnosis not present

## 2021-10-15 DIAGNOSIS — H04123 Dry eye syndrome of bilateral lacrimal glands: Secondary | ICD-10-CM | POA: Diagnosis not present

## 2021-10-19 ENCOUNTER — Other Ambulatory Visit: Payer: Self-pay | Admitting: Legal Medicine

## 2021-12-31 ENCOUNTER — Other Ambulatory Visit: Payer: Self-pay | Admitting: Legal Medicine

## 2021-12-31 ENCOUNTER — Other Ambulatory Visit: Payer: Self-pay | Admitting: Internal Medicine

## 2021-12-31 DIAGNOSIS — R911 Solitary pulmonary nodule: Secondary | ICD-10-CM

## 2021-12-31 DIAGNOSIS — D518 Other vitamin B12 deficiency anemias: Secondary | ICD-10-CM

## 2021-12-31 DIAGNOSIS — L081 Erythrasma: Secondary | ICD-10-CM

## 2021-12-31 DIAGNOSIS — R17 Unspecified jaundice: Secondary | ICD-10-CM

## 2022-01-23 ENCOUNTER — Ambulatory Visit (INDEPENDENT_AMBULATORY_CARE_PROVIDER_SITE_OTHER): Payer: Medicare Other | Admitting: Nurse Practitioner

## 2022-02-14 ENCOUNTER — Telehealth (INDEPENDENT_AMBULATORY_CARE_PROVIDER_SITE_OTHER): Payer: Self-pay | Admitting: Nurse Practitioner

## 2022-02-14 NOTE — Telephone Encounter (Signed)
Documentation only.

## 2022-02-18 ENCOUNTER — Other Ambulatory Visit: Payer: Self-pay | Admitting: Internal Medicine

## 2022-02-18 DIAGNOSIS — D518 Other vitamin B12 deficiency anemias: Secondary | ICD-10-CM

## 2022-02-18 DIAGNOSIS — R911 Solitary pulmonary nodule: Secondary | ICD-10-CM

## 2022-02-18 DIAGNOSIS — R17 Unspecified jaundice: Secondary | ICD-10-CM

## 2022-03-18 ENCOUNTER — Ambulatory Visit (INDEPENDENT_AMBULATORY_CARE_PROVIDER_SITE_OTHER): Payer: Medicare Other | Admitting: Legal Medicine

## 2022-03-18 ENCOUNTER — Encounter: Payer: Self-pay | Admitting: Legal Medicine

## 2022-03-18 VITALS — BP 140/70 | HR 75 | Temp 98.2°F | Resp 15 | Ht 65.0 in | Wt 164.0 lb

## 2022-03-18 DIAGNOSIS — M816 Localized osteoporosis [Lequesne]: Secondary | ICD-10-CM | POA: Diagnosis not present

## 2022-03-18 DIAGNOSIS — G609 Hereditary and idiopathic neuropathy, unspecified: Secondary | ICD-10-CM | POA: Diagnosis not present

## 2022-03-18 DIAGNOSIS — I480 Paroxysmal atrial fibrillation: Secondary | ICD-10-CM

## 2022-03-18 DIAGNOSIS — I10 Essential (primary) hypertension: Secondary | ICD-10-CM | POA: Diagnosis not present

## 2022-03-18 DIAGNOSIS — E038 Other specified hypothyroidism: Secondary | ICD-10-CM | POA: Diagnosis not present

## 2022-03-18 DIAGNOSIS — I739 Peripheral vascular disease, unspecified: Secondary | ICD-10-CM

## 2022-03-18 DIAGNOSIS — N951 Menopausal and female climacteric states: Secondary | ICD-10-CM

## 2022-03-18 DIAGNOSIS — K219 Gastro-esophageal reflux disease without esophagitis: Secondary | ICD-10-CM

## 2022-03-18 DIAGNOSIS — G6289 Other specified polyneuropathies: Secondary | ICD-10-CM

## 2022-03-18 DIAGNOSIS — H353231 Exudative age-related macular degeneration, bilateral, with active choroidal neovascularization: Secondary | ICD-10-CM

## 2022-03-18 DIAGNOSIS — G5603 Carpal tunnel syndrome, bilateral upper limbs: Secondary | ICD-10-CM | POA: Diagnosis not present

## 2022-03-18 DIAGNOSIS — E782 Mixed hyperlipidemia: Secondary | ICD-10-CM | POA: Insufficient documentation

## 2022-03-18 NOTE — Progress Notes (Signed)
? ?Subjective:  ?Patient ID: Sydney Ayala, female    DOB: [birth year]  Age: 76 y.o. MRN: [id number] ? ?Chief Complaint  ?Patient presents with  ? Hypertension  ? Atrial Fibrillation  ? Hyperlipidemia  ? ? ?HPI: chronic visit ? ?Afib: Patient is taking Diltiazem 120 mg daily. ?Chronic atria fibrillation on no anticoagulants per cardiology ?Marland KitchenHypothyroidism: She takes synthroid 125 mcg daily.  ? ?Eye surgery Dr. Geanie Berlin, she has wet macular degeneration and hemorrhage. ?Current Outpatient Medications on File Prior to Visit  ?Medication Sig Dispense Refill  ? acetaminophen (TYLENOL) 500 MG tablet Take 1,000 mg by mouth every 8 (eight) hours as needed for mild pain.    ? BD SYRINGE SLIP TIP 25G X 5/8" 1 ML MISC USE AS DIRECTED - ONCE A MONTH 4 each 3  ? clotrimazole-betamethasone (LOTRISONE) cream APPLY TO AFFECTED AREA TWICE A DAY 30 g 2  ? cyanocobalamin (,VITAMIN B-12,) 1000 MCG/ML injection INJECT 1 ML MONTHLY 3 mL 3  ? diltiazem (TIAZAC) 120 MG 24 hr capsule Take 1 capsule by mouth daily.    ? Multiple Vitamins-Minerals (50+ ADULT EYE HEALTH PO) Take by mouth.    ? Multiple Vitamins-Minerals ([employer] SPECTRAVITE ADULT 50+) TABS Take 1 tablet by mouth daily.    ? naproxen (NAPROSYN) 500 MG tablet TAKE 1 TABLET BY MOUTH EVERY DAY WITH FOOD 30 tablet 6  ? PARoxetine (PAXIL) 10 MG tablet TAKE 1 TABLET BY MOUTH EVERY DAY 90 tablet 2  ? potassium chloride SA (K-DUR,KLOR-CON) 20 MEQ tablet Take 1 tablet (20 mEq total) by mouth once. (Patient taking differently: Take 20 mEq by mouth.) 30 tablet 3  ? Probiotic Product (DIGESTIVE ADVANTAGE PO) Take 1 each by mouth every other day.    ? SYNTHROID 125 MCG tablet TAKE 1 TABLET BY MOUTH EVERY DAY 90 tablet 2  ? Syringe/Needle, Disp, (SYRINGE 3CC/22GX1") 22G X 1" 3 ML MISC 1 Syringe by Does not apply route once. Please dispense syringe/needle for b12 injections. Pt will self administer her own injections. 50 each 4  ? ?No current facility-administered medications on file prior to visit.   ? ?Past Medical History:  ?Diagnosis Date  ? Chronic venous insufficiency   ? Dysrhythmia   ? A Fib during hospital stay 04/03/16  ? History of rheumatic fever CHILDHOOD  ? Hypothyroidism   ? Injury of tendon of long head of right biceps 2018  ? Pinched nerve in neck   ? ?Past Surgical History:  ?Procedure Laterality Date  ? ABDOMINAL HYSTERECTOMY    ? CARDIOVASCULAR STRESS TEST  03/15/2008  ? NORMAL LVSF/ EF 61%/ NO ISCHEMIA  ? CATARACT EXTRACTION W/ INTRAOCULAR LENS IMPLANT & ANTERIOR VITRECTOMY, BILATERAL Bilateral 02/2021  ? COLONOSCOPY WITH PROPOFOL N/A 04/17/2016  ? Procedure: COLONOSCOPY WITH PROPOFOL;  Surgeon: Lucilla Lame, MD;  Location: [place];  Service: Endoscopy;  Laterality: N/A;  ? DILATION AND CURETTAGE OF UTERUS    ? ESOPHAGOGASTRODUODENOSCOPY (EGD) WITH PROPOFOL N/A 04/04/2016  ? Procedure: ESOPHAGOGASTRODUODENOSCOPY (EGD) WITH PROPOFOL;  Surgeon: Lucilla Lame, MD;  Location: [facility];  Service: Endoscopy;  Laterality: N/A;  ? GANGLION CYST EXCISION  06/09/2012  ? Procedure: REMOVAL GANGLION OF WRIST;  Surgeon: Magnus Sinning, MD;  Location: [place];  Service: Orthopedics;  Laterality: Left;  EXCISION OF GANGLION CYST LEFT RING FINGER ? ?IV REGIONAL ANESTHESIA  ? HEMRROIDECTOMY AND ANAL FISSURE REPAIR  12/16/1963  ? KNEE ARTHROSCOPY WITH MEDIAL MENISECTOMY Left 11/26/2017  ? Procedure: KNEE ARTHROSCOPY WITH MEDIAL MENISECTOMY;  Surgeon: Corky Mull, MD;  Location: [facility];  Service: Orthopedics;  Laterality: Left;  ? LAPAROSCOPIC CHOLECYSTECTOMY  05/13/2002  ? Gall Bladder  ? LEFT BREAST DUCTAL EXCISION  10/11/2009  ? BENIGN  ? LEFT HAND SURG.  08/16/1979  ? EXTENSIVE REPAIR AND REMOVAL OF LIGAMENT/TENDON  (?CANCER)  ? [place]  ? RIGHT SIDE L4 - 5  & L5 - S1  ? POLYPECTOMY  04/17/2016  ? Procedure: POLYPECTOMY;  Surgeon: Lucilla Lame, MD;  Location: [place];  Service: Endoscopy;;  ? REMOVAL RIGHT GOIN MASS  08/16/1979  ?  BENIGN  ? REMOVAL TOE NAILBED  AGE 89  ? ALL 10 TOES-- DEFORMED  ? RIGHT BREAST LUMPECTOMY    ? BENIGN  ? RIGHT FOOT SURG  X3  ? REPAIR OF 2 TOES  ? ROTATOR CUFF REPAIR  04/11/2008  ? LEFT SHOULDER  ? SPINE SURGERY    ? THYROID GOITER REMOVED  08/15/1969  ? TONSILLECTOMY  CHILD  ? VAGINAL HYSTERECTOMY/ ANTERIOR & POSTERIOR REPAIR/ TRANSOBTURATOR SLING  04/25/2005  ? CYSTOCELE/ RECTOCELE/ UTERINE PROLAPSE/ SUI  ?  ?Family History  ?Problem Relation Age of Onset  ? Deep vein thrombosis Mother   ?     Right Leg  ? Heart disease Mother   ?     Before age 77-  CHF  ? Varicose Veins Mother   ? Heart attack Mother   ? Heart disease Father   ?     Before age 33-  PVD  ? Heart attack Father   ? Arthritis Father   ?     Gout  ? Hypertension Father   ? Cancer Sister   ?     Thyroid-Back  ? Cancer Sister   ?     Breast and Lung  ? Heart disease Sister   ? Heart disease Sister   ? Other Sister   ?     platelet disorder  ? Colon cancer Maternal Grandmother   ? Breast cancer Maternal Grandmother   ? Heart attack Other   ?     father's side multiple family members  ? Stroke Other   ?     father's side multiple family members  ? Colon cancer Maternal Uncle   ? Diabetes Maternal Uncle   ? Diabetes Maternal Aunt   ? ?Social History  ? ?Socioeconomic History  ? Marital status: Divorced  ?  Spouse name: Not on file  ? Number of children: 3  ? Years of education: Not on file  ? Highest education level: 8th grade  ?Occupational History  ? Not on file  ?Tobacco Use  ? Smoking status: Former  ?  Years: 40.00  ?  Types: Cigarettes  ?  Quit date: 87  ?  Years since quitting: 30.2  ? Smokeless tobacco: Never  ?Vaping Use  ? Vaping Use: Never used  ?Substance and Sexual Activity  ? Alcohol use: Never  ? Drug use: Never  ? Sexual activity: Not Currently  ?Other Topics Concern  ? Not on file  ?Social History Narrative  ? Lives alone  ? Retired  ? Caffeine: coffee 3 cups daily  ? ?Social Determinants of Health  ? ?Financial Resource Strain: Not  on file  ?Food Insecurity: Not on file  ?Transportation Needs: Not on file  ?Physical Activity: Not on file  ?Stress: Not on file  ?Social Connections: Not on file  ? ? ?Review of Systems  ?  Constitutional:  Negative for chills, fatigue and fever.  ?HENT:  Negative for congestion, ear pain and sore throat.   ?Eyes:  Negative for visual disturbance.  ?Respiratory:  Negative for cough and shortness of breath.   ?Cardiovascular:  Negative for chest pain and palpitations.  ?Gastrointestinal:  Negative for abdominal pain, constipation, diarrhea, nausea and vomiting.  ?Endocrine: Negative for polydipsia, polyphagia and polyuria.  ?Genitourinary:  Negative for difficulty urinating and dysuria.  ?Musculoskeletal:  Positive for arthralgias (Both hands). Negative for back pain and myalgias.  ?Skin:  Negative for rash.  ?Neurological:  Negative for headaches.  ?Psychiatric/Behavioral:  Negative for dysphoric mood. The patient is not nervous/anxious.   ? ? ?Objective:  ?BP 140/70   Pulse 75   Temp 98.2 ?F (36.8 ?C)   Resp 15   Ht '5\' 5"'$  (1.651 m)   Wt 164 lb (74.4 kg)   SpO2 95%   BMI 27.29 kg/m?  ? ? ?  03/18/2022  ? 10:53 AM 07/25/2021  ?  3:35 PM 07/18/2021  ? 11:58 AM  ?BP/Weight  ?Systolic BP 594 585 929  ?Diastolic BP 70 75 78  ?Wt. (Lbs) 164 171.6 167  ?BMI 27.29 kg/m2 28.56 kg/m2 27.79 kg/m2  ? ? ?Physical Exam ?Vitals reviewed.  ?Constitutional:   ?   General: She is in acute distress.  ?   Appearance: Normal appearance. She is normal weight.  ?HENT:  ?   Head: Normocephalic.  ?   Right Ear: Tympanic membrane normal.  ?   Left Ear: Tympanic membrane normal.  ?   Mouth/Throat:  ?   Mouth: Mucous membranes are moist.  ?   Pharynx: Oropharynx is clear.  ?Eyes:  ?   Extraocular Movements: Extraocular movements intact.  ?   Conjunctiva/sclera: Conjunctivae normal.  ?   Pupils: Pupils are equal, round, and reactive to light.  ?   Comments: Left eye blured after surgery  ?Neck:  ?   Comments: Crepitation neck, full ROM,  positive Lhermitte to left.  No left muscle weakness ?Cardiovascular:  ?   Rate and Rhythm: Normal rate. Rhythm irregular.  ?   Pulses: Normal pulses.  ?   Heart sounds: Normal heart sounds. No murmur heard. ?

## 2022-03-19 ENCOUNTER — Other Ambulatory Visit: Payer: Self-pay | Admitting: Legal Medicine

## 2022-03-19 DIAGNOSIS — E038 Other specified hypothyroidism: Secondary | ICD-10-CM

## 2022-03-19 LAB — COMPREHENSIVE METABOLIC PANEL
ALT: 16 IU/L (ref 0–32)
AST: 29 IU/L (ref 0–40)
Albumin/Globulin Ratio: 1.5 (ref 1.2–2.2)
Albumin: 4.6 g/dL (ref 3.7–4.7)
Alkaline Phosphatase: 108 IU/L (ref 44–121)
BUN/Creatinine Ratio: 24 (ref 12–28)
BUN: 21 mg/dL (ref 8–27)
Bilirubin Total: 0.4 mg/dL (ref 0.0–1.2)
CO2: 24 mmol/L (ref 20–29)
Calcium: 9.8 mg/dL (ref 8.7–10.3)
Chloride: 104 mmol/L (ref 96–106)
Creatinine, Ser: 0.88 mg/dL (ref 0.57–1.00)
Globulin, Total: 3.1 g/dL (ref 1.5–4.5)
Glucose: 86 mg/dL (ref 70–99)
Potassium: 5 mmol/L (ref 3.5–5.2)
Sodium: 143 mmol/L (ref 134–144)
Total Protein: 7.7 g/dL (ref 6.0–8.5)
eGFR: 69 mL/min/{1.73_m2} (ref >59)

## 2022-03-19 LAB — CBC WITH DIFFERENTIAL/PLATELET
Basophils Absolute: 0 10*3/uL (ref 0.0–0.2)
Basos: 1 %
EOS (ABSOLUTE): 0.4 10*3/uL (ref 0.0–0.4)
Eos: 7 %
Hematocrit: 40.5 % (ref 34.0–46.6)
Hemoglobin: 13.6 g/dL (ref 11.1–15.9)
Immature Grans (Abs): 0 10*3/uL (ref 0.0–0.1)
Immature Granulocytes: 0 %
Lymphocytes Absolute: 2 10*3/uL (ref 0.7–3.1)
Lymphs: 37 %
MCH: 30.6 pg (ref 26.6–33.0)
MCHC: 33.6 g/dL (ref 31.5–35.7)
MCV: 91 fL (ref 79–97)
Monocytes Absolute: 0.5 10*3/uL (ref 0.1–0.9)
Monocytes: 9 %
Neutrophils Absolute: 2.6 10*3/uL (ref 1.4–7.0)
Neutrophils: 46 %
Platelets: 224 10*3/uL (ref 150–450)
RBC: 4.45 x10E6/uL (ref 3.77–5.28)
RDW: 13.3 % (ref 11.7–15.4)
WBC: 5.5 10*3/uL (ref 3.4–10.8)

## 2022-03-19 LAB — LIPID PANEL
Chol/HDL Ratio: 2.8 ratio (ref 0.0–4.4)
Cholesterol, Total: 189 mg/dL (ref 100–199)
HDL: 67 mg/dL (ref >39)
LDL Chol Calc (NIH): 109 mg/dL — ABNORMAL HIGH (ref 0–99)
Triglycerides: 72 mg/dL (ref 0–149)
VLDL Cholesterol Cal: 13 mg/dL (ref 5–40)

## 2022-03-19 LAB — TSH: TSH: 6.81 u[IU]/mL — ABNORMAL HIGH (ref 0.450–4.500)

## 2022-03-19 LAB — CARDIOVASCULAR RISK ASSESSMENT

## 2022-03-19 MED ORDER — LEVOTHYROXINE SODIUM 137 MCG PO TABS: 125.0000 ug | ORAL_TABLET | Freq: Every day | ORAL | 2 refills | Status: DC

## 2022-03-19 NOTE — Progress Notes (Signed)
LDL cholesterol 109 high, TSH high 6.810- increase to 167mg, recheck thyroid 6 weeks, Kidney and liver tests normal, CBC normal ?lp

## 2022-03-20 ENCOUNTER — Other Ambulatory Visit: Payer: Self-pay

## 2022-03-20 DIAGNOSIS — G6289 Other specified polyneuropathies: Secondary | ICD-10-CM

## 2022-03-20 DIAGNOSIS — G5603 Carpal tunnel syndrome, bilateral upper limbs: Secondary | ICD-10-CM

## 2022-03-25 ENCOUNTER — Telehealth: Payer: Self-pay | Admitting: Legal Medicine

## 2022-03-25 NOTE — Telephone Encounter (Signed)
? ?  Sydney Ayala has been scheduled for the following appointment: ? ?WHAT: BONE DENSITY ?WHERE: [place] ?DATE: 03/27/22 ?TIME: 10:30 AM ARRIVAL TIME ? ?Patient has been made aware. ? ? ?

## 2022-03-27 DIAGNOSIS — N959 Unspecified menopausal and perimenopausal disorder: Secondary | ICD-10-CM | POA: Diagnosis not present

## 2022-03-27 DIAGNOSIS — M85851 Other specified disorders of bone density and structure, right thigh: Secondary | ICD-10-CM | POA: Diagnosis not present

## 2022-04-04 DIAGNOSIS — G5603 Carpal tunnel syndrome, bilateral upper limbs: Secondary | ICD-10-CM | POA: Diagnosis not present

## 2022-04-23 DIAGNOSIS — G5603 Carpal tunnel syndrome, bilateral upper limbs: Secondary | ICD-10-CM | POA: Diagnosis not present

## 2022-04-30 ENCOUNTER — Other Ambulatory Visit: Payer: Medicare Other

## 2022-04-30 DIAGNOSIS — E038 Other specified hypothyroidism: Secondary | ICD-10-CM

## 2022-05-01 DIAGNOSIS — G5603 Carpal tunnel syndrome, bilateral upper limbs: Secondary | ICD-10-CM | POA: Diagnosis not present

## 2022-05-01 LAB — TSH: TSH: 1.57 u[IU]/mL (ref 0.450–4.500)

## 2022-05-01 NOTE — Progress Notes (Signed)
TSH 1.57 normal lp

## 2022-05-21 DIAGNOSIS — I340 Nonrheumatic mitral (valve) insufficiency: Secondary | ICD-10-CM | POA: Diagnosis not present

## 2022-05-21 DIAGNOSIS — I739 Peripheral vascular disease, unspecified: Secondary | ICD-10-CM | POA: Diagnosis not present

## 2022-05-21 DIAGNOSIS — I10 Essential (primary) hypertension: Secondary | ICD-10-CM | POA: Diagnosis not present

## 2022-05-21 DIAGNOSIS — I480 Paroxysmal atrial fibrillation: Secondary | ICD-10-CM | POA: Diagnosis not present

## 2022-05-30 DIAGNOSIS — G5601 Carpal tunnel syndrome, right upper limb: Secondary | ICD-10-CM | POA: Diagnosis not present

## 2022-06-12 ENCOUNTER — Other Ambulatory Visit: Payer: Self-pay | Admitting: Legal Medicine

## 2022-06-19 ENCOUNTER — Other Ambulatory Visit: Payer: Self-pay | Admitting: Internal Medicine

## 2022-06-26 DIAGNOSIS — H35012 Changes in retinal vascular appearance, left eye: Secondary | ICD-10-CM | POA: Diagnosis not present

## 2022-06-26 DIAGNOSIS — H353112 Nonexudative age-related macular degeneration, right eye, intermediate dry stage: Secondary | ICD-10-CM | POA: Diagnosis not present

## 2022-06-26 DIAGNOSIS — H353132 Nonexudative age-related macular degeneration, bilateral, intermediate dry stage: Secondary | ICD-10-CM | POA: Diagnosis not present

## 2022-07-10 DIAGNOSIS — Z4789 Encounter for other orthopedic aftercare: Secondary | ICD-10-CM | POA: Diagnosis not present

## 2022-07-10 DIAGNOSIS — M65351 Trigger finger, right little finger: Secondary | ICD-10-CM | POA: Diagnosis not present

## 2022-07-10 DIAGNOSIS — G5601 Carpal tunnel syndrome, right upper limb: Secondary | ICD-10-CM | POA: Diagnosis not present

## 2022-09-11 ENCOUNTER — Other Ambulatory Visit: Payer: Self-pay | Admitting: Legal Medicine

## 2022-09-25 ENCOUNTER — Encounter: Payer: Self-pay | Admitting: Nurse Practitioner

## 2022-09-25 ENCOUNTER — Ambulatory Visit (INDEPENDENT_AMBULATORY_CARE_PROVIDER_SITE_OTHER): Payer: Medicare Other | Admitting: Nurse Practitioner

## 2022-09-25 VITALS — BP 124/74 | HR 79 | Temp 97.2°F | Ht 65.0 in | Wt 162.0 lb

## 2022-09-25 DIAGNOSIS — L237 Allergic contact dermatitis due to plants, except food: Secondary | ICD-10-CM

## 2022-09-25 MED ORDER — HYDROXYZINE PAMOATE 25 MG PO CAPS: 25.0000 mg | ORAL_CAPSULE | Freq: Three times a day (TID) | ORAL | 0 refills | Status: DC | PRN

## 2022-09-25 MED ORDER — TRIAMCINOLONE ACETONIDE 40 MG/ML IJ SUSP
60.0000 mg | Freq: Once | INTRAMUSCULAR | Status: AC
  Administered 2022-09-25: 60 mg via INTRAMUSCULAR

## 2022-09-25 NOTE — Patient Instructions (Addendum)
Kenalog injection given in office Take Hydroxyzine 25 mg up to three times daily for itching Continue topical Calamine lotion Follow-up as needed      Poison Ivy Dermatitis Poison ivy dermatitis is redness and soreness of the skin caused by chemicals in the leaves of the poison ivy plant. You may have very bad itching, swelling, a rash, and blisters. What are the causes? Touching a poison ivy plant. Touching something that has the chemical on it. This may include animals or objects that have come in contact with the plant. What increases the risk? Going outdoors often in wooded or [place] areas. Going outdoors without wearing protective clothing, such as closed shoes, long pants, and a long-sleeved shirt. What are the signs or symptoms?  Skin redness. Very bad itching. A rash that often includes bumps and blisters. The rash usually appears 48 hours after exposure, if you have been exposed before. If this is the first time you have been exposed, the rash may not appear until a week after exposure. Swelling. This may occur if the reaction is very bad. Symptoms usually last for 1-2 weeks. The first time you develop this condition, symptoms may last 3-4 weeks. How is this treated? This condition may be treated with: Hydrocortisone cream or calamine lotion to relieve itching. Oatmeal baths to soothe the skin. Medicines, such as over-the-counter antihistamine tablets. Oral steroid medicine for more severe reactions. Follow these instructions at home: Medicines Take or apply over-the-counter and prescription medicines only as told by your doctor. Use hydrocortisone cream or calamine lotion as needed to help with itching. General instructions Do not scratch or rub your skin. Put a cold, wet cloth (cold compress) on the affected areas or take baths in cool water. This will help with itching. Avoid hot baths and showers. Take oatmeal baths as needed. Use colloidal oatmeal. You can get  this at a pharmacy or grocery store. Follow the instructions on the package. While you have the rash, wash your clothes right after you wear them. Keep all follow-up visits as told by your health care provider. This is important. How is this prevented?  Know what poison ivy looks like, so you can avoid it. This plant has three leaves with flowering branches on a single stem. The leaves are glossy. The leaves have uneven edges that come to a point at the front. If you touch poison ivy, wash your skin with soap and water right away. Be sure to wash under your fingernails. When hiking or camping, wear long pants, a long-sleeved shirt, tall socks, and hiking boots. You can also use a lotion on your skin that helps to prevent contact with poison ivy. If you think that your clothes or outdoor gear came in contact with poison ivy, rinse them off with a garden hose before you bring them inside your house. When doing yard work or gardening, wear gloves, long sleeves, long pants, and boots. Wash your garden tools and gloves if they come in contact with poison ivy. If you think that your pet has come into contact with poison ivy, wash him or her with pet shampoo and water. Make sure to wear gloves while washing your pet. Contact a doctor if: You have open sores in the rash area. You have more redness, swelling, or pain in the rash area. You have redness that spreads beyond the rash area. You have fluid, blood, or pus coming from the rash area. You have a fever. You have a rash over a large  area of your body. You have a rash on your eyes, mouth, or genitals. Your rash does not get better after a few weeks. Get help right away if: Your face swells or your eyes swell shut. You have trouble breathing. You have trouble swallowing. These symptoms may be an emergency. Do not wait to see if the symptoms will go away. Get medical help right away. Call your local emergency services (911 in the [place] Do not  drive yourself to the hospital. Summary Poison ivy dermatitis is redness and soreness of the skin caused by chemicals in the leaves of the poison ivy plant. You may have skin redness, very bad itching, swelling, and a rash. Do not scratch or rub your skin. Take or apply over-the-counter and prescription medicines only as told by your doctor. This information is not intended to replace advice given to you by your health care provider. Make sure you discuss any questions you have with your health care provider. Document Revised: 09/16/2021 Document Reviewed: 09/16/2021 Elsevier Patient Education  [place].

## 2022-09-25 NOTE — Progress Notes (Signed)
Acute Office Visit  Subjective:    Patient ID: Sydney Ayala, female    DOB: 1946-02-25, 76 y.o.   MRN: [id number]  Chief Complaint  Patient presents with   Poison Ivy    HPI: Patient is in today for  rash on her arms and legs, [place] and come into contact with poison oak. Denies fever, headache, or URI symptoms. Onset was three days ago.Treatment has included topical Tecnu and Calamine lotion.  Past Medical History:  Diagnosis Date   Chronic venous insufficiency    Dysrhythmia    A Fib during hospital stay 04/03/16   History of rheumatic fever CHILDHOOD   Hypothyroidism    Injury of tendon of long head of right biceps 2018   Pinched nerve in neck     Past Surgical History:  Procedure Laterality Date   ABDOMINAL HYSTERECTOMY     CARDIOVASCULAR STRESS TEST  03/15/2008   NORMAL LVSF/ EF 61%/ NO ISCHEMIA   CATARACT EXTRACTION W/ INTRAOCULAR LENS IMPLANT & ANTERIOR VITRECTOMY, BILATERAL Bilateral 02/2021   COLONOSCOPY WITH PROPOFOL N/A 04/17/2016   Procedure: COLONOSCOPY WITH PROPOFOL;  Surgeon: Lucilla Lame, MD;  Location: [facility];  Service: Endoscopy;  Laterality: N/A;   DILATION AND CURETTAGE OF UTERUS     ESOPHAGOGASTRODUODENOSCOPY (EGD) WITH PROPOFOL N/A 04/04/2016   Procedure: ESOPHAGOGASTRODUODENOSCOPY (EGD) WITH PROPOFOL;  Surgeon: Lucilla Lame, MD;  Location: [facility];  Service: Endoscopy;  Laterality: N/A;   GANGLION CYST EXCISION  06/09/2012   Procedure: REMOVAL GANGLION OF WRIST;  Surgeon: Magnus Sinning, MD;  Location: [facility];  Service: Orthopedics;  Laterality: Left;  EXCISION OF GANGLION CYST LEFT RING FINGER  IV REGIONAL ANESTHESIA   HEMRROIDECTOMY AND ANAL FISSURE REPAIR  12/16/1963   KNEE ARTHROSCOPY WITH MEDIAL MENISECTOMY Left 11/26/2017   Procedure: KNEE ARTHROSCOPY WITH MEDIAL MENISECTOMY;  Surgeon: Corky Mull, MD;  Location: [facility];  Service: Orthopedics;   Laterality: Left;   LAPAROSCOPIC CHOLECYSTECTOMY  05/13/2002   Gall Bladder   LEFT BREAST DUCTAL EXCISION  10/11/2009   BENIGN   LEFT HAND SURG.  08/16/1979   EXTENSIVE REPAIR AND REMOVAL OF LIGAMENT/TENDON  (?CANCER)   LUMBAR LAMINECTOMY  1990  &  1976   RIGHT SIDE L4 - 5  & L5 - S1   POLYPECTOMY  04/17/2016   Procedure: POLYPECTOMY;  Surgeon: Lucilla Lame, MD;  Location: [facility];  Service: Endoscopy;;   REMOVAL RIGHT GOIN MASS  08/16/1979   BENIGN   REMOVAL TOE NAILBED  AGE 68   ALL 10 TOES-- DEFORMED   RIGHT BREAST LUMPECTOMY     BENIGN   RIGHT FOOT SURG  X3   REPAIR OF 2 TOES   ROTATOR CUFF REPAIR  04/11/2008   LEFT SHOULDER   SPINE SURGERY     THYROID GOITER REMOVED  08/15/1969   TONSILLECTOMY  CHILD   VAGINAL HYSTERECTOMY/ ANTERIOR & POSTERIOR REPAIR/ TRANSOBTURATOR SLING  04/25/2005   CYSTOCELE/ RECTOCELE/ UTERINE PROLAPSE/ SUI    Family History  Problem Relation Age of Onset   Deep vein thrombosis Mother        Right Leg   Heart disease Mother        Before age 10-  CHF   Varicose Veins Mother    Heart attack Mother    Heart disease Father        Before age 87-  PVD   Heart attack Father  Arthritis Father        Gout   Hypertension Father    Cancer Sister        Thyroid-Back   Cancer Sister        Breast and Lung   Heart disease Sister    Heart disease Sister    Other Sister        platelet disorder   Colon cancer Maternal Grandmother    Breast cancer Maternal Grandmother    Heart attack Other        father's side multiple family members   Stroke Other        father's side multiple family members   Colon cancer Maternal Uncle    Diabetes Maternal Uncle    Diabetes Maternal Aunt     Social History   Socioeconomic History   Marital status: Divorced    Spouse name: Not on file   Number of children: 3   Years of education: Not on file   Highest education level: 8th grade  Occupational History   Not on file  Tobacco Use   Smoking  status: Former    Years: 40.00    Types: Cigarettes    Quit date: 1993    Years since quitting: 30.7   Smokeless tobacco: Never  Vaping Use   Vaping Use: Never used  Substance and Sexual Activity   Alcohol use: Never   Drug use: Never   Sexual activity: Not Currently  Other Topics Concern   Not on file  Social History Narrative   Lives alone   Retired   Caffeine: coffee 3 cups daily   Social Determinants of Health   Financial Resource Strain: Not on file  Food Insecurity: Not on file  Transportation Needs: Not on file  Physical Activity: Not on file  Stress: Not on file  Social Connections: Not on file  Intimate Partner Violence: Not on file    Outpatient Medications Prior to Visit  Medication Sig Dispense Refill   acetaminophen (TYLENOL) 500 MG tablet Take 1,000 mg by mouth every 8 (eight) hours as needed for mild pain.     BD SYRINGE SLIP TIP 25G X 5/8" 1 ML MISC USE AS DIRECTED - ONCE A MONTH 4 each 3   clotrimazole-betamethasone (LOTRISONE) cream APPLY TO AFFECTED AREA TWICE A DAY 30 g 2   cyanocobalamin (,VITAMIN B-12,) 1000 MCG/ML injection INJECT 1 ML MONTHLY 3 mL 3   diltiazem (TIAZAC) 120 MG 24 hr capsule Take 1 capsule by mouth daily.     levothyroxine (SYNTHROID) 137 MCG tablet Take 1 tablet (137 mcg total) by mouth daily. 90 tablet 2   Multiple Vitamins-Minerals (50+ ADULT EYE HEALTH PO) Take by mouth.     Multiple Vitamins-Minerals ([facility] SPECTRAVITE ADULT 50+) TABS Take 1 tablet by mouth daily.     naproxen (NAPROSYN) 500 MG tablet TAKE 1 TABLET BY MOUTH EVERY DAY WITH FOOD 30 tablet 6   PARoxetine (PAXIL) 10 MG tablet TAKE 1 TABLET BY MOUTH EVERY DAY 90 tablet 2   potassium chloride SA (K-DUR,KLOR-CON) 20 MEQ tablet Take 1 tablet (20 mEq total) by mouth once. (Patient taking differently: Take 20 mEq by mouth.) 30 tablet 3   Probiotic Product (DIGESTIVE ADVANTAGE PO) Take 1 each by mouth every other day.     Syringe/Needle, Disp, (SYRINGE 3CC/22GX1") 22G X 1" 3  ML MISC 1 Syringe by Does not apply route once. Please dispense syringe/needle for b12 injections. Pt will self administer her own injections. 50 each  4   No facility-administered medications prior to visit.    Allergies  Allergen Reactions   Latex Rash    Pt denies.  Reports allergy to adhesives, not latex   Codeine Nausea And Vomiting   Contrast Media [Iodinated Contrast Media] Nausea And Vomiting and Other (See Comments)    "CONVULSIONS"   Indomethacin Nausea And Vomiting and Other (See Comments)    "CONVULSIONS"   Adhesive [Tape] Rash and Other (See Comments)    BURNS SKIN    Review of Systems See pertinent positives and negatives per HPI.     Objective:    Physical Exam Vitals reviewed.  Constitutional:      Appearance: Normal appearance.  Skin:    Findings: Rash present. Rash is macular and papular.          Comments: Macularpapular rash noted to bilateral upper forearms and right lower extremity  Neurological:     Mental Status: She is alert.     BP 124/74   Pulse 79   Temp (!) 97.2 F (36.2 C)   Ht 5' 5"  (1.651 m)   Wt 162 lb (73.5 kg)   SpO2 100%   BMI 26.96 kg/m   Wt Readings from Last 3 Encounters:  09/25/22 162 lb (73.5 kg)  03/18/22 164 lb (74.4 kg)  07/25/21 171 lb 9.6 oz (77.8 kg)    Health Maintenance Due  Topic Date Due   TETANUS/TDAP  Never done   Zoster Vaccines- Shingrix (1 of 2) Never done   MAMMOGRAM  01/15/2019   Pneumonia Vaccine 86+ Years old (2 - PPSV23 or PCV20) 11/23/2019   COLONOSCOPY (Pts 45-44yr Insurance coverage will need to be confirmed)  04/17/2021   INFLUENZA VACCINE  07/15/2022    Lab Results  Component Value Date   TSH 1.570 04/30/2022   Lab Results  Component Value Date   WBC 5.5 03/18/2022   HGB 13.6 03/18/2022   HCT 40.5 03/18/2022   MCV 91 03/18/2022   PLT 224 03/18/2022   Lab Results  Component Value Date   NA 143 03/18/2022   K 5.0 03/18/2022   CO2 24 03/18/2022   GLUCOSE 86 03/18/2022   BUN  21 03/18/2022   CREATININE 0.88 03/18/2022   BILITOT 0.4 03/18/2022   ALKPHOS 108 03/18/2022   AST 29 03/18/2022   ALT 16 03/18/2022   PROT 7.7 03/18/2022   ALBUMIN 4.6 03/18/2022   CALCIUM 9.8 03/18/2022   ANIONGAP 10 01/03/2019   EGFR 69 03/18/2022   Lab Results  Component Value Date   CHOL 189 03/18/2022   Lab Results  Component Value Date   HDL 67 03/18/2022   Lab Results  Component Value Date   LDLCALC 109 (H) 03/18/2022   Lab Results  Component Value Date   TRIG 72 03/18/2022   Lab Results  Component Value Date   CHOLHDL 2.8 03/18/2022        Assessment & Plan:    1. Poison ivy dermatitis - triamcinolone acetonide (KENALOG-40) injection 60 mg - hydrOXYzine (VISTARIL) 25 MG capsule; Take 1 capsule (25 mg total) by mouth every 8 (eight) hours as needed.  Dispense: 30 capsule; Refill: 0   Kenalog injection given in office Take Hydroxyzine 25 mg up to three times daily for itching Continue topical Calamine lotion Follow-up as needed     Follow-up: PRN  An After Visit Summary was printed and given to the patient.  I, SRip Harbour NP, have reviewed all documentation for this visit.  The documentation on 09/25/22 for the exam, diagnosis, procedures, and orders are all accurate and complete.   Signed, Rip Harbour, NP [place] [phone]

## 2022-10-07 DIAGNOSIS — M65351 Trigger finger, right little finger: Secondary | ICD-10-CM | POA: Diagnosis not present

## 2022-10-07 DIAGNOSIS — G5601 Carpal tunnel syndrome, right upper limb: Secondary | ICD-10-CM | POA: Diagnosis not present

## 2022-12-25 ENCOUNTER — Other Ambulatory Visit: Payer: Self-pay

## 2022-12-25 DIAGNOSIS — E038 Other specified hypothyroidism: Secondary | ICD-10-CM

## 2022-12-25 MED ORDER — LEVOTHYROXINE SODIUM 137 MCG PO TABS: 125.0000 ug | ORAL_TABLET | Freq: Every day | ORAL | 0 refills | Status: DC

## 2022-12-29 ENCOUNTER — Other Ambulatory Visit: Payer: Self-pay

## 2022-12-29 DIAGNOSIS — E038 Other specified hypothyroidism: Secondary | ICD-10-CM

## 2022-12-29 MED ORDER — LEVOTHYROXINE SODIUM 137 MCG PO TABS: 125.0000 ug | ORAL_TABLET | Freq: Every day | ORAL | 0 refills | Status: DC

## 2023-01-15 ENCOUNTER — Telehealth: Payer: Self-pay

## 2023-01-15 NOTE — Telephone Encounter (Signed)
Patient called and stated that her toes and the side of feet are red and swollen.   Patient would like for someone to look at her feet when she come in for her blood work Monday.  Made patient aware that nurse will look at her feet but if symptoms get worst, recommend her to go to Urgent care to be checked out.

## 2023-01-17 ENCOUNTER — Other Ambulatory Visit: Payer: Self-pay

## 2023-01-17 DIAGNOSIS — E038 Other specified hypothyroidism: Secondary | ICD-10-CM

## 2023-01-17 DIAGNOSIS — I10 Essential (primary) hypertension: Secondary | ICD-10-CM

## 2023-01-17 DIAGNOSIS — E782 Mixed hyperlipidemia: Secondary | ICD-10-CM

## 2023-01-19 ENCOUNTER — Encounter: Payer: Self-pay | Admitting: Nurse Practitioner

## 2023-01-19 ENCOUNTER — Ambulatory Visit (INDEPENDENT_AMBULATORY_CARE_PROVIDER_SITE_OTHER): Payer: 59 | Admitting: Nurse Practitioner

## 2023-01-19 VITALS — BP 140/80 | HR 75 | Temp 97.4°F | Resp 18 | Ht 64.0 in | Wt 162.0 lb

## 2023-01-19 DIAGNOSIS — I10 Essential (primary) hypertension: Secondary | ICD-10-CM | POA: Diagnosis not present

## 2023-01-19 DIAGNOSIS — B353 Tinea pedis: Secondary | ICD-10-CM | POA: Insufficient documentation

## 2023-01-19 DIAGNOSIS — B369 Superficial mycosis, unspecified: Secondary | ICD-10-CM | POA: Insufficient documentation

## 2023-01-19 DIAGNOSIS — E782 Mixed hyperlipidemia: Secondary | ICD-10-CM | POA: Diagnosis not present

## 2023-01-19 DIAGNOSIS — E038 Other specified hypothyroidism: Secondary | ICD-10-CM

## 2023-01-19 MED ORDER — CLOTRIMAZOLE-BETAMETHASONE 1-0.05 % EX CREA: 1.0000 | TOPICAL_CREAM | Freq: Every day | CUTANEOUS | 2 refills | Status: DC

## 2023-01-19 NOTE — Assessment & Plan Note (Addendum)
Continue taking your medicine Synthroid 112mg OD

## 2023-01-19 NOTE — Assessment & Plan Note (Signed)
Apply the antifungal cream as instructed  Apply Cetaphil or Aquaphor for dryness Call if you have a  You have a fever. You have swelling, pain, warmth, or redness in your foot. Your feet are not getting better with treatment. Your symptoms get worse. You have new symptoms. You have very bad pain.

## 2023-01-19 NOTE — Patient Instructions (Addendum)
Please use Aquaphor or Ceptaphil to prevent dryness  Athlete's Foot  Athlete's foot (tinea pedis) is a fungal infection of the skin on your feet. It often occurs on the skin that is between or underneath your toes. It can also occur on the soles of your feet. Symptoms include itchy or white and flaky areas on the skin. The infection can spread from person to person (is contagious). It can also spread when a person's bare feet come in contact with the fungus on shower floors or on items such as shoes. Follow these instructions at home: Medicines Apply or take over-the-counter and prescription medicines only as told by your doctor. Apply your antifungal medicine as told by your doctor. Do not stop using it even if your feet start to get better. Foot care Do not scratch your feet. Keep your feet dry: Wear cotton or wool socks. Change your socks every day or if they become wet. Wear shoes that allow air to move around, such as sandals or canvas tennis shoes. Wash and dry your feet: Every day or as told by your doctor. After exercising. Including the area between your toes. General instructions Do not share any of these items that touch your feet: Towels. Shoes. Nail clippers. Other personal items. Protect your feet by wearing sandals in wet areas, such as locker rooms and shared showers. Keep all follow-up visits. If you have diabetes, keep your blood sugar under control. Contact a doctor if: You have a fever. You have swelling, pain, warmth, or redness in your foot. Your feet are not getting better with treatment. Your symptoms get worse. You have new symptoms. You have very bad pain. Summary Athlete's foot is a fungal infection of the skin on your feet. This condition is caused by a fungus that grows in warm, moist places. Symptoms include itchy or white and flaky areas on the skin. Apply your antifungal medicine as told by your doctor. Keep your feet clean and dry. This  information is not intended to replace advice given to you by your health care provider. Make sure you discuss any questions you have with your health care provider. Document Revised: 03/24/2021 Document Reviewed: 03/24/2021 Elsevier Patient Education  [place].

## 2023-01-19 NOTE — Assessment & Plan Note (Addendum)
Continue taking diltiazem daily.   Nutrition: Stressed importance of moderation in sodium intake, saturated fat and cholesterol, caloric balance, sufficient intake of complex carbohydrates, fiber, calcium and iron.   Exercise: Stressed the importance of regular exercise.

## 2023-01-19 NOTE — Progress Notes (Signed)
Subjective:  Patient ID: Sydney Ayala, female    DOB: June 20, 1946  Age: 77 y.o. MRN: [id number]  Chief Complaints: Feet and toes red and itchy  History of Present illness:  Pt states that her feet and toes are red and itchy since past 2 weeks. She does not remember changing any detergents or exposing to anything. She mentioned  she was exposed to the [employer] many years ago but not recently. She is using OTC anti itch cream without any significant effect. She changed her socks to cotton from nylon recently. Her feet are dry and cracking and feels uneasy while walking. She wants to get recovered soon and wondering how long will it take to recover.  Current Outpatient Medications on File Prior to Visit  Medication Sig Dispense Refill   acetaminophen (TYLENOL) 500 MG tablet Take 1,000 mg by mouth every 8 (eight) hours as needed for mild pain.     BD SYRINGE SLIP TIP 25G X 5/8" 1 ML MISC USE AS DIRECTED - ONCE A MONTH 4 each 3   cyanocobalamin (,VITAMIN B-12,) 1000 MCG/ML injection INJECT 1 ML MONTHLY 3 mL 3   diltiazem (TIAZAC) 120 MG 24 hr capsule Take 1 capsule by mouth daily.     hydrOXYzine (VISTARIL) 25 MG capsule Take 1 capsule (25 mg total) by mouth every 8 (eight) hours as needed. 30 capsule 0   levothyroxine (SYNTHROID) 137 MCG tablet Take 1 tablet (137 mcg total) by mouth daily. 90 tablet 0   Multiple Vitamins-Minerals (50+ ADULT EYE HEALTH PO) Take by mouth.     Multiple Vitamins-Minerals ([employer]S SPECTRAVITE ADULT 50+) TABS Take 1 tablet by mouth daily.     naproxen (NAPROSYN) 500 MG tablet TAKE 1 TABLET BY MOUTH EVERY DAY WITH FOOD 30 tablet 6   PARoxetine (PAXIL) 10 MG tablet TAKE 1 TABLET BY MOUTH EVERY DAY 90 tablet 2   potassium chloride SA (K-DUR,KLOR-CON) 20 MEQ tablet Take 1 tablet (20 mEq total) by mouth once. (Patient taking differently: Take 20 mEq by mouth.) 30 tablet 3   Probiotic Product (DIGESTIVE ADVANTAGE PO) Take 1 each by mouth every other day.     Syringe/Needle,  Disp, (SYRINGE 3CC/22GX1") 22G X 1" 3 ML MISC 1 Syringe by Does not apply route once. Please dispense syringe/needle for b12 injections. Pt will self administer her own injections. 50 each 4   No current facility-administered medications on file prior to visit.   Past Medical History:  Diagnosis Date   Chronic venous insufficiency    Dysrhythmia    A Fib during hospital stay 04/03/16   History of rheumatic fever CHILDHOOD   Hypothyroidism    Injury of tendon of long head of right biceps 2018   Pinched nerve in neck    Past Surgical History:  Procedure Laterality Date   ABDOMINAL HYSTERECTOMY     CARDIOVASCULAR STRESS TEST  03/15/2008   NORMAL LVSF/ EF 61%/ NO ISCHEMIA   CATARACT EXTRACTION W/ INTRAOCULAR LENS IMPLANT & ANTERIOR VITRECTOMY, BILATERAL Bilateral 02/2021   COLONOSCOPY WITH PROPOFOL N/A 04/17/2016   Procedure: COLONOSCOPY WITH PROPOFOL;  Surgeon: Lucilla Lame, MD;  Location: [place];  Service: Endoscopy;  Laterality: N/A;   DILATION AND CURETTAGE OF UTERUS     ESOPHAGOGASTRODUODENOSCOPY (EGD) WITH PROPOFOL N/A 04/04/2016   Procedure: ESOPHAGOGASTRODUODENOSCOPY (EGD) WITH PROPOFOL;  Surgeon: Lucilla Lame, MD;  Location: [facility];  Service: Endoscopy;  Laterality: N/A;   GANGLION CYST EXCISION  06/09/2012   Procedure: REMOVAL GANGLION OF WRIST;  Surgeon: Magnus Sinning, MD;  Location: [facility];  Service: Orthopedics;  Laterality: Left;  EXCISION OF GANGLION CYST LEFT RING FINGER  IV REGIONAL ANESTHESIA   HEMRROIDECTOMY AND ANAL FISSURE REPAIR  12/16/1963   KNEE ARTHROSCOPY WITH MEDIAL MENISECTOMY Left 11/26/2017   Procedure: KNEE ARTHROSCOPY WITH MEDIAL MENISECTOMY;  Surgeon: Corky Mull, MD;  Location: [facility];  Service: Orthopedics;  Laterality: Left;   LAPAROSCOPIC CHOLECYSTECTOMY  05/13/2002   Gall Bladder   LEFT BREAST DUCTAL EXCISION  10/11/2009   BENIGN   LEFT HAND SURG.  08/16/1979   EXTENSIVE REPAIR AND REMOVAL OF  LIGAMENT/TENDON  (?CANCER)   LUMBAR LAMINECTOMY  1990  &  1976   RIGHT SIDE L4 - 5  & L5 - S1   POLYPECTOMY  04/17/2016   Procedure: POLYPECTOMY;  Surgeon: Lucilla Lame, MD;  Location: [place];  Service: Endoscopy;;   REMOVAL RIGHT GOIN MASS  08/16/1979   BENIGN   REMOVAL TOE NAILBED  AGE 62   ALL 10 TOES-- DEFORMED   RIGHT BREAST LUMPECTOMY     BENIGN   RIGHT FOOT SURG  X3   REPAIR OF 2 TOES   ROTATOR CUFF REPAIR  04/11/2008   LEFT SHOULDER   SPINE SURGERY     THYROID GOITER REMOVED  08/15/1969   TONSILLECTOMY  CHILD   VAGINAL HYSTERECTOMY/ ANTERIOR & POSTERIOR REPAIR/ TRANSOBTURATOR SLING  04/25/2005   CYSTOCELE/ RECTOCELE/ UTERINE PROLAPSE/ SUI    Family History  Problem Relation Age of Onset   Deep vein thrombosis Mother        Right Leg   Heart disease Mother        Before age 88-  CHF   Varicose Veins Mother    Heart attack Mother    Heart disease Father        Before age 6-  PVD   Heart attack Father    Arthritis Father        Gout   Hypertension Father    Cancer Sister        Thyroid-Back   Cancer Sister        Breast and Lung   Heart disease Sister    Heart disease Sister    Other Sister        platelet disorder   Colon cancer Maternal Grandmother    Breast cancer Maternal Grandmother    Heart attack Other        father's side multiple family members   Stroke Other        father's side multiple family members   Colon cancer Maternal Uncle    Diabetes Maternal Uncle    Diabetes Maternal Aunt    Social History   Socioeconomic History   Marital status: Divorced    Spouse name: Not on file   Number of children: 3   Years of education: Not on file   Highest education level: 8th grade  Occupational History   Not on file  Tobacco Use   Smoking status: Former    Years: 40.00    Types: Cigarettes    Quit date: 1993    Years since quitting: 31.1   Smokeless tobacco: Never  Vaping Use   Vaping Use: Never used  Substance and Sexual  Activity   Alcohol use: Never   Drug use: Never   Sexual activity: Not Currently  Other Topics Concern   Not on file  Social History Narrative   Lives alone   Retired  Caffeine: coffee 3 cups daily   Social Determinants of Health   Financial Resource Strain: Not on file  Food Insecurity: Not on file  Transportation Needs: Not on file  Physical Activity: Not on file  Stress: Not on file  Social Connections: Not on file    Review of Systems  Constitutional:  Negative for chills, diaphoresis, fatigue and fever.  HENT:  Negative for congestion, ear pain and sore throat.   Respiratory:  Negative for cough and shortness of breath.   Cardiovascular:  Negative for chest pain and palpitations.  Gastrointestinal:  Negative for abdominal pain, constipation, diarrhea, nausea and vomiting.  Endocrine: Negative for polydipsia, polyphagia and polyuria.  Genitourinary:  Negative for dysuria and frequency.  Musculoskeletal:  Negative for arthralgias, back pain and myalgias.  Skin:  Positive for rash.       Bilateral foot rash, redness, excoriation from itching  Neurological:  Negative for dizziness and headaches.  Psychiatric/Behavioral:  Negative for dysphoric mood. The patient is not nervous/anxious.      Objective:  BP (!) 140/80   Pulse 75   Temp (!) 97.4 F (36.3 C)   Resp 18   Ht '5\' 4"'$  (1.626 m)   Wt 162 lb (73.5 kg)   SpO2 97%   BMI 27.81 kg/m      01/19/2023   11:59 AM 09/25/2022    1:38 PM 03/18/2022   10:53 AM  BP/Weight  Systolic BP 450 388 828  Diastolic BP 80 74 70  Wt. (Lbs) 162 162 164  BMI 27.81 kg/m2 26.96 kg/m2 27.29 kg/m2    Physical Exam Vitals reviewed.  Constitutional:      Appearance: Normal appearance.  Neck:     Vascular: No carotid bruit.  Cardiovascular:     Rate and Rhythm: Normal rate and regular rhythm.     Heart sounds: Normal heart sounds.  Pulmonary:     Effort: Pulmonary effort is normal.     Breath sounds: Normal breath sounds.   Abdominal:     General: Bowel sounds are normal.     Palpations: Abdomen is soft.     Tenderness: There is no abdominal tenderness.  Musculoskeletal:        General: No swelling, tenderness or deformity.     Right lower leg: No edema.     Left lower leg: No edema.  Skin:    Coloration: Skin is not jaundiced or pale.     Findings: Erythema and rash present. No bruising or lesion.     Comments: Bilateral foot fungal rash   Neurological:     Mental Status: She is alert and oriented to person, place, and time.  Psychiatric:        Mood and Affect: Mood normal.        Behavior: Behavior normal.        03/18/2022   10:59 AM 11/05/2020   10:18 AM 03/01/2020   11:01 AM  Depression screen PHQ 2/9  Decreased Interest 0 0 0  Down, Depressed, Hopeless 0 0 0  PHQ - 2 Score 0 0 0  Altered sleeping  0   Tired, decreased energy  0   Change in appetite  0   Feeling bad or failure about yourself   0   Trouble concentrating  0   Moving slowly or fidgety/restless  0   Suicidal thoughts  0   PHQ-9 Score  0        08/11/2016   10:59 AM 03/01/2020  11:01 AM 05/28/2021   10:27 AM  Fall Risk  Falls in the past year? No 1 0  Was there an injury with Fall?  0 0  Fall Risk Category Calculator  2 0  Fall Risk Category (Retired)  Moderate Low  (RETIRED) Patient Fall Risk Level  Moderate fall risk Moderate fall risk  Patient at Risk for Falls Due to  Impaired balance/gait Impaired balance/gait  Fall risk Follow up  Falls evaluation completed     Lab Results  Component Value Date   WBC 5.5 03/18/2022   HGB 13.6 03/18/2022   HCT 40.5 03/18/2022   PLT 224 03/18/2022   GLUCOSE 86 03/18/2022   CHOL 189 03/18/2022   TRIG 72 03/18/2022   HDL 67 03/18/2022   LDLCALC 109 (H) 03/18/2022   ALT 16 03/18/2022   AST 29 03/18/2022   NA 143 03/18/2022   K 5.0 03/18/2022   CL 104 03/18/2022   CREATININE 0.88 03/18/2022   BUN 21 03/18/2022   CO2 24 03/18/2022   TSH 1.570 04/30/2022   INR 1.16  04/03/2016      Assessment & Plan:   Tinea pedis of both feet Assessment & Plan: Apply the antifungal cream as instructed  Apply Cetaphil or Aquaphor for dryness Call if you have a  You have a fever. You have swelling, pain, warmth, or redness in your foot. Your feet are not getting better with treatment. Your symptoms get worse. You have new symptoms. You have very bad pain.  Orders: -     Clotrimazole-Betamethasone; Apply 1 Application topically daily.  Dispense: 30 g; Refill: 2  Other specified hypothyroidism Assessment & Plan: Continue taking your medicine Synthroid 139mg OD  Orders: -     TSH  Essential hypertension Assessment & Plan: Continue taking diltiazem daily.   Nutrition: Stressed importance of moderation in sodium intake, saturated fat and cholesterol, caloric balance, sufficient intake of complex carbohydrates, fiber, calcium and iron.   Exercise: Stressed the importance of regular exercise.     Orders: -     CBC with Differential/Platelet -     Comprehensive metabolic panel  Mixed hyperlipidemia Assessment & Plan: Lab will be drawn today  Nutrition: Stressed importance of moderation in sodium intake, saturated fat and cholesterol, caloric balance, sufficient intake of complex carbohydrates, fiber, calcium and iron.   Exercise: Stressed the importance of regular exercise.     Orders: -     Lipid panel    Follow-up: Return if symptoms worsen or fail to improve. I, RNeil Crouchhave reviewed all documentation for this visit. The documentation on 01/19/23   for the exam, diagnosis, procedures, and orders are all accurate and complete.     An After Visit Summary was printed and given to the patient.  RNeil Crouch DNP, [place]([phone]

## 2023-01-19 NOTE — Assessment & Plan Note (Signed)
Lab will be drawn today  Nutrition: Stressed importance of moderation in sodium intake, saturated fat and cholesterol, caloric balance, sufficient intake of complex carbohydrates, fiber, calcium and iron.   Exercise: Stressed the importance of regular exercise.

## 2023-01-20 LAB — CBC WITH DIFFERENTIAL/PLATELET
Basophils Absolute: 0 10*3/uL (ref 0.0–0.2)
Basos: 1 %
EOS (ABSOLUTE): 0.2 10*3/uL (ref 0.0–0.4)
Eos: 4 %
Hematocrit: 42.6 % (ref 34.0–46.6)
Hemoglobin: 14 g/dL (ref 11.1–15.9)
Immature Grans (Abs): 0 10*3/uL (ref 0.0–0.1)
Immature Granulocytes: 0 %
Lymphocytes Absolute: 1.9 10*3/uL (ref 0.7–3.1)
Lymphs: 36 %
MCH: 30.6 pg (ref 26.6–33.0)
MCHC: 32.9 g/dL (ref 31.5–35.7)
MCV: 93 fL (ref 79–97)
Monocytes Absolute: 0.5 10*3/uL (ref 0.1–0.9)
Monocytes: 9 %
Neutrophils Absolute: 2.6 10*3/uL (ref 1.4–7.0)
Neutrophils: 50 %
Platelets: 246 10*3/uL (ref 150–450)
RBC: 4.58 x10E6/uL (ref 3.77–5.28)
RDW: 13.2 % (ref 11.7–15.4)
WBC: 5.2 10*3/uL (ref 3.4–10.8)

## 2023-01-20 LAB — COMPREHENSIVE METABOLIC PANEL
ALT: 14 IU/L (ref 0–32)
AST: 18 IU/L (ref 0–40)
Albumin/Globulin Ratio: 1.9 (ref 1.2–2.2)
Albumin: 4.7 g/dL (ref 3.8–4.8)
Alkaline Phosphatase: 96 IU/L (ref 44–121)
BUN/Creatinine Ratio: 21 (ref 12–28)
BUN: 20 mg/dL (ref 8–27)
Bilirubin Total: 0.4 mg/dL (ref 0.0–1.2)
CO2: 26 mmol/L (ref 20–29)
Calcium: 10 mg/dL (ref 8.7–10.3)
Chloride: 102 mmol/L (ref 96–106)
Creatinine, Ser: 0.96 mg/dL (ref 0.57–1.00)
Globulin, Total: 2.5 g/dL (ref 1.5–4.5)
Glucose: 74 mg/dL (ref 70–99)
Potassium: 4.2 mmol/L (ref 3.5–5.2)
Sodium: 142 mmol/L (ref 134–144)
Total Protein: 7.2 g/dL (ref 6.0–8.5)
eGFR: 62 mL/min/{1.73_m2} (ref >59)

## 2023-01-20 LAB — LIPID PANEL
Chol/HDL Ratio: 2.9 ratio (ref 0.0–4.4)
Cholesterol, Total: 209 mg/dL — ABNORMAL HIGH (ref 100–199)
HDL: 71 mg/dL (ref >39)
LDL Chol Calc (NIH): 120 mg/dL — ABNORMAL HIGH (ref 0–99)
Triglycerides: 100 mg/dL (ref 0–149)
VLDL Cholesterol Cal: 18 mg/dL (ref 5–40)

## 2023-01-20 LAB — TSH: TSH: 4.45 u[IU]/mL (ref 0.450–4.500)

## 2023-01-20 LAB — CARDIOVASCULAR RISK ASSESSMENT

## 2023-01-21 ENCOUNTER — Other Ambulatory Visit: Payer: Self-pay | Admitting: Nurse Practitioner

## 2023-01-21 DIAGNOSIS — E782 Mixed hyperlipidemia: Secondary | ICD-10-CM

## 2023-01-21 MED ORDER — ROSUVASTATIN CALCIUM 5 MG PO TABS: 5.0000 mg | ORAL_TABLET | Freq: Every day | ORAL | 1 refills | Status: DC

## 2023-01-22 ENCOUNTER — Ambulatory Visit (INDEPENDENT_AMBULATORY_CARE_PROVIDER_SITE_OTHER): Payer: 59 | Admitting: Family Medicine

## 2023-01-22 ENCOUNTER — Encounter: Payer: Self-pay | Admitting: Family Medicine

## 2023-01-22 VITALS — BP 128/82 | HR 89 | Temp 97.0°F | Ht 62.0 in | Wt 164.0 lb

## 2023-01-22 DIAGNOSIS — Z23 Encounter for immunization: Secondary | ICD-10-CM

## 2023-01-22 DIAGNOSIS — M5412 Radiculopathy, cervical region: Secondary | ICD-10-CM

## 2023-01-22 DIAGNOSIS — E782 Mixed hyperlipidemia: Secondary | ICD-10-CM | POA: Diagnosis not present

## 2023-01-22 DIAGNOSIS — Z1231 Encounter for screening mammogram for malignant neoplasm of breast: Secondary | ICD-10-CM

## 2023-01-22 DIAGNOSIS — E038 Other specified hypothyroidism: Secondary | ICD-10-CM

## 2023-01-22 DIAGNOSIS — I10 Essential (primary) hypertension: Secondary | ICD-10-CM | POA: Diagnosis not present

## 2023-01-22 DIAGNOSIS — Z1211 Encounter for screening for malignant neoplasm of colon: Secondary | ICD-10-CM

## 2023-01-22 DIAGNOSIS — I4891 Unspecified atrial fibrillation: Secondary | ICD-10-CM

## 2023-01-22 MED ORDER — ACETAMINOPHEN ER 650 MG PO TBCR: 650.0000 mg | EXTENDED_RELEASE_TABLET | Freq: Three times a day (TID) | ORAL | 0 refills | Status: AC | PRN

## 2023-01-22 MED ORDER — SYNTHROID 125 MCG PO TABS: 125.0000 ug | ORAL_TABLET | Freq: Every day | ORAL | 3 refills | Status: DC

## 2023-01-22 NOTE — Progress Notes (Signed)
Subjective:  Patient ID: Sydney Ayala, female    DOB: [birth year]  Age: 77 y.o. MRN: [id number]  Chief Complaint  Patient presents with   Hypothyroidism   Hyperlipidemia   Hypertension    HPI Hypothyroidism: synthroid 137 mcg daily  Vertigo: taking paxil 10 mg daily and is helping. BPPV exercises did not help, but paxil has helped.   Atrial Fibrillation: Diltiazem 120 mg daily. Patient saw Dr. Ubaldo Glassing.   Hyperlipidemia: Not on current medications  TC 209, Trig 100, HDL 71 and LDL 120. Has always controlled with diet and exercise.  Patient going to adjust her diet. Prefers this to starting crestor.   B12 deficiency: On b12 shots once monthly.   Red feet: seen last week. Given lotrisone cream, but has not started it.      01/22/2023    2:41 PM 03/18/2022   10:59 AM 11/05/2020   10:18 AM 03/01/2020   11:01 AM  Depression screen PHQ 2/9  Decreased Interest 0 0 0 0  Down, Depressed, Hopeless 0 0 0 0  PHQ - 2 Score 0 0 0 0  Altered sleeping   0   Tired, decreased energy   0   Change in appetite   0   Feeling bad or failure about yourself    0   Trouble concentrating   0   Moving slowly or fidgety/restless   0   Suicidal thoughts   0   PHQ-9 Score   0          08/11/2016   10:59 AM 03/01/2020   11:01 AM 05/28/2021   10:27 AM 01/22/2023    2:41 PM  Fall Risk  Falls in the past year? No 1 0 0  Was there an injury with Fall?  0 0 0  Fall Risk Category Calculator  2 0 0  Fall Risk Category (Retired)  Moderate Low   (RETIRED) Patient Fall Risk Level  Moderate fall risk Moderate fall risk   Patient at Risk for Falls Due to  Impaired balance/gait Impaired balance/gait No Fall Risks  Fall risk Follow up  Falls evaluation completed  Falls evaluation completed      Review of Systems  Constitutional:  Negative for chills, fatigue and fever.  HENT:  Negative for congestion, ear pain, rhinorrhea and sore throat.   Respiratory:  Negative for cough and shortness of breath.    Cardiovascular:  Negative for chest pain.  Gastrointestinal:  Negative for abdominal pain, constipation, diarrhea, nausea and vomiting.  Genitourinary:  Negative for dysuria and urgency.  Musculoskeletal:  Positive for neck pain. Negative for back pain and myalgias.  Neurological:  Negative for dizziness, weakness, light-headedness and headaches.  Psychiatric/Behavioral:  Negative for dysphoric mood. The patient is not nervous/anxious.     Current Outpatient Medications on File Prior to Visit  Medication Sig Dispense Refill   BD SYRINGE SLIP TIP 25G X 5/8" 1 ML MISC USE AS DIRECTED - ONCE A MONTH 4 each 3   clotrimazole-betamethasone (LOTRISONE) cream Apply 1 Application topically daily. 30 g 2   cyanocobalamin (,VITAMIN B-12,) 1000 MCG/ML injection INJECT 1 ML MONTHLY 3 mL 3   diltiazem (TIAZAC) 120 MG 24 hr capsule Take 1 capsule by mouth daily.     Multiple Vitamins-Minerals (50+ ADULT EYE HEALTH PO) Take by mouth.     Multiple Vitamins-Minerals ([employer]S SPECTRAVITE ADULT 50+) TABS Take 1 tablet by mouth daily.     naproxen (NAPROSYN) 500 MG tablet TAKE 1 TABLET BY  MOUTH EVERY DAY WITH FOOD 30 tablet 6   PARoxetine (PAXIL) 10 MG tablet TAKE 1 TABLET BY MOUTH EVERY DAY 90 tablet 2   potassium chloride SA (K-DUR,KLOR-CON) 20 MEQ tablet Take 1 tablet (20 mEq total) by mouth once. (Patient taking differently: Take 20 mEq by mouth.) 30 tablet 3   Probiotic Product (DIGESTIVE ADVANTAGE PO) Take 1 each by mouth every other day.     Syringe/Needle, Disp, (SYRINGE 3CC/22GX1") 22G X 1" 3 ML MISC 1 Syringe by Does not apply route once. Please dispense syringe/needle for b12 injections. Pt will self administer her own injections. 50 each 4   No current facility-administered medications on file prior to visit.   Past Medical History:  Diagnosis Date   Chronic venous insufficiency    Dysrhythmia    A Fib during hospital stay 04/03/16   History of rheumatic fever CHILDHOOD   Hypothyroidism    Injury  of tendon of long head of right biceps 2018   Pinched nerve in neck    Past Surgical History:  Procedure Laterality Date   ABDOMINAL HYSTERECTOMY     CARDIOVASCULAR STRESS TEST  03/15/2008   NORMAL LVSF/ EF 61%/ NO ISCHEMIA   CATARACT EXTRACTION W/ INTRAOCULAR LENS IMPLANT & ANTERIOR VITRECTOMY, BILATERAL Bilateral 02/2021   COLONOSCOPY WITH PROPOFOL N/A 04/17/2016   Procedure: COLONOSCOPY WITH PROPOFOL;  Surgeon: Lucilla Lame, MD;  Location: [place];  Service: Endoscopy;  Laterality: N/A;   DILATION AND CURETTAGE OF UTERUS     ESOPHAGOGASTRODUODENOSCOPY (EGD) WITH PROPOFOL N/A 04/04/2016   Procedure: ESOPHAGOGASTRODUODENOSCOPY (EGD) WITH PROPOFOL;  Surgeon: Lucilla Lame, MD;  Location: [facility];  Service: Endoscopy;  Laterality: N/A;   GANGLION CYST EXCISION  06/09/2012   Procedure: REMOVAL GANGLION OF WRIST;  Surgeon: Magnus Sinning, MD;  Location: [place];  Service: Orthopedics;  Laterality: Left;  EXCISION OF GANGLION CYST LEFT RING FINGER  IV REGIONAL ANESTHESIA   HEMRROIDECTOMY AND ANAL FISSURE REPAIR  12/16/1963   KNEE ARTHROSCOPY WITH MEDIAL MENISECTOMY Left 11/26/2017   Procedure: KNEE ARTHROSCOPY WITH MEDIAL MENISECTOMY;  Surgeon: Corky Mull, MD;  Location: [facility];  Service: Orthopedics;  Laterality: Left;   LAPAROSCOPIC CHOLECYSTECTOMY  05/13/2002   Gall Bladder   LEFT BREAST DUCTAL EXCISION  10/11/2009   BENIGN   LEFT HAND SURG.  08/16/1979   EXTENSIVE REPAIR AND REMOVAL OF LIGAMENT/TENDON  (?CANCER)   LUMBAR LAMINECTOMY  1990  &  1976   RIGHT SIDE L4 - 5  & L5 - S1   POLYPECTOMY  04/17/2016   Procedure: POLYPECTOMY;  Surgeon: Lucilla Lame, MD;  Location: [place];  Service: Endoscopy;;   REMOVAL RIGHT GOIN MASS  08/16/1979   BENIGN   REMOVAL TOE NAILBED  AGE 43   ALL 10 TOES-- DEFORMED   RIGHT BREAST LUMPECTOMY     BENIGN   RIGHT FOOT SURG  X3   REPAIR OF 2 TOES   ROTATOR CUFF REPAIR  04/11/2008   LEFT SHOULDER    SPINE SURGERY     THYROID GOITER REMOVED  08/15/1969   TONSILLECTOMY  CHILD   VAGINAL HYSTERECTOMY/ ANTERIOR & POSTERIOR REPAIR/ TRANSOBTURATOR SLING  04/25/2005   CYSTOCELE/ RECTOCELE/ UTERINE PROLAPSE/ SUI    Family History  Problem Relation Age of Onset   Deep vein thrombosis Mother        Right Leg   Heart disease Mother        Before age 86-  CHF   Varicose Veins Mother  Heart attack Mother    Heart disease Father        Before age 73-  PVD   Heart attack Father    Arthritis Father        Gout   Hypertension Father    Cancer Sister        Thyroid-Back   Cancer Sister        Breast and Lung   Heart disease Sister    Heart disease Sister    Other Sister        platelet disorder   Cancer Maternal Grandmother        Breast/Lung Cancer   Colon cancer Maternal Grandmother    Breast cancer Maternal Grandmother    Diabetes Maternal Aunt    Colon cancer Maternal Uncle    Diabetes Maternal Uncle    Heart attack Other        father's side multiple family members   Stroke Other        father's side multiple family members   Social History   Socioeconomic History   Marital status: Divorced    Spouse name: Not on file   Number of children: 3   Years of education: Not on file   Highest education level: 8th grade  Occupational History   Not on file  Tobacco Use   Smoking status: Former    Years: 40.00    Types: Cigarettes    Quit date: 1993    Years since quitting: 31.1   Smokeless tobacco: Never  Vaping Use   Vaping Use: Never used  Substance and Sexual Activity   Alcohol use: Never   Drug use: Never   Sexual activity: Not Currently  Other Topics Concern   Not on file  Social History Narrative   Lives alone   Retired   Caffeine: coffee 3 cups daily   Social Determinants of Health   Financial Resource Strain: Low Risk  (01/22/2023)   Overall Financial Resource Strain (CARDIA)    Difficulty of Paying Living Expenses: Not hard at all  Food Insecurity:  No Food Insecurity (01/22/2023)   Hunger Vital Sign    Worried About Running Out of Food in the Last Year: Never true    Ran Out of Food in the Last Year: Never true  Transportation Needs: No Transportation Needs (01/22/2023)   PRAPARE - Hydrologist (Medical): No    Lack of Transportation (Non-Medical): No  Physical Activity: Sufficiently Active (01/22/2023)   Exercise Vital Sign    Days of Exercise per Week: 4 days    Minutes of Exercise per Session: 40 min  Stress: No Stress Concern Present (01/22/2023)   [place]    Feeling of Stress : Not at all  Social Connections: Moderately Isolated (01/22/2023)   Social Connection and Isolation Panel [NHANES]    Frequency of Communication with Friends and Family: More than three times a week    Frequency of Social Gatherings with Friends and Family: More than three times a week    Attends Religious Services: 1 to 4 times per year    Active Member of [employer] or Organizations: No    Attends Music therapist: Never    Marital Status: Divorced    Objective:  BP 128/82   Pulse 89   Temp (!) 97 F (36.1 C)   Ht 5' 2"$  (1.575 m)   Wt 164 lb (74.4 kg)   SpO2  97%   BMI 30.00 kg/m      01/22/2023    2:41 PM 01/19/2023   11:59 AM 09/25/2022    1:38 PM  BP/Weight  Systolic BP 0000000 [id number]  Diastolic BP 82 80 74  Wt. (Lbs) 164 162 162  BMI 30 kg/m2 27.81 kg/m2 26.96 kg/m2    Physical Exam Vitals reviewed.  Constitutional:      Appearance: Normal appearance. She is normal weight.  Neck:     Vascular: No carotid bruit.  Cardiovascular:     Rate and Rhythm: Normal rate and regular rhythm.     Heart sounds: Normal heart sounds.  Pulmonary:     Effort: Pulmonary effort is normal. No respiratory distress.     Breath sounds: Normal breath sounds.  Abdominal:     General: Abdomen is flat. Bowel sounds are normal.     Palpations: Abdomen is  soft.     Tenderness: There is no abdominal tenderness.  Musculoskeletal:       Feet:  Neurological:     Mental Status: She is alert and oriented to person, place, and time.  Psychiatric:        Mood and Affect: Mood normal.        Behavior: Behavior normal.     Diabetic Foot Exam - Simple   No data filed      Lab Results  Component Value Date   WBC 5.2 01/19/2023   HGB 14.0 01/19/2023   HCT 42.6 01/19/2023   PLT 246 01/19/2023   GLUCOSE 74 01/19/2023   CHOL 209 (H) 01/19/2023   TRIG 100 01/19/2023   HDL 71 01/19/2023   LDLCALC 120 (H) 01/19/2023   ALT 14 01/19/2023   AST 18 01/19/2023   NA 142 01/19/2023   K 4.2 01/19/2023   CL 102 01/19/2023   CREATININE 0.96 01/19/2023   BUN 20 01/19/2023   CO2 26 01/19/2023   TSH 4.450 01/19/2023   INR 1.16 04/03/2016      Assessment & Plan:    Mixed hyperlipidemia Assessment & Plan: Recommend continue to work on eating healthy diet and exercise. Patient refused medicine at this time.    Essential hypertension Assessment & Plan: Continue diltiazem 120 mg daily.    Other specified hypothyroidism Assessment & Plan: Previously well controlled Continue Synthroid at current dose  Recheck TSH and adjust Synthroid as indicated    Orders: -     Synthroid; Take 1 tablet (125 mcg total) by mouth daily before breakfast.  Dispense: 90 tablet; Refill: 3  Screening mammogram for breast cancer -     Digital Screening Mammogram, Left and Right; Future  Encounter for screening colonoscopy -     Ambulatory referral to Gastroenterology  Cervical radiculopathy -     Acetaminophen ER; Take 1 tablet (650 mg total) by mouth every 8 (eight) hours as needed for pain.  Dispense: 90 tablet; Refill: 0 -     DG Cervical Spine Complete; Future  Need for vaccination -     Pneumococcal conjugate vaccine 20-valent     Meds ordered this encounter  Medications   acetaminophen (ACETAMINOPHEN 8 HOUR) 650 MG CR tablet    Sig: Take 1  tablet (650 mg total) by mouth every 8 (eight) hours as needed for pain.    Dispense:  90 tablet    Refill:  0   SYNTHROID 125 MCG tablet    Sig: Take 1 tablet (125 mcg total) by mouth daily before breakfast.  Dispense:  90 tablet    Refill:  3    Orders Placed This Encounter  Procedures   MM DIGITAL SCREENING BILATERAL   DG Cervical Spine Complete   Pneumococcal conjugate vaccine 20-valent   Ambulatory referral to Gastroenterology     Follow-up: Return in about 6 months (around 07/23/2023) for chronic fasting.  An After Visit Summary was printed and given to the patient.  Rochel Brome, MD [facility] [phone]

## 2023-01-24 NOTE — Assessment & Plan Note (Addendum)
Previously well controlled Continue Synthroid at current dose  Recheck TSH and adjust Synthroid as indicated   

## 2023-01-24 NOTE — Assessment & Plan Note (Signed)
Recommend continue to work on eating healthy diet and exercise. Patient refused medicine at this time.

## 2023-01-24 NOTE — Assessment & Plan Note (Signed)
Continue diltiazem 120 mg daily.

## 2023-01-26 ENCOUNTER — Telehealth: Payer: Self-pay

## 2023-01-26 NOTE — Telephone Encounter (Signed)
Gastroenterology Pre-Procedure Review Patient to call back to schedule. Request Date: TBD Requesting Physician: Dr. Allen Norris  PATIENT REVIEW QUESTIONS: The patient responded to the following health history questions as indicated:    1. Are you having any GI issues? no 2. Do you have a personal history of Polyps? yes (history of colon polyps last colonoscopy performed by Dr. Allen Norris 04/17/16) 3. Do you have a family history of Colon Cancer or Polyps? yes (maternal grandfather,  maternal uncle colon cancer) 4. Diabetes Mellitus? no 5. Joint replacements in the past 12 months?no 6. Major health problems in the past 3 months?no 7. Any artificial heart valves, MVP, or defibrillator?no    MEDICATIONS & ALLERGIES:    Patient reports the following regarding taking any anticoagulation/antiplatelet therapy:   Plavix, Coumadin, Eliquis, Xarelto, Lovenox, Pradaxa, Brilinta, or Effient? no.  Pt does have cardiac history will need clearance but is in transition with [facility] Cardiologist. She said they are changing her cardiologist.  Will re-discuss when she calls back to schedule. Aspirin? no  Patient confirms/reports the following medications:  Current Outpatient Medications  Medication Sig Dispense Refill   acetaminophen (ACETAMINOPHEN 8 HOUR) 650 MG CR tablet Take 1 tablet (650 mg total) by mouth every 8 (eight) hours as needed for pain. 90 tablet 0   BD SYRINGE SLIP TIP 25G X 5/8" 1 ML MISC USE AS DIRECTED - ONCE A MONTH 4 each 3   clotrimazole-betamethasone (LOTRISONE) cream Apply 1 Application topically daily. 30 g 2   cyanocobalamin (,VITAMIN B-12,) 1000 MCG/ML injection INJECT 1 ML MONTHLY 3 mL 3   diltiazem (TIAZAC) 120 MG 24 hr capsule Take 1 capsule by mouth daily.     Multiple Vitamins-Minerals (50+ ADULT EYE HEALTH PO) Take by mouth.     Multiple Vitamins-Minerals (CVS SPECTRAVITE ADULT 50+) TABS Take 1 tablet by mouth daily.     naproxen (NAPROSYN) 500 MG tablet TAKE 1 TABLET BY MOUTH EVERY DAY  WITH FOOD 30 tablet 6   PARoxetine (PAXIL) 10 MG tablet TAKE 1 TABLET BY MOUTH EVERY DAY 90 tablet 2   potassium chloride SA (K-DUR,KLOR-CON) 20 MEQ tablet Take 1 tablet (20 mEq total) by mouth once. (Patient taking differently: Take 20 mEq by mouth.) 30 tablet 3   Probiotic Product (DIGESTIVE ADVANTAGE PO) Take 1 each by mouth every other day.     SYNTHROID 125 MCG tablet Take 1 tablet (125 mcg total) by mouth daily before breakfast. 90 tablet 3   Syringe/Needle, Disp, (SYRINGE 3CC/22GX1") 22G X 1" 3 ML MISC 1 Syringe by Does not apply route once. Please dispense syringe/needle for b12 injections. Pt will self administer her own injections. 50 each 4   No current facility-administered medications for this visit.    Patient confirms/reports the following allergies:  Allergies  Allergen Reactions   Latex Rash    Pt denies.  Reports allergy to adhesives, not latex   Codeine Nausea And Vomiting   Contrast Media [Iodinated Contrast Media] Nausea And Vomiting and Other (See Comments)    "CONVULSIONS"   Indomethacin Nausea And Vomiting and Other (See Comments)    "CONVULSIONS"   Adhesive [Tape] Rash and Other (See Comments)    BURNS SKIN    No orders of the defined types were placed in this encounter.   AUTHORIZATION INFORMATION Primary Insurance: 1D#: Group #:  Secondary Insurance: 1D#: Group #:  SCHEDULE INFORMATION: Date: TBD Time: Location: [place]

## 2023-02-10 ENCOUNTER — Other Ambulatory Visit: Payer: Self-pay

## 2023-02-10 ENCOUNTER — Telehealth: Payer: Self-pay

## 2023-02-10 DIAGNOSIS — M5412 Radiculopathy, cervical region: Secondary | ICD-10-CM

## 2023-02-10 NOTE — Telephone Encounter (Signed)
Patient has requested to schedule her colonoscopy, however she will need cardiac clearance prior to scheduling.  She has been seen by [facility] Cardiology but states that she is in transition to see Dr. Clayborn Bigness but had been seen by Dr. Ubaldo Glassing and Dr. Nehemiah Massed.  Informed her that I will send cardiac clearance over first then once I get the cardiac clearance I will call her back to schedule her colonoscopy with Dr. Allen Norris at [facility].  Thanks,  [place], [place]

## 2023-02-16 ENCOUNTER — Ambulatory Visit: Payer: Medicare Other

## 2023-02-23 ENCOUNTER — Telehealth: Payer: Self-pay

## 2023-02-23 ENCOUNTER — Other Ambulatory Visit: Payer: Self-pay | Admitting: Internal Medicine

## 2023-02-23 DIAGNOSIS — R911 Solitary pulmonary nodule: Secondary | ICD-10-CM

## 2023-02-23 DIAGNOSIS — D518 Other vitamin B12 deficiency anemias: Secondary | ICD-10-CM

## 2023-02-23 DIAGNOSIS — R17 Unspecified jaundice: Secondary | ICD-10-CM

## 2023-02-23 NOTE — Telephone Encounter (Signed)
Patient contacted office requesting to postpone scheduling her colonoscopy due to car problems, and other medical appts.  She said she will call back maybe sometime in June.  Thanks, [place], [place]

## 2023-02-26 ENCOUNTER — Ambulatory Visit
Admission: RE | Admit: 2023-02-26 | Discharge: 2023-02-26 | Disposition: A | Discharge status: Home / Self Care | Payer: 59 | Source: Ambulatory Visit | Attending: Family Medicine | Admitting: Family Medicine

## 2023-02-26 DIAGNOSIS — M5412 Radiculopathy, cervical region: Secondary | ICD-10-CM

## 2023-03-05 ENCOUNTER — Other Ambulatory Visit: Payer: Self-pay | Admitting: Internal Medicine

## 2023-03-05 DIAGNOSIS — R911 Solitary pulmonary nodule: Secondary | ICD-10-CM

## 2023-03-05 DIAGNOSIS — D518 Other vitamin B12 deficiency anemias: Secondary | ICD-10-CM

## 2023-03-05 DIAGNOSIS — R17 Unspecified jaundice: Secondary | ICD-10-CM

## 2023-03-13 ENCOUNTER — Other Ambulatory Visit: Payer: Self-pay | Admitting: Internal Medicine

## 2023-03-13 DIAGNOSIS — D518 Other vitamin B12 deficiency anemias: Secondary | ICD-10-CM

## 2023-03-13 DIAGNOSIS — R911 Solitary pulmonary nodule: Secondary | ICD-10-CM

## 2023-03-13 DIAGNOSIS — R17 Unspecified jaundice: Secondary | ICD-10-CM

## 2023-03-22 ENCOUNTER — Other Ambulatory Visit: Payer: Self-pay | Admitting: Family Medicine

## 2023-03-22 DIAGNOSIS — E038 Other specified hypothyroidism: Secondary | ICD-10-CM

## 2023-05-29 ENCOUNTER — Other Ambulatory Visit: Payer: Self-pay

## 2023-05-29 MED ORDER — PAROXETINE HCL 10 MG PO TABS: 10.0000 mg | ORAL_TABLET | Freq: Every day | ORAL | 0 refills | Status: DC

## 2023-06-16 ENCOUNTER — Ambulatory Visit (INDEPENDENT_AMBULATORY_CARE_PROVIDER_SITE_OTHER): Payer: 59

## 2023-06-16 VITALS — Wt 160.0 lb

## 2023-06-16 DIAGNOSIS — Z0000 Encounter for general adult medical examination without abnormal findings: Secondary | ICD-10-CM | POA: Diagnosis not present

## 2023-06-16 NOTE — Progress Notes (Signed)
Subjective:   Sydney Ayala is a 77 y.o. female who presents for Medicare Annual (Subsequent) preventive examination.  Visit Complete: Virtual  I connected with  Ashlie L Deans on 06/16/23 by a audio enabled telemedicine application and verified that I am speaking with the correct person using two identifiers.  Patient Location: Home  Provider Location: Office/Clinic  I discussed the limitations of evaluation and management by telemedicine. The patient expressed understanding and agreed to proceed.        Objective:    Today's Vitals   06/16/23 1521  Weight: 160 lb (72.6 kg)   Body mass index is 29.26 kg/m.     07/07/2019   11:04 AM 01/03/2019   10:21 AM 11/26/2017   11:58 AM 11/20/2017   10:52 AM 11/16/2017    8:10 AM 05/21/2016   11:11 AM 05/21/2016   11:10 AM  Advanced Directives  Does Patient Have a Medical Advance Directive? No No No No No No No  Would patient like information on creating a medical advance directive? Yes (MAU/Ambulatory/Procedural Areas - Information given) No - Patient declined No - Patient declined No - Patient declined No - Patient declined Yes - Educational materials given No - patient declined information    Current Medications (verified) Outpatient Encounter Medications as of 06/16/2023  Medication Sig   acetaminophen (ACETAMINOPHEN 8 HOUR) 650 MG CR tablet Take 1 tablet (650 mg total) by mouth every 8 (eight) hours as needed for pain.   BD SYRINGE SLIP TIP 25G X 5/8" 1 ML MISC USE AS DIRECTED - ONCE A MONTH   cyanocobalamin (VITAMIN B12) 1000 MCG/ML injection INJECT 1 ML MONTHLY   diltiazem (TIAZAC) 120 MG 24 hr capsule Take 1 capsule by mouth daily.   Multiple Vitamins-Minerals (50+ ADULT EYE HEALTH PO) Take by mouth.   Multiple Vitamins-Minerals (CVS SPECTRAVITE ADULT 50+) TABS Take 1 tablet by mouth daily.   naproxen (NAPROSYN) 500 MG tablet TAKE 1 TABLET BY MOUTH EVERY DAY WITH FOOD   PARoxetine (PAXIL) 10 MG tablet Take 1 tablet (10 mg  total) by mouth daily.   potassium chloride SA (K-DUR,KLOR-CON) 20 MEQ tablet Take 1 tablet (20 mEq total) by mouth once. (Patient taking differently: Take 20 mEq by mouth.)   Probiotic Product (DIGESTIVE ADVANTAGE PO) Take 1 each by mouth every other day.   SYNTHROID 125 MCG tablet Take 1 tablet (125 mcg total) by mouth daily before breakfast.   Syringe/Needle, Disp, (SYRINGE 3CC/22GX1") 22G X 1" 3 ML MISC 1 Syringe by Does not apply route once. Please dispense syringe/needle for b12 injections. Pt will self administer her own injections.   clotrimazole-betamethasone (LOTRISONE) cream Apply 1 Application topically daily. (Patient not taking: Reported on 06/16/2023)   No facility-administered encounter medications on file as of 06/16/2023.    Allergies (verified) Latex, Codeine, Contrast media [iodinated contrast media], Indomethacin, and Adhesive [tape]   History: Past Medical History:  Diagnosis Date   Chronic venous insufficiency    Dysrhythmia    A Fib during hospital stay 04/03/16   History of rheumatic fever CHILDHOOD   Hypothyroidism    Injury of tendon of long head of right biceps 2018   Pinched nerve in neck    Past Surgical History:  Procedure Laterality Date   ABDOMINAL HYSTERECTOMY     CARDIOVASCULAR STRESS TEST  03/15/2008   NORMAL LVSF/ EF 61%/ NO ISCHEMIA   CATARACT EXTRACTION W/ INTRAOCULAR LENS IMPLANT & ANTERIOR VITRECTOMY, BILATERAL Bilateral 02/2021   COLONOSCOPY WITH PROPOFOL N/A  04/17/2016   Procedure: COLONOSCOPY WITH PROPOFOL;  Surgeon: Midge Minium, MD;  Location: [facility];  Service: Endoscopy;  Laterality: N/A;   DILATION AND CURETTAGE OF UTERUS     ESOPHAGOGASTRODUODENOSCOPY (EGD) WITH PROPOFOL N/A 04/04/2016   Procedure: ESOPHAGOGASTRODUODENOSCOPY (EGD) WITH PROPOFOL;  Surgeon: Midge Minium, MD;  Location: [facility];  Service: Endoscopy;  Laterality: N/A;   GANGLION CYST EXCISION  06/09/2012   Procedure: REMOVAL GANGLION OF WRIST;  Surgeon:  Drucilla Schmidt, MD;  Location: [facility];  Service: Orthopedics;  Laterality: Left;  EXCISION OF GANGLION CYST LEFT RING FINGER  IV REGIONAL ANESTHESIA   HEMRROIDECTOMY AND ANAL FISSURE REPAIR  12/16/1963   KNEE ARTHROSCOPY WITH MEDIAL MENISECTOMY Left 11/26/2017   Procedure: KNEE ARTHROSCOPY WITH MEDIAL MENISECTOMY;  Surgeon: Christena Flake, MD;  Location: [facility];  Service: Orthopedics;  Laterality: Left;   LAPAROSCOPIC CHOLECYSTECTOMY  05/13/2002   Gall Bladder   LEFT BREAST DUCTAL EXCISION  10/11/2009   BENIGN   LEFT HAND SURG.  08/16/1979   EXTENSIVE REPAIR AND REMOVAL OF LIGAMENT/TENDON  (?CANCER)   LUMBAR LAMINECTOMY  1990  &  1976   RIGHT SIDE L4 - 5  & L5 - S1   POLYPECTOMY  04/17/2016   Procedure: POLYPECTOMY;  Surgeon: Midge Minium, MD;  Location: [facility];  Service: Endoscopy;;   REMOVAL RIGHT GOIN MASS  08/16/1979   BENIGN   REMOVAL TOE NAILBED  AGE 14   ALL 10 TOES-- DEFORMED   RIGHT BREAST LUMPECTOMY     BENIGN   RIGHT FOOT SURG  X3   REPAIR OF 2 TOES   ROTATOR CUFF REPAIR  04/11/2008   LEFT SHOULDER   SPINE SURGERY     THYROID GOITER REMOVED  08/15/1969   TONSILLECTOMY  CHILD   VAGINAL HYSTERECTOMY/ ANTERIOR & POSTERIOR REPAIR/ TRANSOBTURATOR SLING  04/25/2005   CYSTOCELE/ RECTOCELE/ UTERINE PROLAPSE/ SUI   Family History  Problem Relation Age of Onset   Deep vein thrombosis Mother        Right Leg   Heart disease Mother        Before age 65-  CHF   Varicose Veins Mother    Heart attack Mother    Heart disease Father        Before age 28-  PVD   Heart attack Father    Arthritis Father        Gout   Hypertension Father    Cancer Sister        Thyroid-Back   Cancer Sister        Breast and Lung   Heart disease Sister    Heart disease Sister    Other Sister        platelet disorder   Cancer Maternal Grandmother        Breast/Lung Cancer   Colon cancer Maternal Grandmother    Breast cancer Maternal Grandmother     Diabetes Maternal Aunt    Colon cancer Maternal Uncle    Diabetes Maternal Uncle    Heart attack Other        father's side multiple family members   Stroke Other        father's side multiple family members   Social History   Socioeconomic History   Marital status: Divorced    Spouse name: Not on file   Number of children: 3   Years of education: Not on file   Highest education level: 8th grade  Occupational History  Not on file  Tobacco Use   Smoking status: Former    Years: 40    Types: Cigarettes    Quit date: 1993    Years since quitting: 31.5   Smokeless tobacco: Never  Vaping Use   Vaping Use: Never used  Substance and Sexual Activity   Alcohol use: Never   Drug use: Never   Sexual activity: Not Currently  Other Topics Concern   Not on file  Social History Narrative   Lives alone   Retired   Caffeine: coffee 3 cups daily   Social Determinants of Health   Financial Resource Strain: Low Risk  (01/22/2023)   Overall Financial Resource Strain (CARDIA)    Difficulty of Paying Living Expenses: Not hard at all  Food Insecurity: No Food Insecurity (01/22/2023)   Hunger Vital Sign    Worried About Running Out of Food in the Last Year: Never true    Ran Out of Food in the Last Year: Never true  Transportation Needs: No Transportation Needs (01/22/2023)   PRAPARE - Administrator, Civil Service (Medical): No    Lack of Transportation (Non-Medical): No  Physical Activity: Sufficiently Active (01/22/2023)   Exercise Vital Sign    Days of Exercise per Week: 4 days    Minutes of Exercise per Session: 40 min  Stress: No Stress Concern Present (01/22/2023)   [employer] of Occupational Health - Occupational Stress Questionnaire    Feeling of Stress : Not at all  Social Connections: Moderately Isolated (01/22/2023)   Social Connection and Isolation Panel [NHANES]    Frequency of Communication with Friends and Family: More than three times a week    Frequency of  Social Gatherings with Friends and Family: More than three times a week    Attends Religious Services: 1 to 4 times per year    Active Member of [employer] or Organizations: No    Attends Engineer, structural: Never    Marital Status: Divorced    Tobacco Counseling Counseling given: Not Answered   Clinical Intake:  Pre-visit preparation completed: Yes Pain : No/denies pain   BMI - recorded: 26.46 Nutritional Status: BMI 25 -29 Overweight Nutritional Risks: None Diabetes: No How often do you need to have someone help you when you read instructions, pamphlets, or other written materials from your doctor or pharmacy?: 2 - Rarely Interpreter Needed?: No     Activities of Daily Living    06/16/2023    2:26 PM 01/22/2023    2:42 PM  In your present state of health, do you have any difficulty performing the following activities:  Hearing? 0 0  Vision? 0 0  Difficulty concentrating or making decisions? 0 0  Walking or climbing stairs? 0 0  Dressing or bathing? 0 0  Doing errands, shopping? 0 0  Preparing Food and eating ? N   Using the Toilet? N   In the past six months, have you accidently leaked urine? N   Do you have problems with loss of bowel control? N   Managing your Medications? N   Managing your Finances? N   Housekeeping or managing your Housekeeping? N     Patient Care Team: Blane Ohara, MD as PCP - General (Family Medicine) Alwyn Pea, MD as Consulting Physician (Cardiology)     Assessment:   This is a routine wellness examination for Amyrah.  Hearing/Vision screen No results found.  Dietary issues and exercise activities discussed: Aim for 30 minutes of  exercise or brisk walking, 6-8 glasses of water, and 5 servings of fruits and vegetables each day.    Depression Screen    06/16/2023    2:34 PM 01/22/2023    2:41 PM 03/18/2022   10:59 AM 11/05/2020   10:18 AM 03/01/2020   11:01 AM  PHQ 2/9 Scores  PHQ - 2 Score 0 0 0 0 0  PHQ- 9 Score    0      Fall Risk    06/16/2023    2:25 PM 01/22/2023    2:41 PM 05/28/2021   10:27 AM 03/01/2020   11:01 AM 08/11/2016   10:59 AM  Fall Risk   Falls in the past year? 1 0 0 1 No  Comment     Emmi Telephone Survey: data to providers prior to load  Number falls in past yr: 1 0 0 1   Injury with Fall? 0 0 0 0   Risk for fall due to :  No Fall Risks Impaired balance/gait Impaired balance/gait   Follow up  Falls evaluation completed  Falls evaluation completed     MEDICARE RISK AT HOME:  Medicare Risk at Home - 06/16/23 1426     Any stairs in or around the home? Yes    If so, are there any without handrails? No    Home free of loose throw rugs in walkways, pet beds, electrical cords, etc? Yes    Adequate lighting in your home to reduce risk of falls? Yes    Life alert? No    Use of a cane, walker or w/c? No    Grab bars in the bathroom? No    Shower chair or bench in shower? No    Elevated toilet seat or a handicapped toilet? No             TIMED UP AND GO:  Was the test performed?  No    Cognitive Function:        06/16/2023    3:23 PM  6CIT Screen  What Year? 0 points  What month? 0 points  What time? 0 points  Count back from 20 0 points  Months in reverse 2 points  Repeat phrase 4 points  Total Score 6 points    Immunizations Immunization History  Administered Date(s) Administered   Influenza, High Dose Seasonal PF 12/25/2017, 10/11/2018   Influenza-Unspecified 12/25/2022   PNEUMOCOCCAL CONJUGATE-20 01/22/2023   Pneumococcal Conjugate-13 11/22/2018    TDAP status: Due, Education has been provided regarding the importance of this vaccine. Advised may receive this vaccine at local pharmacy or Health Dept. Aware to provide a copy of the vaccination record if obtained from local pharmacy or Health Dept. Verbalized acceptance and understanding.  Flu Vaccine status: Up to date  Pneumococcal vaccine status: Up to date  Covid-19 vaccine status: Declined, Education  has been provided regarding the importance of this vaccine but patient still declined. Advised may receive this vaccine at local pharmacy or Health Dept.or vaccine clinic. Aware to provide a copy of the vaccination record if obtained from local pharmacy or Health Dept. Verbalized acceptance and understanding.  Qualifies for Shingles Vaccine? Yes   Zostavax completed No   Shingrix Completed?: No.    Education has been provided regarding the importance of this vaccine. Patient has been advised to call insurance company to determine out of pocket expense if they have not yet received this vaccine. Advised may also receive vaccine at local pharmacy or Health Dept. Verbalized acceptance  and understanding.  Screening Tests Health Maintenance  Topic Date Due   COVID-19 Vaccine (1) Never done   DTaP/Tdap/Td (1 - Tdap) Never done   Zoster Vaccines- Shingrix (1 of 2) Never done   Medicare Annual Wellness (AWV)  07/12/2015   MAMMOGRAM  01/15/2019   Colonoscopy  04/17/2021   INFLUENZA VACCINE  07/16/2023   DEXA SCAN  03/27/2024   Pneumonia Vaccine 51+ Years old  Completed   Hepatitis C Screening  Completed   HPV VACCINES  Aged Out    Health Maintenance  Health Maintenance Due  Topic Date Due   COVID-19 Vaccine (1) Never done   DTaP/Tdap/Td (1 - Tdap) Never done   Zoster Vaccines- Shingrix (1 of 2) Never done   Medicare Annual Wellness (AWV)  07/12/2015   MAMMOGRAM  01/15/2019   Colonoscopy  04/17/2021    Colorectal cancer screening: Type of screening: Colonoscopy. Completed 2017. Repeat every 5 years  Mammogram status: Ordered previously. Pt provided with contact info and advised to call to schedule appt.   Bone Density status: Completed 03/2022. Results reflect: Bone density results: OSTEOPENIA. Repeat every 2 years.  Lung Cancer Screening: (Low Dose CT Chest recommended if Age 53-80 years, 20 pack-year currently smoking OR have quit w/in 15years.) does not qualify.   Lung Cancer  Screening Referral: N/A  Additional Screening:  Vision Screening: Recommended annual ophthalmology exams for early detection of glaucoma and other disorders of the eye. Is the patient up to date with their annual eye exam?  Yes  Who is the provider or what is the name of the office in which the patient attends annual eye exams? [facility]  Dental Screening: Recommended annual dental exams for proper oral hygiene   Community Resource Referral / Chronic Care Management: CRR required this visit?  No   CCM required this visit?  No     Plan:    1- Patient agreed to get tetanus and shingles vaccine at the pharmacy 2- Patient will call to schedule colonoscopy (positive family history of colon cancer)  I have personally reviewed and noted the following in the patient's chart:   Medical and social history Use of alcohol, tobacco or illicit drugs  Current medications and supplements including opioid prescriptions.  Functional ability and status Nutritional status Physical activity Advanced directives List of other physicians Hospitalizations, surgeries, and ER visits in previous 12 months Vitals Screenings to include cognitive, depression, and falls Referrals and appointments  In addition, I have reviewed and discussed with patient certain preventive protocols, quality metrics, and best practice recommendations. A written personalized care plan for preventive services as well as general preventive health recommendations were provided to patient.     Jacklynn Bue, LPN   4/0/9811   After Visit Summary: (Mail) Due to this being a telephonic visit, the after visit summary with patients personalized plan was offered to patient via mail

## 2023-06-16 NOTE — Patient Instructions (Signed)
Ms. Rockholt , Thank you for taking time to come for your Medicare Wellness Visit. I appreciate your ongoing commitment to your health goals. Please review the following plan we discussed and let me know if I can assist you in the future.     This is a list of the screening recommended for you and due dates:  Health Maintenance  Topic Date Due   COVID-19 Vaccine (1) Never done   DTaP/Tdap/Td vaccine (1 - Tdap) Never done   Zoster (Shingles) Vaccine (1 of 2) Never done   Mammogram  01/15/2019   Colon Cancer Screening  04/17/2021   Flu Shot  07/16/2023   DEXA scan (bone density measurement)  03/27/2024   Medicare Annual Wellness Visit  06/15/2024   Pneumonia Vaccine  Completed   Hepatitis C Screening  Completed   HPV Vaccine  Aged Out   Get your tetanus and shingrix vaccines at the pharmacy.  Don't forget to call to schedule your mammogram and colonoscopy   Preventive Care 65 Years and Older, Female Preventive care refers to lifestyle choices and visits with your health care provider that can promote health and wellness. What does preventive care include? A yearly physical exam. This is also called an annual well check. Dental exams once or twice a year. Routine eye exams. Ask your health care provider how often you should have your eyes checked. Personal lifestyle choices, including: Daily care of your teeth and gums. Regular physical activity. Eating a healthy diet. Avoiding tobacco and drug use. Limiting alcohol use. Practicing safe sex. Taking low-dose aspirin every day. Taking vitamin and mineral supplements as recommended by your health care provider. What happens during an annual well check? The services and screenings done by your health care provider during your annual well check will depend on your age, overall health, lifestyle risk factors, and family history of disease. Counseling  Your health care provider may ask you questions about your: Alcohol use. Tobacco  use. Drug use. Emotional well-being. Home and relationship well-being. Sexual activity. Eating habits. History of falls. Memory and ability to understand (cognition). Work and work Astronomer. Reproductive health. Screening  You may have the following tests or measurements: Height, weight, and BMI. Blood pressure. Lipid and cholesterol levels. These may be checked every 5 years, or more frequently if you are over 31 years old. Skin check. Lung cancer screening. You may have this screening every year starting at age 85 if you have a 30-pack-year history of smoking and currently smoke or have quit within the past 15 years. Fecal occult blood test (FOBT) of the stool. You may have this test every year starting at age 58. Flexible sigmoidoscopy or colonoscopy. You may have a sigmoidoscopy every 5 years or a colonoscopy every 10 years starting at age 76. Hepatitis C blood test. Hepatitis B blood test. Sexually transmitted disease (STD) testing. Diabetes screening. This is done by checking your blood sugar (glucose) after you have not eaten for a while (fasting). You may have this done every 1-3 years. Bone density scan. This is done to screen for osteoporosis. You may have this done starting at age 86. Mammogram. This may be done every 1-2 years. Talk to your health care provider about how often you should have regular mammograms. Talk with your health care provider about your test results, treatment options, and if necessary, the need for more tests. Vaccines  Your health care provider may recommend certain vaccines, such as: Influenza vaccine. This is recommended every year. Tetanus,  diphtheria, and acellular pertussis (Tdap, Td) vaccine. You may need a Td booster every 10 years. Zoster vaccine. You may need this after age 21. Pneumococcal 13-valent conjugate (PCV13) vaccine. One dose is recommended after age 62. Pneumococcal polysaccharide (PPSV23) vaccine. One dose is recommended  after age 79. Talk to your health care provider about which screenings and vaccines you need and how often you need them. This information is not intended to replace advice given to you by your health care provider. Make sure you discuss any questions you have with your health care provider. Document Released: 12/28/2015 Document Revised: 08/20/2016 Document Reviewed: 10/02/2015 Elsevier Interactive Patient Education  2017 [place] Prevention in the Home Falls can cause injuries. They can happen to people of all ages. There are many things you can do to make your home safe and to help prevent falls. What can I do on the outside of my home? Regularly fix the edges of walkways and driveways and fix any cracks. Remove anything that might make you trip as you walk through a door, such as a raised step or threshold. Trim any bushes or trees on the path to your home. Use bright outdoor lighting. Clear any walking paths of anything that might make someone trip, such as rocks or tools. Regularly check to see if handrails are loose or broken. Make sure that both sides of any steps have handrails. Any raised decks and porches should have guardrails on the edges. Have any leaves, snow, or ice cleared regularly. Use sand or salt on walking paths during winter. Clean up any spills in your garage right away. This includes oil or grease spills. What can I do in the bathroom? Use night lights. Install grab bars by the toilet and in the tub and shower. Do not use towel bars as grab bars. Use non-skid mats or decals in the tub or shower. If you need to sit down in the shower, use a plastic, non-slip stool. Keep the floor dry. Clean up any water that spills on the floor as soon as it happens. Remove soap buildup in the tub or shower regularly. Attach bath mats securely with double-sided non-slip rug tape. Do not have throw rugs and other things on the floor that can make you trip. What can I do  in the bedroom? Use night lights. Make sure that you have a light by your bed that is easy to reach. Do not use any sheets or blankets that are too big for your bed. They should not hang down onto the floor. Have a firm chair that has side arms. You can use this for support while you get dressed. Do not have throw rugs and other things on the floor that can make you trip. What can I do in the kitchen? Clean up any spills right away. Avoid walking on wet floors. Keep items that you use a lot in easy-to-reach places. If you need to reach something above you, use a strong step stool that has a grab bar. Keep electrical cords out of the way. Do not use floor polish or wax that makes floors slippery. If you must use wax, use non-skid floor wax. Do not have throw rugs and other things on the floor that can make you trip. What can I do with my stairs? Do not leave any items on the stairs. Make sure that there are handrails on both sides of the stairs and use them. Fix handrails that are broken or loose. Make  sure that handrails are as long as the stairways. Check any carpeting to make sure that it is firmly attached to the stairs. Fix any carpet that is loose or worn. Avoid having throw rugs at the top or bottom of the stairs. If you do have throw rugs, attach them to the floor with carpet tape. Make sure that you have a light switch at the top of the stairs and the bottom of the stairs. If you do not have them, ask someone to add them for you. What else can I do to help prevent falls? Wear shoes that: Do not have high heels. Have rubber bottoms. Are comfortable and fit you well. Are closed at the toe. Do not wear sandals. If you use a stepladder: Make sure that it is fully opened. Do not climb a closed stepladder. Make sure that both sides of the stepladder are locked into place. Ask someone to hold it for you, if possible. Clearly mark and make sure that you can see: Any grab bars or  handrails. First and last steps. Where the edge of each step is. Use tools that help you move around (mobility aids) if they are needed. These include: Canes. Walkers. Scooters. Crutches. Turn on the lights when you go into a dark area. Replace any light bulbs as soon as they burn out. Set up your furniture so you have a clear path. Avoid moving your furniture around. If any of your floors are uneven, fix them. If there are any pets around you, be aware of where they are. Review your medicines with your doctor. Some medicines can make you feel dizzy. This can increase your chance of falling. Ask your doctor what other things that you can do to help prevent falls. This information is not intended to replace advice given to you by your health care provider. Make sure you discuss any questions you have with your health care provider. Document Released: 09/27/2009 Document Revised: 05/08/2016 Document Reviewed: 01/05/2015 Elsevier Interactive Patient Education  2017 [employer].

## 2023-07-07 ENCOUNTER — Other Ambulatory Visit (INDEPENDENT_AMBULATORY_CARE_PROVIDER_SITE_OTHER): Payer: Self-pay | Admitting: Internal Medicine

## 2023-07-07 DIAGNOSIS — R200 Anesthesia of skin: Secondary | ICD-10-CM

## 2023-07-23 ENCOUNTER — Ambulatory Visit: Payer: 59 | Admitting: Family Medicine

## 2023-08-17 ENCOUNTER — Other Ambulatory Visit: Payer: Self-pay | Admitting: Family Medicine

## 2023-09-15 ENCOUNTER — Other Ambulatory Visit: Payer: Self-pay | Admitting: Internal Medicine

## 2023-09-28 ENCOUNTER — Telehealth: Payer: Self-pay | Admitting: *Deleted

## 2023-09-28 MED ORDER — "BD SYRINGE SLIP TIP 25G X 5/8"" 1 ML MISC": 3 refills | Status: AC

## 2023-09-28 NOTE — Telephone Encounter (Signed)
Syringes ordered and call to patient to inform of this and left message on voice mail

## 2023-09-28 NOTE — Telephone Encounter (Signed)
Patient states the needles ordered for her B12 injection is the wrong size, she needs the 1 ml syringe with 25G 5/8" needles

## 2023-09-30 ENCOUNTER — Other Ambulatory Visit: Payer: Self-pay

## 2023-09-30 ENCOUNTER — Telehealth: Payer: Self-pay

## 2023-09-30 DIAGNOSIS — Z860100 Personal history of colon polyps, unspecified: Secondary | ICD-10-CM

## 2023-09-30 MED ORDER — NA SULFATE-K SULFATE-MG SULF 17.5-3.13-1.6 GM/177ML PO SOLN
1.0000 | Freq: Once | ORAL | 0 refills | Status: AC
Start: 2023-09-30 — End: 2023-09-30

## 2023-09-30 NOTE — Telephone Encounter (Signed)
Gastroenterology Pre-Procedure Review    Gastroenterology Pre-Procedure Review  Request Date: 10/29/23 Requesting Physician: Dr. Servando Snare   PATIENT REVIEW QUESTIONS: The patient responded to the following health history questions as indicated:     1. Are you having any GI issues? no 2. Do you have a personal history of Polyps? yes (history of colon polyps last colonoscopy performed by Dr. Servando Snare 04/17/16) 3. Do you have a family history of Colon Cancer or Polyps? yes (maternal grandfather,  maternal uncle colon cancer) 4. Diabetes Mellitus? no 5. Joint replacements in the past 12 months?no 6. Major health problems in the past 3 months?no 7. Any artificial heart valves, MVP, or defibrillator?no 8. Cardiac history? Yes clearance sent to Dr. Roslynn Amble office.    MEDICATIONS & ALLERGIES:    Patient reports the following regarding taking any anticoagulation/antiplatelet therapy:   Plavix, Coumadin, Eliquis, Xarelto, Lovenox, Pradaxa, Brilinta, or Effient? no.  Pt does have cardiac history will need clearance but is in transition with [facility] Cardiologist. She said they are changing her cardiologist.  Will re-discuss when she calls back to schedule. Aspirin? no    Patient confirms/reports the following medications:  Current Outpatient Medications  Medication Sig Dispense Refill   acetaminophen (ACETAMINOPHEN 8 HOUR) 650 MG CR tablet Take 1 tablet (650 mg total) by mouth every 8 (eight) hours as needed for pain. 90 tablet 0   clotrimazole-betamethasone (LOTRISONE) cream Apply 1 Application topically daily. (Patient not taking: Reported on 06/16/2023) 30 g 2   cyanocobalamin (VITAMIN B12) 1000 MCG/ML injection INJECT 1 ML MONTHLY 3 mL 3   diltiazem (TIAZAC) 120 MG 24 hr capsule Take 1 capsule by mouth daily.     Multiple Vitamins-Minerals (50+ ADULT EYE HEALTH PO) Take by mouth.     Multiple Vitamins-Minerals ([employer] SPECTRAVITE ADULT 50+) TABS Take 1 tablet by mouth daily.     naproxen (NAPROSYN) 500 MG  tablet TAKE 1 TABLET BY MOUTH EVERY DAY WITH FOOD 30 tablet 6   PARoxetine (PAXIL) 10 MG tablet Take 1 tablet (10 mg total) by mouth daily. 90 tablet 0   potassium chloride SA (K-DUR,KLOR-CON) 20 MEQ tablet Take 1 tablet (20 mEq total) by mouth once. (Patient taking differently: Take 20 mEq by mouth.) 30 tablet 3   Probiotic Product (DIGESTIVE ADVANTAGE PO) Take 1 each by mouth every other day.     SYNTHROID 125 MCG tablet Take 1 tablet (125 mcg total) by mouth daily before breakfast. 90 tablet 3   Syringe/Needle, Disp, (SYRINGE 3CC/22GX1") 22G X 1" 3 ML MISC 1 Syringe by Does not apply route once. Please dispense syringe/needle for b12 injections. Pt will self administer her own injections. 50 each 4   TUBERCULIN SYR 1CC/25GX5/8" (BD SYRINGE SLIP TIP) 25G X 5/8" 1 ML MISC To be used monthly for B 12 injection 4 each 3   No current facility-administered medications for this visit.    Patient confirms/reports the following allergies:  Allergies  Allergen Reactions   Latex Rash    Pt denies.  Reports allergy to adhesives, not latex   Codeine Nausea And Vomiting   Contrast Media [Iodinated Contrast Media] Nausea And Vomiting and Other (See Comments)    "CONVULSIONS"   Indomethacin Nausea And Vomiting and Other (See Comments)    "CONVULSIONS"   Adhesive [Tape] Rash and Other (See Comments)    BURNS SKIN    No orders of the defined types were placed in this encounter.   AUTHORIZATION INFORMATION Primary Insurance: 1D#: Group #:  Secondary Insurance:  1D#: Group #:  SCHEDULE INFORMATION: Date: 10/29/23 Time: Location: [facility]

## 2023-09-30 NOTE — Telephone Encounter (Signed)
Pt requesting call back to schedule colonoscopy.

## 2023-10-05 ENCOUNTER — Other Ambulatory Visit: Payer: Self-pay | Admitting: Physician Assistant

## 2023-10-22 ENCOUNTER — Encounter: Payer: Self-pay | Admitting: Gastroenterology

## 2023-10-22 ENCOUNTER — Telehealth: Payer: Self-pay

## 2023-10-22 NOTE — Telephone Encounter (Signed)
Cardiac clearance is still pending for  10/29/23 colonoscopy with Dr. Servando Snare.  It has been refaxed to Dr. Roslynn Amble office to Heather's attention.  Thanks,  [place], [place]

## 2023-10-26 NOTE — Telephone Encounter (Signed)
Inquire on status of cardiac clearance refaxed on 10/22/23.  Message was taken by receptionist who stated she would send message to [facility]  Thanks,  [place], [place]

## 2023-10-27 NOTE — Telephone Encounter (Signed)
Patient stated that Dr. Roslynn Amble office said they would be faxing the clearance to our office after noon today.  We did not receive it.  Contacted Dr. Roslynn Amble office to request that they send the clearance again.  Thanks,  [place], [place]

## 2023-10-27 NOTE — Telephone Encounter (Signed)
Contacted patient to let her know that I'm still waiting for clearance to received for her colonoscopy and if I'm unable to get it today-I will need to cancel her colonoscopy for 10/29/23.  She said she will call over there because she doesn't want her procedure to be canceled.  Thanks,  [place], [place]

## 2023-10-28 ENCOUNTER — Encounter: Payer: Self-pay | Admitting: Gastroenterology

## 2023-10-28 NOTE — Telephone Encounter (Signed)
Contacted [facility] to request a verbal order for clearance advice.  Spoke with Lequita Halt, LPN who stated that she has had issues getting fax to go through.   Lequita Halt provided a verbal clearance stating that "patient is cleared to have her colonoscopy".  Thanks,  Marcelino Duster CMA

## 2023-10-29 ENCOUNTER — Ambulatory Visit
Admission: RE | Admit: 2023-10-29 | Discharge: 2023-10-29 | Disposition: A | Discharge status: Home / Self Care | Payer: 59 | Attending: Gastroenterology | Admitting: Gastroenterology

## 2023-10-29 ENCOUNTER — Ambulatory Visit: Payer: 59 | Admitting: Anesthesiology

## 2023-10-29 ENCOUNTER — Other Ambulatory Visit: Payer: Self-pay

## 2023-10-29 ENCOUNTER — Encounter
Admission: RE | Disposition: A | Discharge status: Home / Self Care | Payer: Self-pay | Source: Home / Self Care | Attending: Gastroenterology

## 2023-10-29 DIAGNOSIS — Z1211 Encounter for screening for malignant neoplasm of colon: Secondary | ICD-10-CM | POA: Diagnosis present

## 2023-10-29 DIAGNOSIS — I10 Essential (primary) hypertension: Secondary | ICD-10-CM | POA: Diagnosis not present

## 2023-10-29 DIAGNOSIS — I4891 Unspecified atrial fibrillation: Secondary | ICD-10-CM | POA: Diagnosis not present

## 2023-10-29 DIAGNOSIS — K219 Gastro-esophageal reflux disease without esophagitis: Secondary | ICD-10-CM | POA: Insufficient documentation

## 2023-10-29 DIAGNOSIS — Z860101 Personal history of adenomatous and serrated colon polyps: Secondary | ICD-10-CM | POA: Diagnosis not present

## 2023-10-29 DIAGNOSIS — Z79890 Hormone replacement therapy (postmenopausal): Secondary | ICD-10-CM | POA: Insufficient documentation

## 2023-10-29 DIAGNOSIS — Z860100 Personal history of colon polyps, unspecified: Secondary | ICD-10-CM

## 2023-10-29 DIAGNOSIS — Z87891 Personal history of nicotine dependence: Secondary | ICD-10-CM | POA: Diagnosis not present

## 2023-10-29 DIAGNOSIS — E039 Hypothyroidism, unspecified: Secondary | ICD-10-CM | POA: Diagnosis not present

## 2023-10-29 DIAGNOSIS — K640 First degree hemorrhoids: Secondary | ICD-10-CM | POA: Insufficient documentation

## 2023-10-29 HISTORY — PX: COLONOSCOPY WITH PROPOFOL: SHX5780

## 2023-10-29 SURGERY — COLONOSCOPY WITH PROPOFOL
Anesthesia: General

## 2023-10-29 MED ORDER — EPHEDRINE SULFATE-NACL 50-0.9 MG/10ML-% IV SOSY
PREFILLED_SYRINGE | INTRAVENOUS | Status: DC | PRN
  Administered 2023-10-29: 10 mg via INTRAVENOUS

## 2023-10-29 MED ORDER — SODIUM CHLORIDE 0.9 % IV SOLN: INTRAVENOUS | Status: DC

## 2023-10-29 MED ORDER — LIDOCAINE HCL (CARDIAC) PF 100 MG/5ML IV SOSY
PREFILLED_SYRINGE | INTRAVENOUS | Status: DC | PRN
  Administered 2023-10-29: 60 mg via INTRAVENOUS

## 2023-10-29 MED ORDER — DEXMEDETOMIDINE HCL IN NACL 200 MCG/50ML IV SOLN
INTRAVENOUS | Status: DC | PRN
  Administered 2023-10-29: 20 ug via INTRAVENOUS

## 2023-10-29 MED ORDER — PROPOFOL 10 MG/ML IV BOLUS
INTRAVENOUS | Status: DC | PRN
  Administered 2023-10-29: 50 mg via INTRAVENOUS

## 2023-10-29 MED ORDER — PROPOFOL 500 MG/50ML IV EMUL
INTRAVENOUS | Status: DC | PRN
  Administered 2023-10-29: 100 ug/kg/min via INTRAVENOUS

## 2023-10-29 NOTE — Anesthesia Preprocedure Evaluation (Signed)
Anesthesia Evaluation  Patient identified by MRN, date of birth, ID band Patient awake    Reviewed: Allergy & Precautions, NPO status , Patient's Chart, lab work & pertinent test results  Airway Mallampati: II  TM Distance: >3 FB Neck ROM: full    Dental  (+) Missing, Poor Dentition, Dental Advisory Given   Pulmonary neg pulmonary ROS, former smoker   Pulmonary exam normal breath sounds clear to auscultation       Cardiovascular Exercise Tolerance: Good hypertension, Pt. on medications negative cardio ROS Normal cardiovascular exam+ dysrhythmias Atrial Fibrillation  Rhythm:Regular Rate:Normal     Neuro/Psych  Neuromuscular disease negative neurological ROS  negative psych ROS   GI/Hepatic negative GI ROS, Neg liver ROS,GERD  Medicated,,  Endo/Other  negative endocrine ROS    Renal/GU negative Renal ROS  negative genitourinary   Musculoskeletal  (+) Arthritis ,    Abdominal  (+) + obese  Peds negative pediatric ROS (+)  Hematology negative hematology ROS (+) Blood dyscrasia, anemia   Anesthesia Other Findings Past Medical History: No date: Chronic venous insufficiency No date: Dysrhythmia     Comment:  A Fib during hospital stay 04/03/16 CHILDHOOD: History of rheumatic fever No date: Hypothyroidism 2018: Injury of tendon of long head of right biceps No date: Pinched nerve in neck  Past Surgical History: No date: ABDOMINAL HYSTERECTOMY 03/15/2008: CARDIOVASCULAR STRESS TEST     Comment:  NORMAL LVSF/ EF 61%/ NO ISCHEMIA 02/2021: CATARACT EXTRACTION W/ INTRAOCULAR LENS IMPLANT & ANTERIOR  VITRECTOMY, BILATERAL; Bilateral 04/17/2016: COLONOSCOPY WITH PROPOFOL; N/A     Comment:  Procedure: COLONOSCOPY WITH PROPOFOL;  Surgeon: Midge Minium, MD;  Location: [facility];  Service:               Endoscopy;  Laterality: N/A; No date: DILATION AND CURETTAGE OF UTERUS 04/04/2016:  ESOPHAGOGASTRODUODENOSCOPY (EGD) WITH PROPOFOL; N/A     Comment:  Procedure: ESOPHAGOGASTRODUODENOSCOPY (EGD) WITH               PROPOFOL;  Surgeon: Midge Minium, MD;  Location: [facility]               ENDOSCOPY;  Service: Endoscopy;  Laterality: N/A; 06/09/2012: GANGLION CYST EXCISION     Comment:  Procedure: REMOVAL GANGLION OF WRIST;  Surgeon: Drucilla Schmidt, MD;  Location: [facility];                Service: Orthopedics;  Laterality: Left;  EXCISION OF               GANGLION CYST LEFT RING FINGER  IV REGIONAL ANESTHESIA 12/16/1963: HEMRROIDECTOMY AND ANAL FISSURE REPAIR 11/26/2017: KNEE ARTHROSCOPY WITH MEDIAL MENISECTOMY; Left     Comment:  Procedure: KNEE ARTHROSCOPY WITH MEDIAL MENISECTOMY;                Surgeon: Christena Flake, MD;  Location: [facility];                Service: Orthopedics;  Laterality: Left; 05/13/2002: LAPAROSCOPIC CHOLECYSTECTOMY     Comment:  Gall Bladder 10/11/2009: LEFT BREAST DUCTAL EXCISION     Comment:  BENIGN 08/16/1979: LEFT HAND SURG.     Comment:  EXTENSIVE REPAIR AND REMOVAL OF LIGAMENT/TENDON                (?CANCER) 1990  &  1976: LUMBAR LAMINECTOMY     Comment:  RIGHT SIDE L4 - 5  & L5 - S1 04/17/2016: POLYPECTOMY     Comment:  Procedure: POLYPECTOMY;  Surgeon: Midge Minium, MD;                Location: [facility];  Service: Endoscopy;; 08/16/1979: REMOVAL RIGHT GOIN MASS     Comment:  BENIGN AGE 77: REMOVAL TOE NAILBED     Comment:  ALL 10 TOES-- DEFORMED No date: RIGHT BREAST LUMPECTOMY     Comment:  BENIGN X3: RIGHT FOOT SURG     Comment:  REPAIR OF 2 TOES 04/11/2008: ROTATOR CUFF REPAIR     Comment:  LEFT SHOULDER No date: SPINE SURGERY 08/15/1969: THYROID GOITER REMOVED CHILD: TONSILLECTOMY 04/25/2005: VAGINAL HYSTERECTOMY/ ANTERIOR & POSTERIOR REPAIR/  TRANSOBTURATOR SLING     Comment:  CYSTOCELE/ RECTOCELE/ UTERINE PROLAPSE/ SUI  BMI    Body Mass Index: 28.17 kg/m       Reproductive/Obstetrics negative OB ROS                             Anesthesia Physical Anesthesia Plan  ASA: 3  Anesthesia Plan: General   Post-op Pain Management:    Induction: Intravenous  PONV Risk Score and Plan: Propofol infusion and TIVA  Airway Management Planned: Natural Airway and Nasal Cannula  Additional Equipment:   Intra-op Plan:   Post-operative Plan:   Informed Consent: I have reviewed the patients History and Physical, chart, labs and discussed the procedure including the risks, benefits and alternatives for the proposed anesthesia with the patient or authorized representative who has indicated his/her understanding and acceptance.     Dental Advisory Given  Plan Discussed with: CRNA and Surgeon  Anesthesia Plan Comments:        Anesthesia Quick Evaluation

## 2023-10-29 NOTE — H&P (Signed)
Midge Minium, MD [facility] [address] [place], [place] [address] Phone:[phone] Fax : [phone]  Primary Care Physician:  Blane Ohara, MD Primary Gastroenterologist:  Dr. Servando Snare  Pre-Procedure History & Physical: HPI:  Sydney Ayala is a 77 y.o. female is here for an colonoscopy.   Past Medical History:  Diagnosis Date   Chronic venous insufficiency    Dysrhythmia    A Fib during hospital stay 04/03/16   History of rheumatic fever CHILDHOOD   Hypothyroidism    Injury of tendon of long head of right biceps 2018   Pinched nerve in neck     Past Surgical History:  Procedure Laterality Date   ABDOMINAL HYSTERECTOMY     CARDIOVASCULAR STRESS TEST  03/15/2008   NORMAL LVSF/ EF 61%/ NO ISCHEMIA   CATARACT EXTRACTION W/ INTRAOCULAR LENS IMPLANT & ANTERIOR VITRECTOMY, BILATERAL Bilateral 02/2021   COLONOSCOPY WITH PROPOFOL N/A 04/17/2016   Procedure: COLONOSCOPY WITH PROPOFOL;  Surgeon: Midge Minium, MD;  Location: [facility];  Service: Endoscopy;  Laterality: N/A;   DILATION AND CURETTAGE OF UTERUS     ESOPHAGOGASTRODUODENOSCOPY (EGD) WITH PROPOFOL N/A 04/04/2016   Procedure: ESOPHAGOGASTRODUODENOSCOPY (EGD) WITH PROPOFOL;  Surgeon: Midge Minium, MD;  Location: [facility];  Service: Endoscopy;  Laterality: N/A;   GANGLION CYST EXCISION  06/09/2012   Procedure: REMOVAL GANGLION OF WRIST;  Surgeon: Drucilla Schmidt, MD;  Location: [facility];  Service: Orthopedics;  Laterality: Left;  EXCISION OF GANGLION CYST LEFT RING FINGER  IV REGIONAL ANESTHESIA   HEMRROIDECTOMY AND ANAL FISSURE REPAIR  12/16/1963   KNEE ARTHROSCOPY WITH MEDIAL MENISECTOMY Left 11/26/2017   Procedure: KNEE ARTHROSCOPY WITH MEDIAL MENISECTOMY;  Surgeon: Christena Flake, MD;  Location: [facility];  Service: Orthopedics;  Laterality: Left;   LAPAROSCOPIC CHOLECYSTECTOMY  05/13/2002   Gall Bladder   LEFT BREAST DUCTAL EXCISION  10/11/2009   BENIGN   LEFT HAND SURG.   08/16/1979   EXTENSIVE REPAIR AND REMOVAL OF LIGAMENT/TENDON  (?CANCER)   LUMBAR LAMINECTOMY  1990  &  1976   RIGHT SIDE L4 - 5  & L5 - S1   POLYPECTOMY  04/17/2016   Procedure: POLYPECTOMY;  Surgeon: Midge Minium, MD;  Location: [facility];  Service: Endoscopy;;   REMOVAL RIGHT GOIN MASS  08/16/1979   BENIGN   REMOVAL TOE NAILBED  AGE 74   ALL 10 TOES-- DEFORMED   RIGHT BREAST LUMPECTOMY     BENIGN   RIGHT FOOT SURG  X3   REPAIR OF 2 TOES   ROTATOR CUFF REPAIR  04/11/2008   LEFT SHOULDER   SPINE SURGERY     THYROID GOITER REMOVED  08/15/1969   TONSILLECTOMY  CHILD   VAGINAL HYSTERECTOMY/ ANTERIOR & POSTERIOR REPAIR/ TRANSOBTURATOR SLING  04/25/2005   CYSTOCELE/ RECTOCELE/ UTERINE PROLAPSE/ SUI    Prior to Admission medications   Medication Sig Start Date End Date Taking? Authorizing Provider  diltiazem (TIAZAC) 120 MG 24 hr capsule Take 1 capsule by mouth daily. 04/30/21  Yes [provider]  Multiple Vitamins-Minerals (50+ ADULT EYE HEALTH PO) Take by mouth.   Yes [provider]  Multiple Vitamins-Minerals ([facility] SPECTRAVITE ADULT 50+) TABS Take 1 tablet by mouth daily.   Yes [provider]  naproxen (NAPROSYN) 500 MG tablet TAKE 1 TABLET BY MOUTH EVERY DAY WITH FOOD 08/17/23  Yes Cox, Kirsten, MD  PARoxetine (PAXIL) 10 MG tablet TAKE 1 TABLET BY MOUTH EVERY DAY 10/05/23  Yes Cox, Fritzi Mandes, MD  SYNTHROID 125 MCG tablet Take 1 tablet (125 mcg total) by mouth daily before breakfast. 01/22/23  Yes Cox, Kirsten, MD  acetaminophen (ACETAMINOPHEN 8 HOUR) 650 MG CR tablet Take 1 tablet (650 mg total) by mouth every 8 (eight) hours as needed for pain. 01/22/23   Cox, Fritzi Mandes, MD  brimonidine (ALPHAGAN) 0.2 % ophthalmic solution 1 drop 2 (two) times daily. 09/18/23   [provider]  clotrimazole-betamethasone (LOTRISONE) cream Apply 1 Application topically daily. Patient not taking: Reported on 06/16/2023 01/19/23   Lurline Del, FNP  cyanocobalamin  (VITAMIN B12) 1000 MCG/ML injection INJECT 1 ML MONTHLY 03/13/23   Alinda Dooms, NP  latanoprost (XALATAN) 0.005 % ophthalmic solution SMARTSIG:1 Drop(s) In Eye(s) Every Evening 09/18/23   [provider]  potassium chloride SA (KLOR-CON M20) 20 MEQ tablet Take 1 tablet by mouth daily. 08/26/23   [provider]  Probiotic Product (DIGESTIVE ADVANTAGE PO) Take 1 each by mouth every other day.    [provider]  Syringe/Needle, Disp, (SYRINGE 3CC/22GX1") 22G X 1" 3 ML MISC 1 Syringe by Does not apply route once. Please dispense syringe/needle for b12 injections. Pt will self administer her own injections. 04/09/16   Earna Coder, MD  TUBERCULIN SYR 1CC/25GX5/8" (BD SYRINGE SLIP TIP) 25G X 5/8" 1 ML MISC To be used monthly for B 12 injection 09/28/23   Earna Coder, MD    Allergies as of 09/30/2023 - Review Complete 09/30/2023  Allergen Reaction Noted   Latex Rash 04/30/2015   Codeine Nausea And Vomiting 06/03/2012   Contrast media [iodinated contrast media] Nausea And Vomiting and Other (See Comments) 06/03/2012   Indomethacin Nausea And Vomiting and Other (See Comments) 04/15/2016   Adhesive [tape] Rash and Other (See Comments) 06/02/2012    Family History  Problem Relation Age of Onset   Deep vein thrombosis Mother        Right Leg   Heart disease Mother        Before age 69-  CHF   Varicose Veins Mother    Heart attack Mother    Heart disease Father        Before age 8-  PVD   Heart attack Father    Arthritis Father        Gout   Hypertension Father    Cancer Sister        Thyroid-Back   Cancer Sister        Breast and Lung   Heart disease Sister    Heart disease Sister    Other Sister        platelet disorder   Cancer Maternal Grandmother        Breast/Lung Cancer   Colon cancer Maternal Grandmother    Breast cancer Maternal Grandmother    Diabetes Maternal Aunt    Colon cancer Maternal Uncle    Diabetes Maternal Uncle     Heart attack Other        father's side multiple family members   Stroke Other        father's side multiple family members    Social History   Socioeconomic History   Marital status: Divorced    Spouse name: Not on file   Number of children: 3   Years of education: Not on file   Highest education level: 8th grade  Occupational History   Not on file  Tobacco Use   Smoking status: Former    Current packs/day: 0.00    Types: Cigarettes  Start date: 75    Quit date: 14    Years since quitting: 31.8   Smokeless tobacco: Never  Vaping Use   Vaping status: Never Used  Substance and Sexual Activity   Alcohol use: Never   Drug use: Never   Sexual activity: Not Currently  Other Topics Concern   Not on file  Social History Narrative   Lives alone   Retired   Caffeine: coffee 3 cups daily   Social Determinants of Health   Financial Resource Strain: Low Risk  (01/22/2023)   Overall Financial Resource Strain (CARDIA)    Difficulty of Paying Living Expenses: Not hard at all  Food Insecurity: No Food Insecurity (01/22/2023)   Hunger Vital Sign    Worried About Running Out of Food in the Last Year: Never true    Ran Out of Food in the Last Year: Never true  Transportation Needs: No Transportation Needs (01/22/2023)   PRAPARE - Administrator, Civil Service (Medical): No    Lack of Transportation (Non-Medical): No  Physical Activity: Sufficiently Active (01/22/2023)   Exercise Vital Sign    Days of Exercise per Week: 4 days    Minutes of Exercise per Session: 40 min  Stress: No Stress Concern Present (01/22/2023)   [employer] of Occupational Health - Occupational Stress Questionnaire    Feeling of Stress : Not at all  Social Connections: Moderately Isolated (01/22/2023)   Social Connection and Isolation Panel [NHANES]    Frequency of Communication with Friends and Family: More than three times a week    Frequency of Social Gatherings with Friends and Family:  More than three times a week    Attends Religious Services: 1 to 4 times per year    Active Member of [employer] or Organizations: No    Attends Banker Meetings: Never    Marital Status: Divorced  Catering manager Violence: Not At Risk (01/22/2023)   Humiliation, Afraid, Rape, and Kick questionnaire    Fear of Current or Ex-Partner: No    Emotionally Abused: No    Physically Abused: No    Sexually Abused: No    Review of Systems: See HPI, otherwise negative ROS  Physical Exam: BP 118/78   Pulse 99   Temp (!) 97.3 F (36.3 C) (Temporal)   Resp 20   Ht 5\' 2"  (1.575 m)   Wt 69.9 kg   SpO2 96%   BMI 28.17 kg/m  General:   Alert,  pleasant and cooperative in NAD Head:  Normocephalic and atraumatic. Neck:  Supple; no masses or thyromegaly. Lungs:  Clear throughout to auscultation.    Heart:  Regular rate and rhythm. Abdomen:  Soft, nontender and nondistended. Normal bowel sounds, without guarding, and without rebound.   Neurologic:  Alert and  oriented x4;  grossly normal neurologically.  Impression/Plan: Sydney Ayala is here for an colonoscopy to be performed for a history of adenomatous polyps on 2017   Risks, benefits, limitations, and alternatives regarding  colonoscopy have been reviewed with the patient.  Questions have been answered.  All parties agreeable.   Midge Minium, MD  10/29/2023, 10:02 AM

## 2023-10-29 NOTE — Transfer of Care (Signed)
Immediate Anesthesia Transfer of Care Note  Patient: Sydney Ayala  Procedure(s) Performed: COLONOSCOPY WITH PROPOFOL  Patient Location: PACU  Anesthesia Type:General  Level of Consciousness: sedated  Airway & Oxygen Therapy: Patient Spontanous Breathing  Post-op Assessment: Report given to RN and Post -op Vital signs reviewed and stable  Post vital signs: Reviewed and stable  Last Vitals:  Vitals Value Taken Time  BP    Temp    Pulse 77 10/29/23 1034  Resp    SpO2 95 % 10/29/23 1034  Vitals shown include unfiled device data.  Last Pain:  Vitals:   10/29/23 0937  TempSrc: Temporal  PainSc: 0-No pain         Complications: No notable events documented.

## 2023-10-29 NOTE — Op Note (Signed)
[facility]astroenter[facility] Patient Name: Sydney Ayala Procedure Date: 10/29/2023 10:05 AM MRN: [id number] Account #: [id number] Date of Birth: Jul 13, 1946 Admit Type: Outpatient Age: 77 Room: [facility] ENDO ROOM 4 Gender: Female Note Status: Finalized Instrument Name: Prentice Docker [id number] Procedure:             Colonoscopy Indications:           High risk colon cancer surveillance: Personal history                         of colonic polyps Providers:             Midge Minium MD, MD Referring MD:          Kinnie Feil, MD (Referring MD) Medicines:             Propofol per Anesthesia Complications:         No immediate complications. Procedure:             Pre-Anesthesia Assessment:                        - Prior to the procedure, a History and Physical was                         performed, and patient medications and allergies were                         reviewed. The patient's tolerance of previous                         anesthesia was also reviewed. The risks and benefits                         of the procedure and the sedation options and risks                         were discussed with the patient. All questions were                         answered, and informed consent was obtained. Prior                         Anticoagulants: The patient has taken no anticoagulant                         or antiplatelet agents. ASA Grade Assessment: II - A                         patient with mild systemic disease. After reviewing                         the risks and benefits, the patient was deemed in                         satisfactory condition to undergo the procedure.                        After obtaining informed consent, the colonoscope was  passed under direct vision. Throughout the procedure,                         the patient's blood pressure, pulse, and oxygen                         saturations were monitored continuously. The                          Colonoscope was introduced through the anus and                         advanced to the the cecum, identified by appendiceal                         orifice and ileocecal valve. The colonoscopy was                         performed without difficulty. The patient tolerated                         the procedure well. The quality of the bowel                         preparation was excellent. Findings:      The perianal and digital rectal examinations were normal.      Non-bleeding internal hemorrhoids were found during retroflexion. The       hemorrhoids were Grade I (internal hemorrhoids that do not prolapse).      The exam was otherwise without abnormality. Impression:            - Non-bleeding internal hemorrhoids.                        - The examination was otherwise normal.                        - No specimens collected. Recommendation:        - Discharge patient to home.                        - Resume previous diet.                        - Continue present medications.                        - Repeat colonoscopy is not recommended for                         surveillance. Procedure Code(s):     --- Professional ---                        [id number], Colonoscopy, flexible; diagnostic, including                         collection of specimen(s) by brushing or washing, when                         performed (separate procedure) Diagnosis Code(s):     ---  Professional ---                        Z86.010, Personal history of colonic polyps CPT copyright 2022 American Medical Association. All rights reserved. The codes documented in this report are preliminary and upon coder review may  be revised to meet current compliance requirements. Midge Minium MD, MD 10/29/2023 10:30:56 AM This report has been signed electronically. Number of Addenda: 0 Note Initiated On: 10/29/2023 10:05 AM Scope Withdrawal Time: 0 hours 8 minutes 23 seconds  Total Procedure Duration: 0 hours  15 minutes 32 seconds  Estimated Blood Loss:  Estimated blood loss: none.      [facility]

## 2023-10-29 NOTE — Anesthesia Postprocedure Evaluation (Signed)
Anesthesia Post Note  Patient: Sydney Ayala  Procedure(s) Performed: COLONOSCOPY WITH PROPOFOL  Patient location during evaluation: PACU Anesthesia Type: General Level of consciousness: awake and awake and alert Pain management: satisfactory to patient Vital Signs Assessment: post-procedure vital signs reviewed and stable Respiratory status: spontaneous breathing and nonlabored ventilation Anesthetic complications: no   No notable events documented.   Last Vitals:  Vitals:   10/29/23 1044 10/29/23 1054  BP: (!) 98/54 (!) 103/56  Pulse: 75 71  Resp: 14 13  Temp:    SpO2: 97% 98%    Last Pain:  Vitals:   10/29/23 1054  TempSrc:   PainSc: 0-No pain                 VAN STAVEREN,Sarabella Caprio

## 2023-10-30 ENCOUNTER — Encounter: Payer: Self-pay | Admitting: Gastroenterology

## 2023-12-15 ENCOUNTER — Other Ambulatory Visit: Payer: Self-pay | Admitting: Family Medicine

## 2023-12-15 DIAGNOSIS — E038 Other specified hypothyroidism: Secondary | ICD-10-CM

## 2024-01-28 ENCOUNTER — Telehealth: Payer: Self-pay

## 2024-01-28 ENCOUNTER — Other Ambulatory Visit: Payer: Self-pay | Admitting: Family Medicine

## 2024-01-28 DIAGNOSIS — E038 Other specified hypothyroidism: Secondary | ICD-10-CM

## 2024-01-28 NOTE — Telephone Encounter (Signed)
I called the patient today to see about getting an appointment scheduled for chronic f.up. Looks like this patient is currently overdue for an appointment. I left a message for the patient to call the office back.  NOTE: If the patient does not call back within a week to schedule this appointment, the front staff will mail the patient a letter requesting to call the office back.

## 2024-01-28 NOTE — Telephone Encounter (Signed)
Last Fill: Synthroid: 12/17/23 90 tabs/0 refills     Paxil: Today 30 tabs/0 refills  Last OV:  01/22/23 Next OV: 03/01/24  Routing to provider for review/authorization.

## 2024-01-28 NOTE — Telephone Encounter (Signed)
Copied from CRM [id number]. Topic: Clinical - Medication Refill >> Jan 28, 2024  5:12 PM Shelah Lewandowsky wrote: Most Recent Primary Care Visit:  Provider: Jacklynn Bue  Department: [facility]  Visit Type: MEDICARE AWV, INITIAL  Date: 06/16/2023  Medication: SYNTHROID 125 MCG tablet PARoxetine (PAXIL) 10 MG tablet   Has the patient contacted their pharmacy? Yes (Agent: If no, request that the patient contact the pharmacy for the refill. If patient does not wish to contact the pharmacy document the reason why and proceed with request.) (Agent: If yes, when and what did the pharmacy advise?)  Is this the correct pharmacy for this prescription? Yes If no, delete pharmacy and type the correct one.  This is the patient's preferred pharmacy:  [id number] - [place], [place] - [address] AT [facility] [address] [place] [address] Phone: [phone] Fax: [phone]   Has the prescription been filled recently? Yes  Is the patient out of the medication? Yes  Has the patient been seen for an appointment in the last year OR does the patient have an upcoming appointment? Yes  Can we respond through MyChart? No  Agent: Please be advised that Rx refills may take up to 3 business days. We ask that you follow-up with your pharmacy.

## 2024-01-29 MED ORDER — PAROXETINE HCL 10 MG PO TABS: 10.0000 mg | ORAL_TABLET | Freq: Every day | ORAL | 0 refills | Status: DC

## 2024-01-29 MED ORDER — SYNTHROID 125 MCG PO TABS: 125.0000 ug | ORAL_TABLET | Freq: Every day | ORAL | 0 refills | Status: DC

## 2024-02-02 ENCOUNTER — Other Ambulatory Visit: Payer: Self-pay | Admitting: Family Medicine

## 2024-02-02 NOTE — Telephone Encounter (Signed)
Copied from CRM [id number]. Topic: Clinical - Medication Refill >> Feb 02, 2024  4:49 PM Louie Casa B wrote: Most Recent Primary Care Visit:  Provider: Jacklynn Bue  Department: [facility]  Visit Type: MEDICARE AWV, INITIAL  Date: 06/16/2023  Medication: PARoxetine (PAXIL) 10 MG tablet  Has the patient contacted their pharmacy? Yes (Agent: If no, request that the patient contact the pharmacy for the refill. If patient does not wish to contact the pharmacy document the reason why and proceed with request.) (Agent: If yes, when and what did the pharmacy advise?)  Is this the correct pharmacy for this prescription? Yes If no, delete pharmacy and type the correct one.  This is the patient's preferred pharmacy:  [id number] - [place], [place] - [address] AT [facility] [address] [place] [address] Phone: [phone] Fax: [phone]   Has the prescription been filled recently? Yes  Is the patient out of the medication? Yes  Has the patient been seen for an appointment in the last year OR does the patient have an upcoming appointment? Yes  Can we respond through MyChart? Yes  Agent: Please be advised that Rx refills may take up to 3 business days. We ask that you follow-up with your pharmacy.

## 2024-02-04 NOTE — Telephone Encounter (Signed)
The patient has an appointment scheduled for 03/01/2024

## 2024-02-16 ENCOUNTER — Other Ambulatory Visit: Payer: Self-pay | Admitting: Nurse Practitioner

## 2024-02-16 DIAGNOSIS — D518 Other vitamin B12 deficiency anemias: Secondary | ICD-10-CM

## 2024-02-16 DIAGNOSIS — R911 Solitary pulmonary nodule: Secondary | ICD-10-CM

## 2024-02-16 DIAGNOSIS — R17 Unspecified jaundice: Secondary | ICD-10-CM

## 2024-02-19 ENCOUNTER — Telehealth: Payer: Self-pay

## 2024-02-19 NOTE — Telephone Encounter (Signed)
 Copied from CRM [id number]. Topic: General - Billing Inquiry >> Feb 19, 2024 10:35 AM Fuller Mandril wrote: Reason for CRM: Patient called because she received a bill from [facility] phone: [phone] from 01/22/2023 current bal 98.08. It is asking about secondary insurance and changing primary care provider. States she previously spoke with clinic and was told a different amount. States she just received it and she called the number on the bill to see what it was for. Has tried to call medicaid but has not received response. She would like someone to reach out to her with more information. 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 Thank You

## 2024-02-19 NOTE — Telephone Encounter (Signed)
 Patient notified to contact the [facility] billing department. No balance with our office/cone that I can see.

## 2024-02-22 ENCOUNTER — Other Ambulatory Visit: Payer: Self-pay | Admitting: Nurse Practitioner

## 2024-02-22 DIAGNOSIS — R911 Solitary pulmonary nodule: Secondary | ICD-10-CM

## 2024-02-22 DIAGNOSIS — D518 Other vitamin B12 deficiency anemias: Secondary | ICD-10-CM

## 2024-02-22 DIAGNOSIS — R17 Unspecified jaundice: Secondary | ICD-10-CM

## 2024-03-01 ENCOUNTER — Ambulatory Visit: Payer: 59 | Admitting: Family Medicine

## 2024-03-01 ENCOUNTER — Encounter: Payer: Self-pay | Admitting: Family Medicine

## 2024-03-01 VITALS — BP 136/84 | HR 84 | Temp 98.2°F | Ht 62.0 in | Wt 164.0 lb

## 2024-03-01 DIAGNOSIS — E782 Mixed hyperlipidemia: Secondary | ICD-10-CM

## 2024-03-01 DIAGNOSIS — G5603 Carpal tunnel syndrome, bilateral upper limbs: Secondary | ICD-10-CM

## 2024-03-01 DIAGNOSIS — I10 Essential (primary) hypertension: Secondary | ICD-10-CM | POA: Diagnosis not present

## 2024-03-01 DIAGNOSIS — E038 Other specified hypothyroidism: Secondary | ICD-10-CM

## 2024-03-01 DIAGNOSIS — G6289 Other specified polyneuropathies: Secondary | ICD-10-CM

## 2024-03-01 DIAGNOSIS — G250 Essential tremor: Secondary | ICD-10-CM

## 2024-03-01 DIAGNOSIS — M1811 Unilateral primary osteoarthritis of first carpometacarpal joint, right hand: Secondary | ICD-10-CM

## 2024-03-01 DIAGNOSIS — I480 Paroxysmal atrial fibrillation: Secondary | ICD-10-CM

## 2024-03-01 DIAGNOSIS — R42 Dizziness and giddiness: Secondary | ICD-10-CM

## 2024-03-01 NOTE — Progress Notes (Signed)
 Subjective:  Patient ID: Sydney Ayala, female    DOB: 08-07-1946  Age: 78 y.o. MRN: [id number]  Chief Complaint  Patient presents with   Medical Management of Chronic Issues   Discussed the use of AI scribe software for clinical note transcription with the patient, who gave verbal consent to proceed.  History of Present Illness   Sydney Ayala is a 78 year old female with atrial fibrillation who presents for follow-up and medication review.  She has a history of atrial fibrillation and was previously hospitalized for this condition. She is currently taking diltiazem to manage her heart rate. She has not been placed on any anticoagulants and experiences occasional palpitations, which she attributes to past rheumatic fever. She previously wore a heart monitor and underwent a stress test at a cardiology clinic. No chest pain, dyspnea, fevers, chills, or sweats.  She has a history of peripheral neuropathy, experiencing numbness in her toes. She was treated by Dr. Malvin Johns and was prescribed gabapentin and pregabalin, both of which caused adverse effects such as dizziness and emotional changes, leading to discontinuation of these medications.  She has been experiencing swelling and darkening of her legs, which has been evaluated by vein specialists. Both legs are affected, with intermittent swelling. She has reduced her salt intake to manage the swelling.  She has a history of vertigo, which was treated with physical therapy maneuvers. She takes Paxil 10 mg daily, which has been effective in preventing vertigo episodes. Missing a dose can lead to symptoms returning.  She is scheduled for left carpal tunnel surgery and has previously undergone right carpal tunnel surgery. She experiences pain in her right thumb due to osteoarthritis, for which she received corticosteroid injections. The injections have provided some relief, although she still experiences pain with certain activities.  She reports  a new symptom of lip quivering, which her grandson first noticed. It occurs more frequently when she is in a hurry or stressed. No associated tremors in her hands or other symptoms like fever, chills, or sweats.  She has a history of hyperlipidemia but is not currently on any lipid-lowering medication due to the number of medications she is already taking.      B12 deficiency: On b12 shots once monthly.      03/01/2024    2:54 PM 06/16/2023    2:34 PM 01/22/2023    2:41 PM 03/18/2022   10:59 AM 11/05/2020   10:18 AM  Depression screen PHQ 2/9  Decreased Interest 0 0 0 0 0  Down, Depressed, Hopeless 0 0 0 0 0  PHQ - 2 Score 0 0 0 0 0  Altered sleeping     0  Tired, decreased energy     0  Change in appetite     0  Feeling bad or failure about yourself      0  Trouble concentrating     0  Moving slowly or fidgety/restless     0  Suicidal thoughts     0  PHQ-9 Score     0        06/16/2023    2:25 PM  Fall Risk   Falls in the past year? 1  Number falls in past yr: 1  Injury with Fall? 0    Patient Care Team: Blane Ohara, MD as PCP - General (Family Medicine) Alwyn Pea, MD as Consulting Physician (Cardiology)   Review of Systems  Constitutional:  Negative for chills, fatigue and fever.  HENT:  Negative for congestion, ear pain and sore throat.   Respiratory:  Negative for cough and shortness of breath.   Cardiovascular:  Negative for chest pain.  Gastrointestinal:  Negative for abdominal pain, constipation, diarrhea, nausea and vomiting.  Genitourinary:  Negative for dysuria and urgency.  Musculoskeletal:  Positive for arthralgias. Negative for myalgias.  Skin:  Negative for rash.  Neurological:  Positive for tremors. Negative for dizziness and headaches.  Psychiatric/Behavioral:  Negative for dysphoric mood. The patient is not nervous/anxious.     Current Outpatient Medications on File Prior to Visit  Medication Sig Dispense Refill   acetaminophen (ACETAMINOPHEN 8  HOUR) 650 MG CR tablet Take 1 tablet (650 mg total) by mouth every 8 (eight) hours as needed for pain. 90 tablet 0   cyanocobalamin (VITAMIN B12) 1000 MCG/ML injection INJECT 1 ML MONTHLY 3 mL 3   diltiazem (TIAZAC) 120 MG 24 hr capsule Take 1 capsule by mouth daily.     latanoprost (XALATAN) 0.005 % ophthalmic solution SMARTSIG:1 Drop(s) In Eye(s) Every Evening     Multiple Vitamins-Minerals ([employer] SPECTRAVITE ADULT 50+) TABS Take 1 tablet by mouth daily.     naproxen (NAPROSYN) 500 MG tablet TAKE 1 TABLET BY MOUTH EVERY DAY WITH FOOD (Patient taking differently: 250 mg.) 30 tablet 6   PARoxetine (PAXIL) 10 MG tablet TAKE 1 TABLET BY MOUTH EVERY DAY 90 tablet 0   potassium chloride SA (KLOR-CON M20) 20 MEQ tablet Take 1 tablet by mouth daily.     Probiotic Product (DIGESTIVE ADVANTAGE PO) Take 1 each by mouth every other day.     Syringe/Needle, Disp, (SYRINGE 3CC/22GX1") 22G X 1" 3 ML MISC 1 Syringe by Does not apply route once. Please dispense syringe/needle for b12 injections. Pt will self administer her own injections. 50 each 4   TUBERCULIN SYR 1CC/25GX5/8" (BD SYRINGE SLIP TIP) 25G X 5/8" 1 ML MISC To be used monthly for B 12 injection 4 each 3   No current facility-administered medications on file prior to visit.   Past Medical History:  Diagnosis Date   Chronic venous insufficiency    Dysrhythmia    A Fib during hospital stay 04/03/16   History of rheumatic fever CHILDHOOD   Hypothyroidism    Injury of tendon of long head of right biceps 2018   Pinched nerve in neck    Past Surgical History:  Procedure Laterality Date   ABDOMINAL HYSTERECTOMY     CARDIOVASCULAR STRESS TEST  03/15/2008   NORMAL LVSF/ EF 61%/ NO ISCHEMIA   CATARACT EXTRACTION W/ INTRAOCULAR LENS IMPLANT & ANTERIOR VITRECTOMY, BILATERAL Bilateral 02/2021   COLONOSCOPY WITH PROPOFOL N/A 04/17/2016   Procedure: COLONOSCOPY WITH PROPOFOL;  Surgeon: Midge Minium, MD;  Location: [facility];  Service: Endoscopy;   Laterality: N/A;   COLONOSCOPY WITH PROPOFOL N/A 10/29/2023   Procedure: COLONOSCOPY WITH PROPOFOL;  Surgeon: Midge Minium, MD;  Location: [facility];  Service: Endoscopy;  Laterality: N/A;   DILATION AND CURETTAGE OF UTERUS     ESOPHAGOGASTRODUODENOSCOPY (EGD) WITH PROPOFOL N/A 04/04/2016   Procedure: ESOPHAGOGASTRODUODENOSCOPY (EGD) WITH PROPOFOL;  Surgeon: Midge Minium, MD;  Location: [facility];  Service: Endoscopy;  Laterality: N/A;   GANGLION CYST EXCISION  06/09/2012   Procedure: REMOVAL GANGLION OF WRIST;  Surgeon: Drucilla Schmidt, MD;  Location: [facility];  Service: Orthopedics;  Laterality: Left;  EXCISION OF GANGLION CYST LEFT RING FINGER  IV REGIONAL ANESTHESIA   HEMRROIDECTOMY AND ANAL FISSURE REPAIR  12/16/1963  KNEE ARTHROSCOPY WITH MEDIAL MENISECTOMY Left 11/26/2017   Procedure: KNEE ARTHROSCOPY WITH MEDIAL MENISECTOMY;  Surgeon: Christena Flake, MD;  Location: [facility];  Service: Orthopedics;  Laterality: Left;   LAPAROSCOPIC CHOLECYSTECTOMY  05/13/2002   Gall Bladder   LEFT BREAST DUCTAL EXCISION  10/11/2009   BENIGN   LEFT HAND SURG.  08/16/1979   EXTENSIVE REPAIR AND REMOVAL OF LIGAMENT/TENDON  (?CANCER)   LUMBAR LAMINECTOMY  1990  &  1976   RIGHT SIDE L4 - 5  & L5 - S1   POLYPECTOMY  04/17/2016   Procedure: POLYPECTOMY;  Surgeon: Midge Minium, MD;  Location: [facility];  Service: Endoscopy;;   REMOVAL RIGHT GOIN MASS  08/16/1979   BENIGN   REMOVAL TOE NAILBED  AGE 33   ALL 10 TOES-- DEFORMED   RIGHT BREAST LUMPECTOMY     BENIGN   RIGHT FOOT SURG  X3   REPAIR OF 2 TOES   ROTATOR CUFF REPAIR  04/11/2008   LEFT SHOULDER   SPINE SURGERY     THYROID GOITER REMOVED  08/15/1969   TONSILLECTOMY  CHILD   VAGINAL HYSTERECTOMY/ ANTERIOR & POSTERIOR REPAIR/ TRANSOBTURATOR SLING  04/25/2005   CYSTOCELE/ RECTOCELE/ UTERINE PROLAPSE/ SUI    Family History  Problem Relation Age of Onset   Deep vein thrombosis Mother        Right Leg    Heart disease Mother        Before age 37-  CHF   Varicose Veins Mother    Heart attack Mother    Heart disease Father        Before age 30-  PVD   Heart attack Father    Arthritis Father        Gout   Hypertension Father    Cancer Sister        Thyroid-Back   Cancer Sister        Breast and Lung   Heart disease Sister    Heart disease Sister    Other Sister        platelet disorder   Cancer Maternal Grandmother        Breast/Lung Cancer   Colon cancer Maternal Grandmother    Breast cancer Maternal Grandmother    Diabetes Maternal Aunt    Colon cancer Maternal Uncle    Diabetes Maternal Uncle    Heart attack Other        father's side multiple family members   Stroke Other        father's side multiple family members   Social History   Socioeconomic History   Marital status: Divorced    Spouse name: Not on file   Number of children: 3   Years of education: Not on file   Highest education level: 8th grade  Occupational History   Not on file  Tobacco Use   Smoking status: Former    Current packs/day: 0.00    Types: Cigarettes    Start date: 79    Quit date: 1993    Years since quitting: 32.2   Smokeless tobacco: Never  Vaping Use   Vaping status: Never Used  Substance and Sexual Activity   Alcohol use: Never   Drug use: Never   Sexual activity: Not Currently  Other Topics Concern   Not on file  Social History Narrative   Lives alone   Retired   Caffeine: coffee 3 cups daily   Social Drivers of Corporate investment banker Strain: Low Risk  (  03/01/2024)   Overall Financial Resource Strain (CARDIA)    Difficulty of Paying Living Expenses: Not hard at all  Food Insecurity: No Food Insecurity (03/01/2024)   Hunger Vital Sign    Worried About Running Out of Food in the Last Year: Never true    Ran Out of Food in the Last Year: Never true  Transportation Needs: No Transportation Needs (03/01/2024)   PRAPARE - Administrator, Civil Service  (Medical): No    Lack of Transportation (Non-Medical): No  Physical Activity: Sufficiently Active (03/01/2024)   Exercise Vital Sign    Days of Exercise per Week: 4 days    Minutes of Exercise per Session: 40 min  Stress: No Stress Concern Present (03/01/2024)   [employer] of Occupational Health - Occupational Stress Questionnaire    Feeling of Stress : Not at all  Social Connections: Moderately Isolated (03/01/2024)   Social Connection and Isolation Panel [NHANES]    Frequency of Communication with Friends and Family: More than three times a week    Frequency of Social Gatherings with Friends and Family: More than three times a week    Attends Religious Services: 1 to 4 times per year    Active Member of [employer] or Organizations: No    Attends Engineer, structural: Never    Marital Status: Divorced    Objective:  BP 136/84   Pulse 84   Temp 98.2 F (36.8 C)   Ht 5\' 2"  (1.575 m)   Wt 164 lb (74.4 kg)   SpO2 94%   BMI 30.00 kg/m      03/01/2024    2:50 PM 10/29/2023   10:54 AM 10/29/2023   10:44 AM  BP/Weight  Systolic BP 136 103 98  Diastolic BP 84 56 54  Wt. (Lbs) 164    BMI 30 kg/m2      Physical Exam Vitals reviewed.  Constitutional:      Appearance: Normal appearance. She is normal weight.  Neck:     Vascular: No carotid bruit.  Cardiovascular:     Rate and Rhythm: Normal rate. Rhythm irregular.     Heart sounds: Normal heart sounds.  Pulmonary:     Effort: Pulmonary effort is normal. No respiratory distress.     Breath sounds: Normal breath sounds.  Abdominal:     General: Abdomen is flat. Bowel sounds are normal.     Palpations: Abdomen is soft.     Tenderness: There is no abdominal tenderness.  Neurological:     Mental Status: She is alert and oriented to person, place, and time.  Psychiatric:        Mood and Affect: Mood normal.        Behavior: Behavior normal.     Diabetic Foot Exam - Simple   No data filed      Lab Results   Component Value Date   WBC 7.1 03/01/2024   HGB 13.8 03/01/2024   HCT 43.3 03/01/2024   PLT 214 03/01/2024   GLUCOSE 81 03/01/2024   CHOL 198 03/01/2024   TRIG 106 03/01/2024   HDL 74 03/01/2024   LDLCALC 105 (H) 03/01/2024   ALT 13 03/01/2024   AST 21 03/01/2024   NA 142 03/01/2024   K 4.9 03/01/2024   CL 102 03/01/2024   CREATININE 0.92 03/01/2024   BUN 19 03/01/2024   CO2 25 03/01/2024   TSH 2.960 03/01/2024   INR 1.16 04/03/2016      Assessment & Plan:  Mixed hyperlipidemia Assessment & Plan: Recommend continue to work on eating healthy diet and exercise. Patient refused medicine at this time.   Orders: -     Lipid panel  Essential hypertension Assessment & Plan: Continue diltiazem 120 mg daily.   Orders: -     CBC with Differential/Platelet -     Comprehensive metabolic panel  Other specified hypothyroidism Assessment & Plan: Previously well controlled Continue Synthroid at current dose  Recheck TSH and adjust Synthroid as indicated    Orders: -     TSH  Other polyneuropathy Assessment & Plan: Peripheral neuropathy with numbness in toes. Previous treatment with gabapentin and pregabalin was discontinued due to adverse effects, including dizziness and emotional changes. Current symptoms are well-managed without medication. - No current pharmacological treatment due to adverse effects of previous medications.   Paroxysmal atrial fibrillation [facility]) Assessment & Plan: Chronic atrial fibrillation managed with diltiazem. No recent episodes of palpitations or other symptoms. Discussed the risk of stroke associated with atrial fibrillation and the potential need for anticoagulation. She reports easy bruising and bleeding, complicating anticoagulation therapy. - Continue diltiazem for rate control.   Vertigo Assessment & Plan: Vertigo managed with Paxil, which has been effective in preventing episodes. No recent episodes except when medication is  missed. - Continue Paxil for vertigo management.   Essential tremor Assessment & Plan: Intermittent lip tremor, more frequent when rushed or stressed. No tremor observed in hands during examination. Discussed potential treatment with propranolol, but she prefers to avoid additional medications due to current symptom control. - Consider propranolol if symptoms worsen or interfere with daily activities.   Osteoarthritis of right thumb Assessment & Plan: Recommend tylenol   Carpal tunnel syndrome, bilateral Assessment & Plan: Right carpal tunnel syndrome previously treated with surgery. Left carpal tunnel syndrome scheduled for outpatient surgery on March 28. She wears a brace for symptom management until surgery. - Proceed with left carpal tunnel surgery on March 28. - Continue wearing brace for symptom management until surgery.      No orders of the defined types were placed in this encounter.   Orders Placed This Encounter  Procedures   CBC with Differential/Platelet   Comprehensive metabolic panel   TSH   Lipid panel     Follow-up: Return in about 6 months (around 09/01/2024) for chronic follow up.   I,Katherina A Bramblett,acting as a scribe for Blane Ohara, MD.,have documented all relevant documentation on the behalf of Blane Ohara, MD,as directed by  Blane Ohara, MD while in the presence of Blane Ohara, MD.   Clayborn Bigness I Leal-Borjas,acting as a scribe for Blane Ohara, MD.,have documented all relevant documentation on the behalf of Blane Ohara, MD,as directed by  Blane Ohara, MD while in the presence of Blane Ohara, MD.    An After Visit Summary was printed and given to the patient.  I attest that I have reviewed this visit and agree with the plan scribed by my staff.   Blane Ohara, MD [facility] [phone]

## 2024-03-02 LAB — TSH: TSH: 2.96 u[IU]/mL (ref 0.450–4.500)

## 2024-03-02 LAB — COMPREHENSIVE METABOLIC PANEL
ALT: 13 IU/L (ref 0–32)
AST: 21 IU/L (ref 0–40)
Albumin: 4.6 g/dL (ref 3.8–4.8)
Alkaline Phosphatase: 108 IU/L (ref 44–121)
BUN/Creatinine Ratio: 21 (ref 12–28)
BUN: 19 mg/dL (ref 8–27)
Bilirubin Total: 0.5 mg/dL (ref 0.0–1.2)
CO2: 25 mmol/L (ref 20–29)
Calcium: 10 mg/dL (ref 8.7–10.3)
Chloride: 102 mmol/L (ref 96–106)
Creatinine, Ser: 0.92 mg/dL (ref 0.57–1.00)
Globulin, Total: 2.5 g/dL (ref 1.5–4.5)
Glucose: 81 mg/dL (ref 70–99)
Potassium: 4.9 mmol/L (ref 3.5–5.2)
Sodium: 142 mmol/L (ref 134–144)
Total Protein: 7.1 g/dL (ref 6.0–8.5)
eGFR: 65 mL/min/{1.73_m2} (ref >59)

## 2024-03-02 LAB — CBC WITH DIFFERENTIAL/PLATELET
Basophils Absolute: 0.1 10*3/uL (ref 0.0–0.2)
Basos: 1 %
EOS (ABSOLUTE): 0.3 10*3/uL (ref 0.0–0.4)
Eos: 4 %
Hematocrit: 43.3 % (ref 34.0–46.6)
Hemoglobin: 13.8 g/dL (ref 11.1–15.9)
Immature Grans (Abs): 0 10*3/uL (ref 0.0–0.1)
Immature Granulocytes: 0 %
Lymphocytes Absolute: 2.5 10*3/uL (ref 0.7–3.1)
Lymphs: 35 %
MCH: 29.7 pg (ref 26.6–33.0)
MCHC: 31.9 g/dL (ref 31.5–35.7)
MCV: 93 fL (ref 79–97)
Monocytes Absolute: 0.7 10*3/uL (ref 0.1–0.9)
Monocytes: 10 %
Neutrophils Absolute: 3.6 10*3/uL (ref 1.4–7.0)
Neutrophils: 50 %
Platelets: 214 10*3/uL (ref 150–450)
RBC: 4.64 x10E6/uL (ref 3.77–5.28)
RDW: 12.8 % (ref 11.7–15.4)
WBC: 7.1 10*3/uL (ref 3.4–10.8)

## 2024-03-02 LAB — LIPID PANEL
Chol/HDL Ratio: 2.7 ratio (ref 0.0–4.4)
Cholesterol, Total: 198 mg/dL (ref 100–199)
HDL: 74 mg/dL (ref >39)
LDL Chol Calc (NIH): 105 mg/dL — ABNORMAL HIGH (ref 0–99)
Triglycerides: 106 mg/dL (ref 0–149)
VLDL Cholesterol Cal: 19 mg/dL (ref 5–40)

## 2024-03-03 ENCOUNTER — Other Ambulatory Visit: Payer: Self-pay

## 2024-03-03 DIAGNOSIS — E038 Other specified hypothyroidism: Secondary | ICD-10-CM

## 2024-03-03 MED ORDER — SYNTHROID 125 MCG PO TABS: 125.0000 ug | ORAL_TABLET | Freq: Every day | ORAL | 0 refills | Status: DC

## 2024-03-05 DIAGNOSIS — G5603 Carpal tunnel syndrome, bilateral upper limbs: Secondary | ICD-10-CM | POA: Insufficient documentation

## 2024-03-05 DIAGNOSIS — R42 Dizziness and giddiness: Secondary | ICD-10-CM | POA: Insufficient documentation

## 2024-03-05 DIAGNOSIS — G250 Essential tremor: Secondary | ICD-10-CM | POA: Insufficient documentation

## 2024-03-05 DIAGNOSIS — M1811 Unilateral primary osteoarthritis of first carpometacarpal joint, right hand: Secondary | ICD-10-CM | POA: Insufficient documentation

## 2024-03-05 NOTE — Assessment & Plan Note (Signed)
 Peripheral neuropathy with numbness in toes. Previous treatment with gabapentin and pregabalin was discontinued due to adverse effects, including dizziness and emotional changes. Current symptoms are well-managed without medication. - No current pharmacological treatment due to adverse effects of previous medications.

## 2024-03-05 NOTE — Assessment & Plan Note (Addendum)
Recommend tylenol.  

## 2024-03-05 NOTE — Assessment & Plan Note (Signed)
 Vertigo managed with Paxil, which has been effective in preventing episodes. No recent episodes except when medication is missed. - Continue Paxil for vertigo management.

## 2024-03-05 NOTE — Assessment & Plan Note (Signed)
Continue diltiazem 120 mg daily.  

## 2024-03-05 NOTE — Assessment & Plan Note (Signed)
 Intermittent lip tremor, more frequent when rushed or stressed. No tremor observed in hands during examination. Discussed potential treatment with propranolol, but she prefers to avoid additional medications due to current symptom control. - Consider propranolol if symptoms worsen or interfere with daily activities.

## 2024-03-05 NOTE — Assessment & Plan Note (Signed)
Recommend continue to work on eating healthy diet and exercise. Patient refused medicine at this time.

## 2024-03-05 NOTE — Assessment & Plan Note (Signed)
 Right carpal tunnel syndrome previously treated with surgery. Left carpal tunnel syndrome scheduled for outpatient surgery on March 28. She wears a brace for symptom management until surgery. - Proceed with left carpal tunnel surgery on March 28. - Continue wearing brace for symptom management until surgery.

## 2024-03-05 NOTE — Assessment & Plan Note (Signed)
 Previously well controlled Continue Synthroid at current dose  Recheck TSH and adjust Synthroid as indicated

## 2024-03-05 NOTE — Assessment & Plan Note (Addendum)
 Chronic atrial fibrillation managed with diltiazem. No recent episodes of palpitations or other symptoms. Discussed the risk of stroke associated with atrial fibrillation and the potential need for anticoagulation. She reports easy bruising and bleeding, complicating anticoagulation therapy. - Continue diltiazem for rate control.

## 2024-03-06 ENCOUNTER — Other Ambulatory Visit: Payer: Self-pay | Admitting: Nurse Practitioner

## 2024-03-06 DIAGNOSIS — R911 Solitary pulmonary nodule: Secondary | ICD-10-CM

## 2024-03-06 DIAGNOSIS — R17 Unspecified jaundice: Secondary | ICD-10-CM

## 2024-03-06 DIAGNOSIS — D518 Other vitamin B12 deficiency anemias: Secondary | ICD-10-CM

## 2024-03-09 ENCOUNTER — Other Ambulatory Visit: Payer: Self-pay | Admitting: Nurse Practitioner

## 2024-03-09 DIAGNOSIS — D518 Other vitamin B12 deficiency anemias: Secondary | ICD-10-CM

## 2024-03-09 DIAGNOSIS — R17 Unspecified jaundice: Secondary | ICD-10-CM

## 2024-03-09 DIAGNOSIS — R911 Solitary pulmonary nodule: Secondary | ICD-10-CM

## 2024-03-11 ENCOUNTER — Telehealth: Payer: Self-pay | Admitting: *Deleted

## 2024-03-11 DIAGNOSIS — D518 Other vitamin B12 deficiency anemias: Secondary | ICD-10-CM

## 2024-03-11 DIAGNOSIS — R17 Unspecified jaundice: Secondary | ICD-10-CM

## 2024-03-11 DIAGNOSIS — R911 Solitary pulmonary nodule: Secondary | ICD-10-CM

## 2024-03-11 MED ORDER — CYANOCOBALAMIN 1000 MCG/ML IJ SOLN: INTRAMUSCULAR | 1 refills | Status: DC

## 2024-03-11 NOTE — Addendum Note (Signed)
 Addended by: Corene Cornea on: 03/11/2024 01:24 PM   Modules accepted: Orders

## 2024-03-11 NOTE — Telephone Encounter (Signed)
 The patient called back ans for now she wants to have md to give b12 inj. She is coming up on a surgery and she wants to get through that. She does have a pcp and she will see if they can do that for her and then she would let us know if she needs to continue with Kizzie Bane because she has not been here since 2020.

## 2024-03-11 NOTE — Telephone Encounter (Signed)
 Pt has not been seen sonce 2018. Pt has appts and one was no show and other cancelled. I told her that we can send b12 into pharmacy but you will need a visit sometime April or may so we can get back to see MD. The other she can get her b12  RX and let PCP f/u. I am waiting for  pt to answer what she wants to do.

## 2024-04-06 ENCOUNTER — Other Ambulatory Visit: Payer: Self-pay | Admitting: Family Medicine

## 2024-04-06 DIAGNOSIS — E538 Deficiency of other specified B group vitamins: Secondary | ICD-10-CM

## 2024-04-06 NOTE — Telephone Encounter (Signed)
 Copied from CRM [id number]. Topic: Clinical - Lab/Test Results >> Apr 05, 2024  3:14 PM Sydney Ayala wrote: Reason for CRM:  Sydney Ayala wants to see if Dr. Reinhold Carbine would order a B 12 lab check every 12 months. She needs her B 12 checked now. This office would be closer for her. Also Dr. Reinhold Carbine would have to order her B 12 shots and [name] gives herself the shots.  Please call and let her know if you can do this. Her number is [phone].

## 2024-04-06 NOTE — Telephone Encounter (Signed)
 Called patient left detailed message to call office and schedule nurse visit and lab visit.

## 2024-04-07 ENCOUNTER — Telehealth: Payer: Self-pay

## 2024-04-07 NOTE — Telephone Encounter (Signed)
 Copied from CRM [id number]. Topic: Appointments - Scheduling Inquiry for Clinic >> Apr 06, 2024  5:09 PM DeAngela L wrote: Reason for CRM: Patient returning call after office left vm to get scheduled for Lab appt Patient number [phone] (M)

## 2024-04-07 NOTE — Telephone Encounter (Signed)
 The lab appointment has been scheduled.

## 2024-04-11 ENCOUNTER — Other Ambulatory Visit

## 2024-04-11 DIAGNOSIS — E538 Deficiency of other specified B group vitamins: Secondary | ICD-10-CM

## 2024-04-12 LAB — VITAMIN B12: Vitamin B-12: 484 pg/mL (ref 232–1245)

## 2024-04-13 ENCOUNTER — Other Ambulatory Visit: Payer: Self-pay

## 2024-04-13 DIAGNOSIS — D518 Other vitamin B12 deficiency anemias: Secondary | ICD-10-CM

## 2024-04-13 DIAGNOSIS — R17 Unspecified jaundice: Secondary | ICD-10-CM

## 2024-04-13 DIAGNOSIS — R911 Solitary pulmonary nodule: Secondary | ICD-10-CM

## 2024-04-13 MED ORDER — SYRINGE 22G X 1" 3 ML MISC: 1.0000 | Freq: Once | 4 refills | Status: AC

## 2024-04-13 MED ORDER — CYANOCOBALAMIN 1000 MCG/ML IJ SOLN: INTRAMUSCULAR | 1 refills | Status: AC

## 2024-04-25 DIAGNOSIS — H40022 Open angle with borderline findings, high risk, left eye: Secondary | ICD-10-CM | POA: Diagnosis not present

## 2024-04-25 DIAGNOSIS — H11153 Pinguecula, bilateral: Secondary | ICD-10-CM | POA: Diagnosis not present

## 2024-04-29 ENCOUNTER — Other Ambulatory Visit: Payer: Self-pay | Admitting: Family Medicine

## 2024-05-17 DIAGNOSIS — M1811 Unilateral primary osteoarthritis of first carpometacarpal joint, right hand: Secondary | ICD-10-CM | POA: Diagnosis not present

## 2024-05-24 ENCOUNTER — Other Ambulatory Visit: Payer: Self-pay

## 2024-05-24 DIAGNOSIS — E782 Mixed hyperlipidemia: Secondary | ICD-10-CM

## 2024-05-24 DIAGNOSIS — I10 Essential (primary) hypertension: Secondary | ICD-10-CM

## 2024-05-24 DIAGNOSIS — E538 Deficiency of other specified B group vitamins: Secondary | ICD-10-CM

## 2024-05-25 ENCOUNTER — Telehealth: Payer: Self-pay | Admitting: Family Medicine

## 2024-05-25 NOTE — Telephone Encounter (Signed)
 Copied from CRM [id number]. Topic: Clinical - Request for Lab/Test Order >> May 24, 2024 12:20 PM Baldemar Lev wrote: Reason for CRM: Pt wants to come in for lab work prior to her appt in September. Says thyroid  and B12 are among the labs that need to be checked.  Best contact: [phone]

## 2024-06-28 ENCOUNTER — Telehealth: Payer: Self-pay | Admitting: Family Medicine

## 2024-06-28 NOTE — Telephone Encounter (Signed)
 Copied from CRM [id number]. Topic: Clinical - Medication Question >> Jun 28, 2024  1:27 PM Rosaria E wrote: Reason for CRM: Pt has a question about medication that she wants to take, she just wants to make sure that it does not interfere with her medications.  Its called Qunol - good for memory, focus, attention, alertness, mental energy   Best contact: [phone]

## 2024-06-29 NOTE — Telephone Encounter (Signed)
Left message informing patient of this information.  

## 2024-07-18 ENCOUNTER — Ambulatory Visit: Admitting: Family Medicine

## 2024-08-03 ENCOUNTER — Other Ambulatory Visit: Payer: Self-pay | Admitting: Family Medicine

## 2024-08-06 ENCOUNTER — Other Ambulatory Visit: Payer: Self-pay | Admitting: Family Medicine

## 2024-08-11 DIAGNOSIS — H353112 Nonexudative age-related macular degeneration, right eye, intermediate dry stage: Secondary | ICD-10-CM | POA: Diagnosis not present

## 2024-08-31 DIAGNOSIS — H401121 Primary open-angle glaucoma, left eye, mild stage: Secondary | ICD-10-CM | POA: Diagnosis not present

## 2024-08-31 DIAGNOSIS — H401122 Primary open-angle glaucoma, left eye, moderate stage: Secondary | ICD-10-CM | POA: Diagnosis not present

## 2024-09-03 ENCOUNTER — Other Ambulatory Visit: Payer: Self-pay | Admitting: Family Medicine

## 2024-09-03 DIAGNOSIS — E038 Other specified hypothyroidism: Secondary | ICD-10-CM

## 2024-09-04 NOTE — Progress Notes (Unsigned)
 Subjective:  Patient ID: Sydney Ayala, female    DOB: July 06, 1946  Age: 78 y.o. MRN: [id number]  No chief complaint on file.   HPI: Discussed the use of AI scribe software for clinical note transcription with the patient, who gave verbal consent to proceed.  History of Present Illness        03/01/2024    2:54 PM 06/16/2023    2:34 PM 01/22/2023    2:41 PM 03/18/2022   10:59 AM 11/05/2020   10:18 AM  Depression screen PHQ 2/9  Decreased Interest 0 0 0 0 0  Down, Depressed, Hopeless 0 0 0 0 0  PHQ - 2 Score 0 0 0 0 0  Altered sleeping     0  Tired, decreased energy     0  Change in appetite     0  Feeling bad or failure about yourself      0  Trouble concentrating     0  Moving slowly or fidgety/restless     0  Suicidal thoughts     0  PHQ-9 Score     0        06/16/2023    2:25 PM  Fall Risk   Falls in the past year? 1  Number falls in past yr: 1  Injury with Fall? 0    Patient Care Team: Sherre Clapper, MD as PCP - General (Family Medicine) Florencio Cara BIRCH, MD as Consulting Physician (Cardiology)   Review of Systems  Constitutional:  Negative for chills, fatigue and fever.  HENT:  Negative for congestion, ear pain and sore throat.   Respiratory:  Negative for cough and shortness of breath.   Cardiovascular:  Negative for chest pain.  Gastrointestinal:  Negative for abdominal pain, constipation, diarrhea, nausea and vomiting.  Genitourinary:  Negative for dysuria and urgency.  Musculoskeletal:  Negative for arthralgias and myalgias.  Skin:  Negative for rash.  Neurological:  Negative for dizziness and headaches.  Psychiatric/Behavioral:  Negative for dysphoric mood. The patient is not nervous/anxious.     Current Outpatient Medications on File Prior to Visit  Medication Sig Dispense Refill   acetaminophen  (ACETAMINOPHEN  8 HOUR) 650 MG CR tablet Take 1 tablet (650 mg total) by mouth every 8 (eight) hours as needed for pain. 90 tablet 0   cyanocobalamin  (VITAMIN  B12) 1000 MCG/ML injection INJECT 1 ML MONTHLY 3 mL 1   diltiazem  (TIAZAC ) 120 MG 24 hr capsule Take 1 capsule by mouth daily.     latanoprost (XALATAN) 0.005 % ophthalmic solution SMARTSIG:1 Drop(s) In Eye(s) Every Evening     Multiple Vitamins-Minerals ([employer] SPECTRAVITE ADULT 50+) TABS Take 1 tablet by mouth daily.     naproxen (NAPROSYN) 500 MG tablet TAKE 1 TABLET BY MOUTH EVERY DAY WITH FOOD 30 tablet 6   PARoxetine  (PAXIL ) 10 MG tablet TAKE 1 TABLET BY MOUTH EVERY DAY 90 tablet 0   potassium chloride  SA (KLOR-CON  M20) 20 MEQ tablet Take 1 tablet by mouth daily.     Probiotic Product (DIGESTIVE ADVANTAGE PO) Take 1 each by mouth every other day.     SYNTHROID  125 MCG tablet TAKE 1 TABLET BY MOUTH DAILY BEFORE BREAKFAST. 90 tablet 0   TUBERCULIN SYR 1CC/25GX5/8 (BD SYRINGE SLIP TIP) 25G X 5/8 1 ML MISC To be used monthly for B 12 injection 4 each 3   No current facility-administered medications on file prior to visit.   Past Medical History:  Diagnosis Date   Chronic venous  insufficiency    Dysrhythmia    A Fib during hospital stay 04/03/16   History of rheumatic fever CHILDHOOD   Hypothyroidism    Injury of tendon of long head of right biceps 2018   Pinched nerve in neck    Past Surgical History:  Procedure Laterality Date   ABDOMINAL HYSTERECTOMY     CARDIOVASCULAR STRESS TEST  03/15/2008   NORMAL LVSF/ EF 61%/ NO ISCHEMIA   CATARACT EXTRACTION W/ INTRAOCULAR LENS IMPLANT & ANTERIOR VITRECTOMY, BILATERAL Bilateral 02/2021   COLONOSCOPY WITH PROPOFOL  N/A 04/17/2016   Procedure: COLONOSCOPY WITH PROPOFOL ;  Surgeon: Rogelia Copping, MD;  Location: [facility];  Service: Endoscopy;  Laterality: N/A;   COLONOSCOPY WITH PROPOFOL  N/A 10/29/2023   Procedure: COLONOSCOPY WITH PROPOFOL ;  Surgeon: Copping Rogelia, MD;  Location: [facility];  Service: Endoscopy;  Laterality: N/A;   DILATION AND CURETTAGE OF UTERUS     ESOPHAGOGASTRODUODENOSCOPY (EGD) WITH PROPOFOL  N/A 04/04/2016    Procedure: ESOPHAGOGASTRODUODENOSCOPY (EGD) WITH PROPOFOL ;  Surgeon: Rogelia Copping, MD;  Location: [facility];  Service: Endoscopy;  Laterality: N/A;   GANGLION CYST EXCISION  06/09/2012   Procedure: REMOVAL GANGLION OF WRIST;  Surgeon: Lynwood SHAUNNA Bern, MD;  Location: [facility];  Service: Orthopedics;  Laterality: Left;  EXCISION OF GANGLION CYST LEFT RING FINGER  IV REGIONAL ANESTHESIA   HEMRROIDECTOMY AND ANAL FISSURE REPAIR  12/16/1963   KNEE ARTHROSCOPY WITH MEDIAL MENISECTOMY Left 11/26/2017   Procedure: KNEE ARTHROSCOPY WITH MEDIAL MENISECTOMY;  Surgeon: Edie Norleen PARAS, MD;  Location: [facility];  Service: Orthopedics;  Laterality: Left;   LAPAROSCOPIC CHOLECYSTECTOMY  05/13/2002   Gall Bladder   LEFT BREAST DUCTAL EXCISION  10/11/2009   BENIGN   LEFT HAND SURG.  08/16/1979   EXTENSIVE REPAIR AND REMOVAL OF LIGAMENT/TENDON  (?CANCER)   LUMBAR LAMINECTOMY  1990  &  1976   RIGHT SIDE L4 - 5  & L5 - S1   POLYPECTOMY  04/17/2016   Procedure: POLYPECTOMY;  Surgeon: Rogelia Copping, MD;  Location[facility];  Service: Endoscopy;;   REMOVAL RIGHT GOIN MASS  08/16/1979   BENIGN   REMOVAL TOE NAILBED  AGE 38   ALL 10 TOES-- DEFORMED   RIGHT BREAST LUMPECTOMY     BENIGN   RIGHT FOOT SURG  X3   REPAIR OF 2 TOES   ROTATOR CUFF REPAIR  04/11/2008   LEFT SHOULDER   SPINE SURGERY     THYROID  GOITER REMOVED  08/15/1969   TONSILLECTOMY  CHILD   VAGINAL HYSTERECTOMY/ ANTERIOR & POSTERIOR REPAIR/ TRANSOBTURATOR SLING  04/25/2005   CYSTOCELE/ RECTOCELE/ UTERINE PROLAPSE/ SUI    Family History  Problem Relation Age of Onset   Deep vein thrombosis Mother        Right Leg   Heart disease Mother        Before age 62-  CHF   Varicose Veins Mother    Heart attack Mother    Heart disease Father        Before age 65-  PVD   Heart attack Father    Arthritis Father        Gout   Hypertension Father    Cancer Sister        Thyroid -Back   Cancer Sister        Breast  and Lung   Heart disease Sister    Heart disease Sister    Other Sister        platelet disorder   Cancer Maternal  Grandmother        Breast/Lung Cancer   Colon cancer Maternal Grandmother    Breast cancer Maternal Grandmother    Diabetes Maternal Aunt    Colon cancer Maternal Uncle    Diabetes Maternal Uncle    Heart attack Other        father's side multiple family members   Stroke Other        father's side multiple family members   Social History   Socioeconomic History   Marital status: Divorced    Spouse name: Not on file   Number of children: 3   Years of education: Not on file   Highest education level: 8th grade  Occupational History   Not on file  Tobacco Use   Smoking status: Former    Current packs/day: 0.00    Types: Cigarettes    Start date: 98    Quit date: 1993    Years since quitting: 32.7   Smokeless tobacco: Never  Vaping Use   Vaping status: Never Used  Substance and Sexual Activity   Alcohol use: Never   Drug use: Never   Sexual activity: Not Currently  Other Topics Concern   Not on file  Social History Narrative   Lives alone   Retired   Caffeine: coffee 3 cups daily   Social Drivers of Health   Financial Resource Strain: Low Risk  (03/01/2024)   Overall Financial Resource Strain (CARDIA)    Difficulty of Paying Living Expenses: Not hard at all  Food Insecurity: No Food Insecurity (03/01/2024)   Hunger Vital Sign    Worried About Running Out of Food in the Last Year: Never true    Ran Out of Food in the Last Year: Never true  Transportation Needs: No Transportation Needs (03/01/2024)   PRAPARE - Administrator, Civil Service (Medical): No    Lack of Transportation (Non-Medical): No  Physical Activity: Sufficiently Active (03/01/2024)   Exercise Vital Sign    Days of Exercise per Week: 4 days    Minutes of Exercise per Session: 40 min  Stress: No Stress Concern Present (03/01/2024)   [employer] of Occupational  Health - Occupational Stress Questionnaire    Feeling of Stress : Not at all  Social Connections: Moderately Isolated (03/01/2024)   Social Connection and Isolation Panel    Frequency of Communication with Friends and Family: More than three times a week    Frequency of Social Gatherings with Friends and Family: More than three times a week    Attends Religious Services: 1 to 4 times per year    Active Member of [employer] or Organizations: No    Attends Banker Meetings: Never    Marital Status: Divorced    Objective:  There were no vitals taken for this visit.     03/01/2024    2:50 PM 10/29/2023   10:54 AM 10/29/2023   10:44 AM  BP/Weight  Systolic BP 136 103 98  Diastolic BP 84 56 54  Wt. (Lbs) 164    BMI 30 kg/m2      Physical Exam Vitals reviewed.  Constitutional:      Appearance: Normal appearance. She is normal weight.  Neck:     Vascular: No carotid bruit.  Cardiovascular:     Rate and Rhythm: Normal rate and regular rhythm.     Heart sounds: Normal heart sounds.  Pulmonary:     Effort: Pulmonary effort is normal. No respiratory distress.  Breath sounds: Normal breath sounds.  Abdominal:     General: Abdomen is flat. Bowel sounds are normal.     Palpations: Abdomen is soft.     Tenderness: There is no abdominal tenderness.  Neurological:     Mental Status: She is alert and oriented to person, place, and time.  Psychiatric:        Mood and Affect: Mood normal.        Behavior: Behavior normal.     {Perform Simple Foot Exam  Perform Detailed exam:1} {Insert foot Exam (Optional):30965}   Lab Results  Component Value Date   WBC 7.1 03/01/2024   HGB 13.8 03/01/2024   HCT 43.3 03/01/2024   PLT 214 03/01/2024   GLUCOSE 81 03/01/2024   CHOL 198 03/01/2024   TRIG 106 03/01/2024   HDL 74 03/01/2024   LDLCALC 105 (H) 03/01/2024   ALT 13 03/01/2024   AST 21 03/01/2024   NA 142 03/01/2024   K 4.9 03/01/2024   CL 102 03/01/2024   CREATININE  0.92 03/01/2024   BUN 19 03/01/2024   CO2 25 03/01/2024   TSH 2.960 03/01/2024   INR 1.16 04/03/2016      Assessment & Plan:  There are no diagnoses linked to this encounter.   There is no height or weight on file to calculate BMI.  Assessment and Plan Assessment & Plan      No orders of the defined types were placed in this encounter.   No orders of the defined types were placed in this encounter.      Follow-up: No follow-ups on file.  An After Visit Summary was printed and given to the patient.  Abigail Free, MD [facility] [phone]

## 2024-09-05 ENCOUNTER — Encounter: Payer: Self-pay | Admitting: Family Medicine

## 2024-09-05 ENCOUNTER — Ambulatory Visit (INDEPENDENT_AMBULATORY_CARE_PROVIDER_SITE_OTHER): Admitting: Family Medicine

## 2024-09-05 VITALS — BP 124/70 | HR 68 | Temp 98.0°F | Ht 62.0 in | Wt 164.0 lb

## 2024-09-05 DIAGNOSIS — E039 Hypothyroidism, unspecified: Secondary | ICD-10-CM

## 2024-09-05 DIAGNOSIS — I480 Paroxysmal atrial fibrillation: Secondary | ICD-10-CM

## 2024-09-05 DIAGNOSIS — D518 Other vitamin B12 deficiency anemias: Secondary | ICD-10-CM

## 2024-09-05 DIAGNOSIS — I10 Essential (primary) hypertension: Secondary | ICD-10-CM

## 2024-09-05 DIAGNOSIS — E782 Mixed hyperlipidemia: Secondary | ICD-10-CM

## 2024-09-05 DIAGNOSIS — G250 Essential tremor: Secondary | ICD-10-CM

## 2024-09-05 DIAGNOSIS — R413 Other amnesia: Secondary | ICD-10-CM | POA: Diagnosis not present

## 2024-09-05 DIAGNOSIS — G4709 Other insomnia: Secondary | ICD-10-CM | POA: Diagnosis not present

## 2024-09-05 DIAGNOSIS — Z13820 Encounter for screening for osteoporosis: Secondary | ICD-10-CM | POA: Insufficient documentation

## 2024-09-05 DIAGNOSIS — G609 Hereditary and idiopathic neuropathy, unspecified: Secondary | ICD-10-CM

## 2024-09-05 DIAGNOSIS — Z780 Asymptomatic menopausal state: Secondary | ICD-10-CM

## 2024-09-05 DIAGNOSIS — E538 Deficiency of other specified B group vitamins: Secondary | ICD-10-CM | POA: Diagnosis not present

## 2024-09-05 DIAGNOSIS — Z1231 Encounter for screening mammogram for malignant neoplasm of breast: Secondary | ICD-10-CM | POA: Diagnosis not present

## 2024-09-05 LAB — POCT LIPID PANEL
HDL: 62
LDL: 92
Non-HDL: 110
TC: 172
TRG: 94

## 2024-09-05 NOTE — Patient Instructions (Addendum)
  VISIT SUMMARY: During your visit, we discussed your ongoing eye issues, heart concerns, hand pain, vertigo, memory impairment, insomnia, and other health conditions. We reviewed your current medications and provided recommendations for managing your symptoms.  YOUR PLAN: GLAUCOMA, LEFT EYE: Your glaucoma is stable after your retinal and cataract surgeries. -Continue using Xalatan drops as prescribed. -Follow up with your glaucoma specialist in six months.  PAROXYSMAL ATRIAL FIBRILLATION: Your atrial fibrillation is managed with diltiazem , and you have occasional skipped heartbeats without significant symptoms. -Continue taking diltiazem  120 mg daily. -Follow up with your cardiologist on October 22nd.  OSTEOARTHRITIS OF RIGHT THUMB: You have significant pain and swelling in your right thumb. -Consider surgery if symptoms worsen.  VITAMIN B12 DEFICIENCY ANEMIA: You have a vitamin B12 deficiency managed with monthly supplementation. -Continue monthly B12 supplementation.  VERTIGO: Your vertigo is controlled with Paxil , but you experience occasional dizziness if you miss a dose. -Continue taking Paxil  as prescribed.  MEMORY LOSS: You have difficulty with names and occasional forgetfulness. -We will perform a memory test. -We will order an MRI of your brain.  INSOMNIA: You have chronic insomnia and do not use sleep medications. -We provided education on sleep hygiene.  NEUROPATHY: -consider amitriptyline. Intolerant to gabapentin  and pregabalin.   Ordered mammogram and bone density.

## 2024-09-05 NOTE — Assessment & Plan Note (Addendum)
 Difficulty with names and occasional forgetfulness. Under evaluation. - Perform memory test. - Order MRI of the brain.  Orders:   CBC with Differential/Platelet   Comprehensive metabolic panel with GFR   B12 and Folate Panel   MR Brain Wo Contrast; Future

## 2024-09-05 NOTE — Assessment & Plan Note (Addendum)
 Managed with diltiazem  120 mg daily. Occasional skipped beats, no significant symptoms. - Continue diltiazem  120 mg daily. - Follow up with cardiologist on October 22nd.

## 2024-09-05 NOTE — Assessment & Plan Note (Addendum)
-   at goal.  - Recommend continue to work on eating healthy diet and exercise.  Orders:   POCT Lipid Panel

## 2024-09-05 NOTE — Assessment & Plan Note (Addendum)
-   Check levels.   Orders:   T4, free   TSH

## 2024-09-05 NOTE — Assessment & Plan Note (Addendum)
 Improved. No medicines recommended at this time.

## 2024-09-05 NOTE — Assessment & Plan Note (Signed)
-   check labs.  - consider addition of amitriptyline.

## 2024-09-05 NOTE — Assessment & Plan Note (Addendum)
-   At goal.  - continue diltiazem  120 mg daily.

## 2024-09-05 NOTE — Assessment & Plan Note (Addendum)
 Chronic insomnia, no sleep medications used. Education on sleep hygiene provided. - Provide education on sleep hygiene - Consider amitriptyline.

## 2024-09-05 NOTE — Assessment & Plan Note (Addendum)
-   Order BONE DENSITY. - Continue calcium  with D 600 mg twice daily.  Orders:   DG Bone Density; Future

## 2024-09-05 NOTE — Assessment & Plan Note (Addendum)
  Orders:   MM 3D SCREENING MAMMOGRAM BILATERAL BREAST; Future

## 2024-09-05 NOTE — Assessment & Plan Note (Addendum)
-   check levels. Orders:   B12 and Folate Panel

## 2024-09-06 ENCOUNTER — Ambulatory Visit: Payer: Self-pay | Admitting: Family Medicine

## 2024-09-06 ENCOUNTER — Ambulatory Visit
Admission: RE | Admit: 2024-09-06 | Discharge: 2024-09-06 | Disposition: A | Discharge status: Home / Self Care | Source: Ambulatory Visit | Attending: Family Medicine | Admitting: Family Medicine

## 2024-09-06 DIAGNOSIS — Z1231 Encounter for screening mammogram for malignant neoplasm of breast: Secondary | ICD-10-CM

## 2024-09-06 DIAGNOSIS — R431 Parosmia: Secondary | ICD-10-CM | POA: Diagnosis not present

## 2024-09-06 DIAGNOSIS — R413 Other amnesia: Secondary | ICD-10-CM

## 2024-09-06 LAB — B12 AND FOLATE PANEL
Folate: >20 ng/mL (ref >3.0)
Vitamin B-12: 540 pg/mL (ref 232–1245)

## 2024-09-06 LAB — CBC WITH DIFFERENTIAL/PLATELET
Basophils Absolute: 0 x10E3/uL (ref 0.0–0.2)
Basos: 1 %
EOS (ABSOLUTE): 0.2 x10E3/uL (ref 0.0–0.4)
Eos: 4 %
Hematocrit: 44.6 % (ref 34.0–46.6)
Hemoglobin: 14.2 g/dL (ref 11.1–15.9)
Immature Grans (Abs): 0 x10E3/uL (ref 0.0–0.1)
Immature Granulocytes: 0 %
Lymphocytes Absolute: 2.1 x10E3/uL (ref 0.7–3.1)
Lymphs: 36 %
MCH: 30 pg (ref 26.6–33.0)
MCHC: 31.8 g/dL (ref 31.5–35.7)
MCV: 94 fL (ref 79–97)
Monocytes Absolute: 0.5 x10E3/uL (ref 0.1–0.9)
Monocytes: 8 %
Neutrophils Absolute: 2.9 x10E3/uL (ref 1.4–7.0)
Neutrophils: 51 %
Platelets: 210 x10E3/uL (ref 150–450)
RBC: 4.73 x10E6/uL (ref 3.77–5.28)
RDW: 12.8 % (ref 11.7–15.4)
WBC: 5.7 x10E3/uL (ref 3.4–10.8)

## 2024-09-06 LAB — COMPREHENSIVE METABOLIC PANEL WITH GFR
ALT: 11 IU/L (ref 0–32)
AST: 19 IU/L (ref 0–40)
Albumin: 4.5 g/dL (ref 3.8–4.8)
Alkaline Phosphatase: 108 IU/L (ref 49–135)
BUN/Creatinine Ratio: 28 (ref 12–28)
BUN: 23 mg/dL (ref 8–27)
Bilirubin Total: 0.5 mg/dL (ref 0.0–1.2)
CO2: 23 mmol/L (ref 20–29)
Calcium: 9.8 mg/dL (ref 8.7–10.3)
Chloride: 105 mmol/L (ref 96–106)
Creatinine, Ser: 0.82 mg/dL (ref 0.57–1.00)
Globulin, Total: 2.7 g/dL (ref 1.5–4.5)
Glucose: 88 mg/dL (ref 70–99)
Potassium: 4.7 mmol/L (ref 3.5–5.2)
Sodium: 143 mmol/L (ref 134–144)
Total Protein: 7.2 g/dL (ref 6.0–8.5)
eGFR: 74 mL/min/1.73 (ref >59)

## 2024-09-06 LAB — TSH: TSH: 0.684 u[IU]/mL (ref 0.450–4.500)

## 2024-09-06 LAB — T4, FREE: Free T4: 1.59 ng/dL (ref 0.82–1.77)

## 2024-09-08 ENCOUNTER — Telehealth: Payer: Self-pay | Admitting: Family Medicine

## 2024-09-08 ENCOUNTER — Other Ambulatory Visit: Payer: Self-pay

## 2024-09-08 DIAGNOSIS — E038 Other specified hypothyroidism: Secondary | ICD-10-CM

## 2024-09-08 DIAGNOSIS — F02A0 Dementia in other diseases classified elsewhere, mild, without behavioral disturbance, psychotic disturbance, mood disturbance, and anxiety: Secondary | ICD-10-CM

## 2024-09-08 MED ORDER — DONEPEZIL HCL 5 MG PO TABS: 5.0000 mg | ORAL_TABLET | Freq: Every day | ORAL | 0 refills | Status: AC

## 2024-09-08 MED ORDER — SYNTHROID 125 MCG PO TABS: 125.0000 ug | ORAL_TABLET | Freq: Every day | ORAL | 0 refills | Status: DC

## 2024-09-08 NOTE — Telephone Encounter (Signed)
 Copied from CRM [id number]. Topic: Clinical - Prescription Issue >> Sep 08, 2024 10:23 AM Alfonso ORN wrote: Reason for CRM: pt received the generic brand of the medication that she has been taking for years. She said she is unable to take the generic brand and is requesting the original medication ordered by pcp.  [phone]

## 2024-09-13 ENCOUNTER — Ambulatory Visit: Admitting: Family Medicine

## 2024-09-14 ENCOUNTER — Ambulatory Visit

## 2024-09-14 ENCOUNTER — Other Ambulatory Visit: Payer: Self-pay | Admitting: Family Medicine

## 2024-09-14 ENCOUNTER — Telehealth: Payer: Self-pay

## 2024-09-14 MED ORDER — AMITRIPTYLINE HCL 25 MG PO TABS: 25.0000 mg | ORAL_TABLET | Freq: Every day | ORAL | 0 refills | Status: AC

## 2024-09-14 NOTE — Telephone Encounter (Signed)
 Copied from CRM [id number]. Topic: Clinical - Prescription Issue >> Sep 12, 2024  3:05 PM Zebedee SAUNDERS wrote: Reason for CRM: Pt wants to know if Dr. Sherre sent in medication for Neuropathy. Pt would like a call back at [phone]

## 2024-09-15 ENCOUNTER — Ambulatory Visit

## 2024-09-21 ENCOUNTER — Ambulatory Visit (INDEPENDENT_AMBULATORY_CARE_PROVIDER_SITE_OTHER)

## 2024-09-21 ENCOUNTER — Ambulatory Visit

## 2024-09-21 VITALS — Ht 62.0 in | Wt 155.0 lb

## 2024-09-21 DIAGNOSIS — Z0000 Encounter for general adult medical examination without abnormal findings: Secondary | ICD-10-CM | POA: Diagnosis not present

## 2024-09-21 NOTE — Patient Instructions (Signed)
 Sydney Ayala , Thank you for taking time to come for your Medicare Wellness Visit. I appreciate your ongoing commitment to your health goals. Please review the following plan we discussed and let me know if I can assist you in the future.   These are the goals we discussed:  Goals      DIET - REDUCE SALT INTAKE TO 2 GRAMS PER DAY OR LESS        This is a list of the screening recommended for you and due dates:  Health Maintenance  Topic Date Due   DEXA scan (bone density measurement)  03/27/2024   COVID-19 Vaccine (1 - 2024-25 season) Never done   Medicare Annual Wellness Visit  09/21/2025   Colon Cancer Screening  10/28/2028   DTaP/Tdap/Td vaccine (2 - Td or Tdap) 06/20/2033   Pneumococcal Vaccine for age over 64  Completed   Flu Shot  Completed   Hepatitis C Screening  Completed   Zoster (Shingles) Vaccine  Completed   Meningitis B Vaccine  Aged Out   Breast Cancer Screening  Discontinued    Advanced directives: not at this time , patient is thinking about it.     Next appointment: Follow up in one year for your annual wellness visit 10.14.2025 at 11:00 am.   Preventive Care 65 Years and Older, Female Preventive care refers to lifestyle choices and visits with your health care provider that can promote health and wellness. What does preventive care include? A yearly physical exam. This is also called an annual well check. Dental exams once or twice a year. Routine eye exams. Ask your health care provider how often you should have your eyes checked. Personal lifestyle choices, including: Daily care of your teeth and gums. Regular physical activity. Eating a healthy diet. Avoiding tobacco and drug use. Limiting alcohol use. Practicing safe sex. Taking low-dose aspirin every day. Taking vitamin and mineral supplements as recommended by your health care provider. What happens during an annual well check? The services and screenings done by your health care provider  during your annual well check will depend on your age, overall health, lifestyle risk factors, and family history of disease. Counseling  Your health care provider may ask you questions about your: Alcohol use. Tobacco use. Drug use. Emotional well-being. Home and relationship well-being. Sexual activity. Eating habits. History of falls. Memory and ability to understand (cognition). Work and work Astronomer. Reproductive health. Screening  You may have the following tests or measurements: Height, weight, and BMI. Blood pressure. Lipid and cholesterol levels. These may be checked every 5 years, or more frequently if you are over 62 years old. Skin check. Lung cancer screening. You may have this screening every year starting at age 76 if you have a 30-pack-year history of smoking and currently smoke or have quit within the past 15 years. Fecal occult blood test (FOBT) of the stool. You may have this test every year starting at age 48. Flexible sigmoidoscopy or colonoscopy. You may have a sigmoidoscopy every 5 years or a colonoscopy every 10 years starting at age 61. Hepatitis C blood test. Hepatitis B blood test. Sexually transmitted disease (STD) testing. Diabetes screening. This is done by checking your blood sugar (glucose) after you have not eaten for a while (fasting). You may have this done every 1-3 years. Bone density scan. This is done to screen for osteoporosis. You may have this done starting at age 17. Mammogram. This may be done every 1-2 years. Talk to your  health care provider about how often you should have regular mammograms. Talk with your health care provider about your test results, treatment options, and if necessary, the need for more tests. Vaccines  Your health care provider may recommend certain vaccines, such as: Influenza vaccine. This is recommended every year. Tetanus, diphtheria, and acellular pertussis (Tdap, Td) vaccine. You may need a Td booster every  10 years. Zoster vaccine. You may need this after age 78. Pneumococcal 13-valent conjugate (PCV13) vaccine. One dose is recommended after age 44. Pneumococcal polysaccharide (PPSV23) vaccine. One dose is recommended after age 50. Talk to your health care provider about which screenings and vaccines you need and how often you need them. This information is not intended to replace advice given to you by your health care provider. Make sure you discuss any questions you have with your health care provider. Document Released: 12/28/2015 Document Revised: 08/20/2016 Document Reviewed: 10/02/2015 Elsevier Interactive Patient Education  2017 [employer].  Fall Prevention in the Home Falls can cause injuries. They can happen to people of all ages. There are many things you can do to make your home safe and to help prevent falls. What can I do on the outside of my home? Regularly fix the edges of walkways and driveways and fix any cracks. Remove anything that might make you trip as you walk through a door, such as a raised step or threshold. Trim any bushes or trees on the path to your home. Use bright outdoor lighting. Clear any walking paths of anything that might make someone trip, such as rocks or tools. Regularly check to see if handrails are loose or broken. Make sure that both sides of any steps have handrails. Any raised decks and porches should have guardrails on the edges. Have any leaves, snow, or ice cleared regularly. Use sand or salt on walking paths during winter. Clean up any spills in your garage right away. This includes oil or grease spills. What can I do in the bathroom? Use night lights. Install grab bars by the toilet and in the tub and shower. Do not use towel bars as grab bars. Use non-skid mats or decals in the tub or shower. If you need to sit down in the shower, use a plastic, non-slip stool. Keep the floor dry. Clean up any water  that spills on the floor as soon as it  happens. Remove soap buildup in the tub or shower regularly. Attach bath mats securely with double-sided non-slip rug tape. Do not have throw rugs and other things on the floor that can make you trip. What can I do in the bedroom? Use night lights. Make sure that you have a light by your bed that is easy to reach. Do not use any sheets or blankets that are too big for your bed. They should not hang down onto the floor. Have a firm chair that has side arms. You can use this for support while you get dressed. Do not have throw rugs and other things on the floor that can make you trip. What can I do in the kitchen? Clean up any spills right away. Avoid walking on wet floors. Keep items that you use a lot in easy-to-reach places. If you need to reach something above you, use a strong step stool that has a grab bar. Keep electrical cords out of the way. Do not use floor polish or wax that makes floors slippery. If you must use wax, use non-skid floor wax. Do not have  throw rugs and other things on the floor that can make you trip. What can I do with my stairs? Do not leave any items on the stairs. Make sure that there are handrails on both sides of the stairs and use them. Fix handrails that are broken or loose. Make sure that handrails are as long as the stairways. Check any carpeting to make sure that it is firmly attached to the stairs. Fix any carpet that is loose or worn. Avoid having throw rugs at the top or bottom of the stairs. If you do have throw rugs, attach them to the floor with carpet tape. Make sure that you have a light switch at the top of the stairs and the bottom of the stairs. If you do not have them, ask someone to add them for you. What else can I do to help prevent falls? Wear shoes that: Do not have high heels. Have rubber bottoms. Are comfortable and fit you well. Are closed at the toe. Do not wear sandals. If you use a stepladder: Make sure that it is fully opened.  Do not climb a closed stepladder. Make sure that both sides of the stepladder are locked into place. Ask someone to hold it for you, if possible. Clearly mark and make sure that you can see: Any grab bars or handrails. First and last steps. Where the edge of each step is. Use tools that help you move around (mobility aids) if they are needed. These include: Canes. Walkers. Scooters. Crutches. Turn on the lights when you go into a dark area. Replace any light bulbs as soon as they burn out. Set up your furniture so you have a clear path. Avoid moving your furniture around. If any of your floors are uneven, fix them. If there are any pets around you, be aware of where they are. Review your medicines with your doctor. Some medicines can make you feel dizzy. This can increase your chance of falling. Ask your doctor what other things that you can do to help prevent falls. This information is not intended to replace advice given to you by your health care provider. Make sure you discuss any questions you have with your health care provider. Document Released: 09/27/2009 Document Revised: 05/08/2016 Document Reviewed: 01/05/2015 Elsevier Interactive Patient Education  2017 [employer].

## 2024-09-21 NOTE — Progress Notes (Signed)
 Subjective:   Sydney Ayala is a 78 y.o. female who presents for Medicare Annual (Subsequent) preventive examination.  Visit Complete: Virtual I connected with  Sydney Ayala on 09/21/24 by a audio enabled telemedicine application and verified that I am speaking with the correct person using two identifiers.  Patient Location: Home  Provider Location: Office/Clinic  I discussed the limitations of evaluation and management by telemedicine. The patient expressed understanding and agreed to proceed.  Vital Signs: Because this visit was a virtual/telehealth visit, some criteria may be missing or patient reported. Any vitals not documented were not able to be obtained and vitals that have been documented are patient reported.  Cardiac Risk Factors include: advanced age (>20men, >20 women)     Objective:    Today's Vitals   09/21/24 0956 09/21/24 0958  Weight: 155 lb (70.3 kg)   Height: 5' 2 (1.575 m)   PainSc:  0-No pain   Body mass index is 28.35 kg/m.     09/21/2024   10:11 AM 10/29/2023    9:30 AM 07/07/2019   11:04 AM 01/03/2019   10:21 AM 11/26/2017   11:58 AM 11/20/2017   10:52 AM 11/16/2017    8:10 AM  Advanced Directives  Does Patient Have a Medical Advance Directive? No No No No  No  No  No   Would patient like information on creating a medical advance directive? No - Patient declined No - Patient declined Yes (MAU/Ambulatory/Procedural Areas - Information given)  No - Patient declined  No - Patient declined  No - Patient declined  No - Patient declined      Data saved with a previous flowsheet row definition    Current Medications (verified) Outpatient Encounter Medications as of 09/21/2024  Medication Sig   acetaminophen  (ACETAMINOPHEN  8 HOUR) 650 MG CR tablet Take 1 tablet (650 mg total) by mouth every 8 (eight) hours as needed for pain.   brimonidine (ALPHAGAN) 0.2 % ophthalmic solution Place 1 drop into the left eye in the morning and at bedtime.    cyanocobalamin  (VITAMIN B12) 1000 MCG/ML injection INJECT 1 ML MONTHLY   diltiazem  (TIAZAC ) 120 MG 24 hr capsule Take 1 capsule by mouth daily.   latanoprost (XALATAN) 0.005 % ophthalmic solution SMARTSIG:1 Drop(s) In Eye(s) Every Evening   Multiple Vitamins-Minerals ([employer] SPECTRAVITE ADULT 50+) TABS Take 1 tablet by mouth daily.   naproxen (NAPROSYN) 500 MG tablet TAKE 1 TABLET BY MOUTH EVERY DAY WITH FOOD   PARoxetine  (PAXIL ) 10 MG tablet TAKE 1 TABLET BY MOUTH EVERY DAY   potassium chloride  SA (KLOR-CON  M20) 20 MEQ tablet Take 1 tablet by mouth daily.   Probiotic Product (DIGESTIVE ADVANTAGE PO) Take 1 each by mouth every other day.   SYNTHROID  125 MCG tablet Take 1 tablet (125 mcg total) by mouth daily before breakfast.   TUBERCULIN SYR 1CC/25GX5/8 (BD SYRINGE SLIP TIP) 25G X 5/8 1 ML MISC To be used monthly for B 12 injection   amitriptyline (ELAVIL) 25 MG tablet Take 1 tablet (25 mg total) by mouth at bedtime. (Patient not taking: Reported on 09/21/2024)   donepezil  (ARICEPT ) 5 MG tablet Take 1 tablet (5 mg total) by mouth at bedtime. (Patient not taking: Reported on 09/21/2024)   UNABLE TO FIND Med Name: brain Health (Patient not taking: Reported on 09/21/2024)   No facility-administered encounter medications on file as of 09/21/2024.    Allergies (verified) Latex, Codeine, Contrast media [iodinated contrast media], Gabapentin , Indomethacin, Lyrica [pregabalin], and Adhesive [  tape]   History: Past Medical History:  Diagnosis Date   Chronic venous insufficiency    Dysrhythmia    A Fib during hospital stay 04/03/16   History of rheumatic fever CHILDHOOD   Hypothyroidism    Injury of tendon of long head of right biceps 2018   Pinched nerve in neck    Past Surgical History:  Procedure Laterality Date   ABDOMINAL HYSTERECTOMY     CARDIOVASCULAR STRESS TEST  03/15/2008   NORMAL LVSF/ EF 61%/ NO ISCHEMIA   CATARACT EXTRACTION W/ INTRAOCULAR LENS IMPLANT & ANTERIOR VITRECTOMY,  BILATERAL Bilateral 02/2021   COLONOSCOPY WITH PROPOFOL  N/A 04/17/2016   Procedure: COLONOSCOPY WITH PROPOFOL ;  Surgeon: Rogelia Copping, MD;  Location: [facility];  Service: Endoscopy;  Laterality: N/A;   COLONOSCOPY WITH PROPOFOL  N/A 10/29/2023   Procedure: COLONOSCOPY WITH PROPOFOL ;  Surgeon: Copping Rogelia, MD;  Location: [facility];  Service: Endoscopy;  Laterality: N/A;   DILATION AND CURETTAGE OF UTERUS     ESOPHAGOGASTRODUODENOSCOPY (EGD) WITH PROPOFOL  N/A 04/04/2016   Procedure: ESOPHAGOGASTRODUODENOSCOPY (EGD) WITH PROPOFOL ;  Surgeon: Rogelia Copping, MD;  Location: [facility];  Service: Endoscopy;  Laterality: N/A;   GANGLION CYST EXCISION  06/09/2012   Procedure: REMOVAL GANGLION OF WRIST;  Surgeon: Lynwood SHAUNNA Bern, MD;  Location: [facility];  Service: Orthopedics;  Laterality: Left;  EXCISION OF GANGLION CYST LEFT RING FINGER  IV REGIONAL ANESTHESIA   HEMRROIDECTOMY AND ANAL FISSURE REPAIR  12/16/1963   KNEE ARTHROSCOPY WITH MEDIAL MENISECTOMY Left 11/26/2017   Procedure: KNEE ARTHROSCOPY WITH MEDIAL MENISECTOMY;  Surgeon: Edie Norleen PARAS, MD;  Location: [facility];  Service: Orthopedics;  Laterality: Left;   LAPAROSCOPIC CHOLECYSTECTOMY  05/13/2002   Gall Bladder   LEFT BREAST DUCTAL EXCISION  10/11/2009   BENIGN   LEFT HAND SURG.  08/16/1979   EXTENSIVE REPAIR AND REMOVAL OF LIGAMENT/TENDON  (?CANCER)   LUMBAR LAMINECTOMY  1990  &  1976   RIGHT SIDE L4 - 5  & L5 - S1   POLYPECTOMY  04/17/2016   Procedure: POLYPECTOMY;  Surgeon: Rogelia Copping, MD;  Location: [facility];  Service: Endoscopy;;   REMOVAL RIGHT GOIN MASS  08/16/1979   BENIGN   REMOVAL TOE NAILBED  AGE 70   ALL 10 TOES-- DEFORMED   RIGHT BREAST LUMPECTOMY     BENIGN   RIGHT FOOT SURG  X3   REPAIR OF 2 TOES   ROTATOR CUFF REPAIR  04/11/2008   LEFT SHOULDER   SPINE SURGERY     THYROID  GOITER REMOVED  08/15/1969   TONSILLECTOMY  CHILD   VAGINAL HYSTERECTOMY/ ANTERIOR & POSTERIOR  REPAIR/ TRANSOBTURATOR SLING  04/25/2005   CYSTOCELE/ RECTOCELE/ UTERINE PROLAPSE/ SUI   Family History  Problem Relation Age of Onset   Deep vein thrombosis Mother        Right Leg   Heart disease Mother        Before age 40-  CHF   Varicose Veins Mother    Heart attack Mother    Heart disease Father        Before age 73-  PVD   Heart attack Father    Arthritis Father        Gout   Hypertension Father    Cancer Sister        Thyroid -Back   Cancer Sister        Breast and Lung   Heart disease Sister    Heart disease Sister    Other Sister  platelet disorder   Cancer Maternal Grandmother        Breast/Lung Cancer   Colon cancer Maternal Grandmother    Breast cancer Maternal Grandmother    Diabetes Maternal Aunt    Colon cancer Maternal Uncle    Diabetes Maternal Uncle    Heart attack Other        father's side multiple family members   Stroke Other        father's side multiple family members   Social History   Socioeconomic History   Marital status: Divorced    Spouse name: Not on file   Number of children: 3   Years of education: Not on file   Highest education level: 8th grade  Occupational History   Not on file  Tobacco Use   Smoking status: Former    Current packs/day: 0.00    Types: Cigarettes    Start date: 14    Quit date: 1993    Years since quitting: 32.7   Smokeless tobacco: Never  Vaping Use   Vaping status: Never Used  Substance and Sexual Activity   Alcohol use: Never   Drug use: Never   Sexual activity: Not Currently  Other Topics Concern   Not on file  Social History Narrative   Lives alone   Retired   Caffeine: coffee 3 cups daily   Social Drivers of Health   Financial Resource Strain: Low Risk  (03/01/2024)   Overall Financial Resource Strain (CARDIA)    Difficulty of Paying Living Expenses: Not hard at all  Food Insecurity: No Food Insecurity (03/01/2024)   Hunger Vital Sign    Worried About Running Out of Food in the  Last Year: Never true    Ran Out of Food in the Last Year: Never true  Transportation Needs: No Transportation Needs (03/01/2024)   PRAPARE - Administrator, Civil Service (Medical): No    Lack of Transportation (Non-Medical): No  Physical Activity: Sufficiently Active (03/01/2024)   Exercise Vital Sign    Days of Exercise per Week: 4 days    Minutes of Exercise per Session: 40 min  Stress: No Stress Concern Present (03/01/2024)   [employer] of Occupational Health - Occupational Stress Questionnaire    Feeling of Stress : Not at all  Social Connections: Moderately Isolated (03/01/2024)   Social Connection and Isolation Panel    Frequency of Communication with Friends and Family: More than three times a week    Frequency of Social Gatherings with Friends and Family: More than three times a week    Attends Religious Services: 1 to 4 times per year    Active Member of [employer] or Organizations: No    Attends Engineer, structural: Never    Marital Status: Divorced    Tobacco Counseling Counseling given: Not Answered   Clinical Intake:  Pre-visit preparation completed: No  Pain : No/denies pain Pain Score: 0-No pain     BMI - recorded: 28.65 Nutritional Status: BMI 25 -29 Overweight Nutritional Risks: None Diabetes: No  How often do you need to have someone help you when you read instructions, pamphlets, or other written materials from your doctor or pharmacy?: 1 - Never What is the last grade level you completed in school?: 7th grade  Interpreter Needed?: No      Activities of Daily Living    09/21/2024   10:14 AM  In your present state of health, do you have any difficulty performing the following activities:  Hearing? 0  Vision? 0  Comment wear glasses  Difficulty concentrating or making decisions? 0  Walking or climbing stairs? 0  Dressing or bathing? 0  Doing errands, shopping? 0  Preparing Food and eating ? N  Using the Toilet? N  In  the past six months, have you accidently leaked urine? Y  Comment patient stated she wears a pad daily.  Do you have problems with loss of bowel control? N  Managing your Medications? N  Managing your Finances? N  Housekeeping or managing your Housekeeping? N    Patient Care Team: Sherre Clapper, MD as PCP - General (Family Medicine) Florencio Cara BIRCH, MD as Consulting Physician (Cardiology)  Indicate any recent Medical Services you may have received from other than [facility] providers in the past year (date may be approximate).     Assessment:   This is a routine wellness examination for Sydney Ayala.  Hearing/Vision screen No results found.   Goals Addressed   None    Depression Screen    09/21/2024   10:12 AM 03/01/2024    2:54 PM 06/16/2023    2:34 PM 01/22/2023    2:41 PM 03/18/2022   10:59 AM 11/05/2020   10:18 AM 03/01/2020   11:01 AM  PHQ 2/9 Scores  PHQ - 2 Score 0 0 0 0 0 0 0  PHQ- 9 Score 0     0     Fall Risk    09/21/2024   10:12 AM 06/16/2023    2:25 PM 01/22/2023    2:41 PM 05/28/2021   10:27 AM 03/01/2020   11:01 AM  Fall Risk   Falls in the past year? 0 1 0 0 1   Number falls in past yr: 0 1 0 0 1  Injury with Fall? 0 0 0 0 0  Risk for fall due to : No Fall Risks  No Fall Risks Impaired balance/gait Impaired balance/gait  Follow up Falls evaluation completed  Falls evaluation completed  Falls evaluation completed      Data saved with a previous flowsheet row definition    MEDICARE RISK AT HOME: Medicare Risk at Home Any stairs in or around the home?: Yes If so, are there any without handrails?: No Home free of loose throw rugs in walkways, pet beds, electrical cords, etc?: No (has a throw up but going to take up.) Adequate lighting in your home to reduce risk of falls?: Yes Life alert?: No Use of a cane, walker or w/c?: No Grab bars in the bathroom?: No Shower chair or bench in shower?: Yes Elevated toilet seat or a handicapped toilet?: Yes  Cognitive  Function:        09/21/2024   10:16 AM 09/05/2024   10:11 AM 06/16/2023    3:23 PM  6CIT Screen  What Year? 0 points 0 points 0 points  What month? 0 points 0 points 0 points  What time? 0 points 0 points 0 points  Count back from 20 0 points 0 points 0 points  Months in reverse 0 points 0 points 2 points  Repeat phrase 2 points 2 points 4 points  Total Score 2 points 2 points 6 points    Immunizations Immunization History  Administered Date(s) Administered    sv, Bivalent, Protein Subunit Rsvpref,pf Mar[place]) 09/11/2024   INFLUENZA, HIGH DOSE SEASONAL PF 12/25/2017, 10/11/2018, 08/23/2024   Influenza-Unspecified 12/25/2022, 12/25/2023   PNEUMOCOCCAL CONJUGATE-20 01/22/2023   Pneumococcal Conjugate-13 10/11/2014, 11/22/2018   Tdap 06/21/2023   Zoster Recombinant(Shingrix)  08/31/2023, 12/25/2023    TDAP status: Up to date  Flu Vaccine status: Up to date  Pneumococcal vaccine status: Up to date  Covid-19 vaccine status: Declined, Education has been provided regarding the importance of this vaccine but patient still declined. Advised may receive this vaccine at local pharmacy or Health Dept.or vaccine clinic. Aware to provide a copy of the vaccination record if obtained from local pharmacy or Health Dept. Verbalized acceptance and understanding.  Qualifies for Shingles Vaccine? Yes   Zostavax completed Yes   Shingrix Completed?: Yes  Screening Tests Health Maintenance  Topic Date Due   DEXA SCAN  03/27/2024   Medicare Annual Wellness (AWV)  06/15/2024   COVID-19 Vaccine (1 - 2024-25 season) Never done   Colonoscopy  10/28/2028   DTaP/Tdap/Td (2 - Td or Tdap) 06/20/2033   Pneumococcal Vaccine: 50+ Years  Completed   Influenza Vaccine  Completed   Hepatitis C Screening  Completed   Zoster Vaccines- Shingrix  Completed   Meningococcal B Vaccine  Aged Out   Mammogram  Discontinued    Health Maintenance  Health Maintenance Due  Topic Date Due   DEXA SCAN  03/27/2024    Medicare Annual Wellness (AWV)  06/15/2024   COVID-19 Vaccine (1 - 2024-25 season) Never done    Colorectal cancer screening: Type of screening: Colonoscopy. Completed 10/29/2023. Repeat every 5 years  Mammogram status: Completed 09/06/2024. Repeat every year  Bone Density status: Ordered 09/05/2024. Pt provided with contact info and advised to call to schedule appt.   Additional Screening:  Hepatitis C Screening: does qualify; Completed 04.26.2017.  Vision Screening: Recommended annual ophthalmology exams for early detection of glaucoma and other disorders of the eye. Is the patient up to date with their annual eye exam?  Yes  Who is the provider or what is the name of the office in which the patient attends annual eye exams? Dr. Malvina  Dental Screening: Recommended annual dental exams for proper oral hygiene   Community Resource Referral / Chronic Care Management: CRR required this visit?  No   CCM required this visit?  No     Plan:     I have personally reviewed and noted the following in the patient's chart:   Medical and social history Use of alcohol, tobacco or illicit drugs  Current medications and supplements including opioid prescriptions. Patient is currently taking opioid prescriptions. Information provided to patient regarding non-opioid alternatives. Patient advised to discuss non-opioid treatment plan with their provider. Functional ability and status Nutritional status Physical activity Advanced directives List of other physicians Hospitalizations, surgeries, and ER visits in previous 12 months Vitals Screenings to include cognitive, depression, and falls Referrals and appointments  In addition, I have reviewed and discussed with patient certain preventive protocols, quality metrics, and best practice recommendations. A written personalized care plan for preventive services as well as general preventive health recommendations were provided to  patient.     Ethen Bannan [place], [place]   09/21/2024   After Visit Summary: (Declined) Due to this being a telephonic visit, with patients personalized plan was offered to patient but patient Declined AVS at this time   Nurse Notes: I spent 32 minutes on the phone with patient for her Annual wellness visit. Patient is doing well and has no concerns.

## 2024-10-18 ENCOUNTER — Other Ambulatory Visit: Payer: Self-pay | Admitting: Internal Medicine

## 2024-10-18 DIAGNOSIS — I10 Essential (primary) hypertension: Secondary | ICD-10-CM

## 2024-10-18 DIAGNOSIS — F0390 Unspecified dementia without behavioral disturbance: Secondary | ICD-10-CM

## 2024-10-18 DIAGNOSIS — I480 Paroxysmal atrial fibrillation: Secondary | ICD-10-CM

## 2024-11-04 ENCOUNTER — Telehealth (HOSPITAL_COMMUNITY): Payer: Self-pay | Admitting: *Deleted

## 2024-11-04 NOTE — Telephone Encounter (Signed)
 Attempted to call patient regarding upcoming cardiac CT appointment. Left message on voicemail with name and callback number [name] RN Navigator Cardiac Imaging [facility] [phone] Office

## 2024-11-04 NOTE — Telephone Encounter (Signed)
 Reaching out to patient to offer assistance regarding upcoming cardiac imaging study; patient requested to reschedule.  Appt rescheduled. [name] RN Navigator Cardiac Imaging [facility] [phone] office [phone] cell

## 2024-11-06 ENCOUNTER — Other Ambulatory Visit: Payer: Self-pay | Admitting: Family Medicine

## 2024-11-07 ENCOUNTER — Ambulatory Visit

## 2024-11-12 NOTE — Procedures (Signed)
 72-hour Holter Indication paroxysmal atrial fibrillation Hookup date October 17, 2025 2025 Patient wore the monitor for about 3 days  Total 233,552 Minimum rate 32 maximum 113 average 64 Mostly sinus rhythm Very very rare PVCs less than 1% burden Rare PACs less than 1% burden No runs No pauses No high-grade blocks No ST segment changes No diary submitted No evidence of atrial fibrillation  Conclusion Benign Holter No evidence of atrial fibrillation

## 2024-11-25 ENCOUNTER — Telehealth (HOSPITAL_COMMUNITY): Payer: Self-pay | Admitting: *Deleted

## 2024-11-25 NOTE — Telephone Encounter (Signed)
 Attempted to call patient regarding upcoming cardiac CT appointment. Left message on voicemail with name and callback number [name] RN Navigator Cardiac Imaging [facility] [phone] Office

## 2024-11-25 NOTE — Telephone Encounter (Signed)
 Reaching out to patient to offer assistance regarding upcoming cardiac imaging study; pt verbalizes understanding of appt date/time, parking situation and where to check in, pre-test NPO status and medications ordered, and verified current allergies; name and call back number provided for further questions should they arise Johney Frame RN Navigator Cardiac Imaging [facility] [phone] office [phone] cell  Patient verbalized understanding of allergy prep.

## 2024-11-28 ENCOUNTER — Other Ambulatory Visit (HOSPITAL_COMMUNITY): Payer: Self-pay | Admitting: Emergency Medicine

## 2024-11-28 ENCOUNTER — Ambulatory Visit
Admission: RE | Admit: 2024-11-28 | Discharge: 2024-11-28 | Disposition: A | Source: Ambulatory Visit | Attending: Internal Medicine | Admitting: Internal Medicine

## 2024-11-28 ENCOUNTER — Encounter (HOSPITAL_COMMUNITY): Payer: Self-pay | Admitting: Emergency Medicine

## 2024-11-28 DIAGNOSIS — I10 Essential (primary) hypertension: Secondary | ICD-10-CM

## 2024-11-28 DIAGNOSIS — F0390 Unspecified dementia without behavioral disturbance: Secondary | ICD-10-CM

## 2024-11-28 DIAGNOSIS — I480 Paroxysmal atrial fibrillation: Secondary | ICD-10-CM

## 2024-11-28 MED ORDER — PREDNISONE 50 MG PO TABS: ORAL_TABLET | ORAL | 0 refills | Status: DC

## 2024-11-28 MED ORDER — METOPROLOL TARTRATE 25 MG PO TABS: 25.0000 mg | ORAL_TABLET | Freq: Once | ORAL | 0 refills | Status: DC

## 2024-11-28 MED ORDER — METOPROLOL TARTRATE 5 MG/5ML IV SOLN: 10.0000 mg | Freq: Once | INTRAVENOUS | Status: DC | PRN

## 2024-11-28 MED ORDER — METOPROLOL TARTRATE 25 MG PO TABS: 25.0000 mg | ORAL_TABLET | Freq: Once | ORAL | 0 refills | Status: AC

## 2024-11-28 MED ORDER — NITROGLYCERIN 0.4 MG SL SUBL
0.8000 mg | SUBLINGUAL_TABLET | Freq: Once | SUBLINGUAL | Status: DC
  Filled 2024-11-28: qty 25

## 2024-11-28 MED ORDER — DIPHENHYDRAMINE HCL 50 MG PO TABS: ORAL_TABLET | ORAL | 0 refills | Status: DC

## 2024-11-28 MED ORDER — PREDNISONE 50 MG PO TABS: ORAL_TABLET | ORAL | 0 refills | Status: AC

## 2024-11-28 MED ORDER — DILTIAZEM HCL 25 MG/5ML IV SOLN: 10.0000 mg | INTRAVENOUS | Status: DC | PRN

## 2024-11-28 MED ORDER — DIPHENHYDRAMINE HCL 50 MG PO TABS: ORAL_TABLET | ORAL | 0 refills | Status: AC

## 2024-12-06 ENCOUNTER — Other Ambulatory Visit: Payer: Self-pay | Admitting: Family Medicine

## 2024-12-06 ENCOUNTER — Ambulatory Visit: Admitting: Family Medicine

## 2024-12-06 DIAGNOSIS — E038 Other specified hypothyroidism: Secondary | ICD-10-CM

## 2024-12-16 ENCOUNTER — Telehealth (HOSPITAL_COMMUNITY): Payer: Self-pay | Admitting: *Deleted

## 2024-12-16 NOTE — Telephone Encounter (Signed)
 Attempted to call patient regarding upcoming cardiac CT appointment. Left message on voicemail with name and callback number  Larey Brick RN Navigator Cardiac Imaging [facility] [phone] Office [phone] Cell

## 2024-12-19 ENCOUNTER — Ambulatory Visit
Admission: RE | Admit: 2024-12-19 | Discharge: 2024-12-19 | Disposition: A | Discharge status: Home / Self Care | Source: Ambulatory Visit | Attending: Internal Medicine | Admitting: Internal Medicine

## 2024-12-19 DIAGNOSIS — F0390 Unspecified dementia without behavioral disturbance: Secondary | ICD-10-CM | POA: Insufficient documentation

## 2024-12-19 DIAGNOSIS — I10 Essential (primary) hypertension: Secondary | ICD-10-CM | POA: Diagnosis present

## 2024-12-19 DIAGNOSIS — I480 Paroxysmal atrial fibrillation: Secondary | ICD-10-CM | POA: Diagnosis present

## 2024-12-19 MED ORDER — IOHEXOL 350 MG/ML SOLN
100.0000 mL | Freq: Once | INTRAVENOUS | Status: AC | PRN
  Administered 2024-12-19: 100 mL via INTRAVENOUS

## 2024-12-19 MED ORDER — DILTIAZEM HCL 25 MG/5ML IV SOLN: 10.0000 mg | INTRAVENOUS | Status: DC | PRN

## 2024-12-19 MED ORDER — METOPROLOL TARTRATE 5 MG/5ML IV SOLN
INTRAVENOUS | Status: AC
  Filled 2024-12-19: qty 10

## 2024-12-19 MED ORDER — METOPROLOL TARTRATE 5 MG/5ML IV SOLN: 10.0000 mg | Freq: Once | INTRAVENOUS | Status: DC | PRN

## 2024-12-19 MED ORDER — NITROGLYCERIN 0.4 MG SL SUBL
0.8000 mg | SUBLINGUAL_TABLET | Freq: Once | SUBLINGUAL | Status: AC
  Administered 2024-12-19: 0.8 mg via SUBLINGUAL
  Filled 2024-12-19: qty 25

## 2024-12-19 NOTE — Progress Notes (Signed)
 Pt tolerated cardiac CT well. VS stable. Encouraged to drink plenty of fluids. Pt verbalized understanding.

## 2025-03-03 ENCOUNTER — Ambulatory Visit: Admitting: Family Medicine

## 2025-09-27 ENCOUNTER — Ambulatory Visit
